# Patient Record
Sex: Male | Born: 1955 | Race: White | Hispanic: No | Marital: Single | Smoking: Former smoker
Health system: Southern US, Community
[De-identification: ages and names within clinical notes are randomized; demographics above are authoritative.]

## PROBLEM LIST (undated history)

## (undated) DIAGNOSIS — K429 Umbilical hernia without obstruction or gangrene: Secondary | ICD-10-CM

## (undated) DIAGNOSIS — J449 Chronic obstructive pulmonary disease, unspecified: Secondary | ICD-10-CM

## (undated) DIAGNOSIS — I10 Essential (primary) hypertension: Secondary | ICD-10-CM

---

## 1998-04-30 ENCOUNTER — Encounter (HOSPITAL_COMMUNITY): Admission: RE | Admit: 1998-04-30 | Discharge: 1998-07-29 | Payer: Self-pay | Admitting: Psychiatry

## 2009-01-01 ENCOUNTER — Emergency Department: Payer: Self-pay | Admitting: Emergency Medicine

## 2012-09-14 ENCOUNTER — Ambulatory Visit: Payer: Self-pay | Admitting: Unknown Physician Specialty

## 2012-09-19 LAB — PATHOLOGY REPORT

## 2015-08-24 ENCOUNTER — Other Ambulatory Visit: Payer: Self-pay | Admitting: Specialist

## 2015-08-24 DIAGNOSIS — J439 Emphysema, unspecified: Secondary | ICD-10-CM

## 2015-08-24 DIAGNOSIS — Z720 Tobacco use: Secondary | ICD-10-CM

## 2015-08-24 DIAGNOSIS — R0609 Other forms of dyspnea: Secondary | ICD-10-CM

## 2015-09-01 ENCOUNTER — Ambulatory Visit: Payer: BLUE CROSS/BLUE SHIELD

## 2015-09-11 ENCOUNTER — Ambulatory Visit
Admission: RE | Admit: 2015-09-11 | Discharge: 2015-09-11 | Disposition: A | Discharge status: Home / Self Care | Payer: BLUE CROSS/BLUE SHIELD | Source: Ambulatory Visit | Attending: Specialist | Admitting: Specialist

## 2015-09-11 DIAGNOSIS — J439 Emphysema, unspecified: Secondary | ICD-10-CM | POA: Insufficient documentation

## 2015-09-11 DIAGNOSIS — R0602 Shortness of breath: Secondary | ICD-10-CM | POA: Insufficient documentation

## 2015-09-11 DIAGNOSIS — Z720 Tobacco use: Secondary | ICD-10-CM

## 2015-09-11 DIAGNOSIS — J849 Interstitial pulmonary disease, unspecified: Secondary | ICD-10-CM | POA: Insufficient documentation

## 2015-09-11 DIAGNOSIS — I2510 Atherosclerotic heart disease of native coronary artery without angina pectoris: Secondary | ICD-10-CM | POA: Insufficient documentation

## 2015-09-11 DIAGNOSIS — R0609 Other forms of dyspnea: Secondary | ICD-10-CM

## 2015-09-11 DIAGNOSIS — I700 Atherosclerosis of aorta: Secondary | ICD-10-CM | POA: Diagnosis not present

## 2015-09-11 DIAGNOSIS — K760 Fatty (change of) liver, not elsewhere classified: Secondary | ICD-10-CM | POA: Insufficient documentation

## 2015-12-28 ENCOUNTER — Ambulatory Visit: Payer: BLUE CROSS/BLUE SHIELD | Attending: Otolaryngology

## 2015-12-28 DIAGNOSIS — G4733 Obstructive sleep apnea (adult) (pediatric): Secondary | ICD-10-CM | POA: Insufficient documentation

## 2015-12-28 DIAGNOSIS — G4710 Hypersomnia, unspecified: Secondary | ICD-10-CM | POA: Diagnosis not present

## 2015-12-28 DIAGNOSIS — G4761 Periodic limb movement disorder: Secondary | ICD-10-CM | POA: Insufficient documentation

## 2015-12-28 DIAGNOSIS — R0683 Snoring: Secondary | ICD-10-CM | POA: Insufficient documentation

## 2020-05-31 ENCOUNTER — Other Ambulatory Visit: Payer: Self-pay

## 2020-05-31 ENCOUNTER — Encounter: Payer: Self-pay | Admitting: Emergency Medicine

## 2020-05-31 ENCOUNTER — Inpatient Hospital Stay
Admission: EM | Admit: 2020-05-31 | Discharge: 2020-06-05 | DRG: 433 | Disposition: A | Discharge status: Home / Self Care | Payer: Medicare Other | Attending: Family Medicine | Admitting: Family Medicine

## 2020-05-31 ENCOUNTER — Emergency Department: Payer: Medicare Other

## 2020-05-31 DIAGNOSIS — Z87891 Personal history of nicotine dependence: Secondary | ICD-10-CM

## 2020-05-31 DIAGNOSIS — I10 Essential (primary) hypertension: Secondary | ICD-10-CM

## 2020-05-31 DIAGNOSIS — R188 Other ascites: Secondary | ICD-10-CM | POA: Diagnosis present

## 2020-05-31 DIAGNOSIS — M810 Age-related osteoporosis without current pathological fracture: Secondary | ICD-10-CM | POA: Diagnosis present

## 2020-05-31 DIAGNOSIS — K7460 Unspecified cirrhosis of liver: Secondary | ICD-10-CM | POA: Diagnosis not present

## 2020-05-31 DIAGNOSIS — R0602 Shortness of breath: Secondary | ICD-10-CM

## 2020-05-31 DIAGNOSIS — J9611 Chronic respiratory failure with hypoxia: Secondary | ICD-10-CM | POA: Diagnosis present

## 2020-05-31 DIAGNOSIS — J449 Chronic obstructive pulmonary disease, unspecified: Secondary | ICD-10-CM | POA: Diagnosis not present

## 2020-05-31 DIAGNOSIS — K766 Portal hypertension: Secondary | ICD-10-CM | POA: Diagnosis present

## 2020-05-31 DIAGNOSIS — Z9189 Other specified personal risk factors, not elsewhere classified: Secondary | ICD-10-CM

## 2020-05-31 DIAGNOSIS — K429 Umbilical hernia without obstruction or gangrene: Secondary | ICD-10-CM | POA: Diagnosis present

## 2020-05-31 DIAGNOSIS — Z86010 Personal history of colonic polyps: Secondary | ICD-10-CM

## 2020-05-31 DIAGNOSIS — E8770 Fluid overload, unspecified: Secondary | ICD-10-CM | POA: Diagnosis present

## 2020-05-31 DIAGNOSIS — R109 Unspecified abdominal pain: Secondary | ICD-10-CM | POA: Diagnosis not present

## 2020-05-31 DIAGNOSIS — E876 Hypokalemia: Secondary | ICD-10-CM | POA: Diagnosis present

## 2020-05-31 DIAGNOSIS — Z8719 Personal history of other diseases of the digestive system: Secondary | ICD-10-CM

## 2020-05-31 DIAGNOSIS — Z6839 Body mass index (BMI) 39.0-39.9, adult: Secondary | ICD-10-CM

## 2020-05-31 DIAGNOSIS — E669 Obesity, unspecified: Secondary | ICD-10-CM | POA: Diagnosis present

## 2020-05-31 DIAGNOSIS — Z79899 Other long term (current) drug therapy: Secondary | ICD-10-CM

## 2020-05-31 DIAGNOSIS — K5900 Constipation, unspecified: Secondary | ICD-10-CM | POA: Diagnosis present

## 2020-05-31 DIAGNOSIS — Z20822 Contact with and (suspected) exposure to covid-19: Secondary | ICD-10-CM | POA: Diagnosis present

## 2020-05-31 DIAGNOSIS — M7989 Other specified soft tissue disorders: Secondary | ICD-10-CM

## 2020-05-31 DIAGNOSIS — R140 Abdominal distension (gaseous): Secondary | ICD-10-CM

## 2020-05-31 DIAGNOSIS — Z882 Allergy status to sulfonamides status: Secondary | ICD-10-CM

## 2020-05-31 DIAGNOSIS — K760 Fatty (change of) liver, not elsewhere classified: Secondary | ICD-10-CM | POA: Diagnosis present

## 2020-05-31 HISTORY — DX: Chronic obstructive pulmonary disease, unspecified: J44.9

## 2020-05-31 HISTORY — DX: Essential (primary) hypertension: I10

## 2020-05-31 HISTORY — DX: Umbilical hernia without obstruction or gangrene: K42.9

## 2020-05-31 LAB — CBC
HCT: 44.5 % (ref 39.0–52.0)
Hemoglobin: 14.5 g/dL (ref 13.0–17.0)
MCH: 28.9 pg (ref 26.0–34.0)
MCHC: 32.6 g/dL (ref 30.0–36.0)
MCV: 88.8 fL (ref 80.0–100.0)
Platelets: 270 10*3/uL (ref 150–400)
RBC: 5.01 MIL/uL (ref 4.22–5.81)
RDW: 13.6 % (ref 11.5–15.5)
WBC: 9.8 10*3/uL (ref 4.0–10.5)
nRBC: 0 % (ref 0.0–0.2)

## 2020-05-31 LAB — COMPREHENSIVE METABOLIC PANEL
ALT: 61 U/L — ABNORMAL HIGH (ref 0–44)
AST: 39 U/L (ref 15–41)
Albumin: 3.6 g/dL (ref 3.5–5.0)
Alkaline Phosphatase: 66 U/L (ref 38–126)
Anion gap: 14 (ref 5–15)
BUN: 14 mg/dL (ref 8–23)
CO2: 33 mmol/L — ABNORMAL HIGH (ref 22–32)
Calcium: 8.9 mg/dL (ref 8.9–10.3)
Chloride: 90 mmol/L — ABNORMAL LOW (ref 98–111)
Creatinine, Ser: 0.82 mg/dL (ref 0.61–1.24)
GFR, Estimated: >60 mL/min (ref >60)
Glucose, Bld: 104 mg/dL — ABNORMAL HIGH (ref 70–99)
Potassium: 4.1 mmol/L (ref 3.5–5.1)
Sodium: 137 mmol/L (ref 135–145)
Total Bilirubin: 1.5 mg/dL — ABNORMAL HIGH (ref 0.3–1.2)
Total Protein: 7.2 g/dL (ref 6.5–8.1)

## 2020-05-31 LAB — URINALYSIS, COMPLETE (UACMP) WITH MICROSCOPIC
Bacteria, UA: NONE SEEN
Bilirubin Urine: NEGATIVE
Glucose, UA: NEGATIVE mg/dL
Hgb urine dipstick: NEGATIVE
Ketones, ur: 80 mg/dL — AB
Leukocytes,Ua: NEGATIVE
Nitrite: NEGATIVE
Protein, ur: NEGATIVE mg/dL
Specific Gravity, Urine: >1.046 — ABNORMAL HIGH (ref 1.005–1.030)
Squamous Epithelial / HPF: NONE SEEN (ref 0–5)
pH: 6 (ref 5.0–8.0)

## 2020-05-31 LAB — LIPASE, BLOOD: Lipase: 24 U/L (ref 11–51)

## 2020-05-31 MED ORDER — SODIUM CHLORIDE 0.9% FLUSH
3.0000 mL | Freq: Two times a day (BID) | INTRAVENOUS | Status: DC
  Administered 2020-05-31 – 2020-06-04 (×8): 3 mL via INTRAVENOUS

## 2020-05-31 MED ORDER — SODIUM CHLORIDE 0.9% FLUSH: 3.0000 mL | INTRAVENOUS | Status: DC | PRN

## 2020-05-31 MED ORDER — ALBUMIN HUMAN 25 % IV SOLN: 25.0000 g | Freq: Every day | INTRAVENOUS | Status: DC | PRN

## 2020-05-31 MED ORDER — TIOTROPIUM BROMIDE MONOHYDRATE 18 MCG IN CAPS
1.0000 | ORAL_CAPSULE | Freq: Every day | RESPIRATORY_TRACT | Status: DC
  Administered 2020-06-02 – 2020-06-04 (×3): 18 ug via RESPIRATORY_TRACT
  Filled 2020-05-31: qty 5

## 2020-05-31 MED ORDER — ATENOLOL 25 MG PO TABS
25.0000 mg | ORAL_TABLET | Freq: Two times a day (BID) | ORAL | Status: DC
  Administered 2020-05-31 – 2020-06-04 (×8): 25 mg via ORAL
  Filled 2020-05-31 (×10): qty 1

## 2020-05-31 MED ORDER — ENOXAPARIN SODIUM 60 MG/0.6ML ~~LOC~~ SOLN
60.0000 mg | SUBCUTANEOUS | Status: DC
  Administered 2020-05-31 – 2020-06-01 (×2): 60 mg via SUBCUTANEOUS
  Filled 2020-05-31 (×2): qty 0.6

## 2020-05-31 MED ORDER — SODIUM CHLORIDE 0.9 % IV SOLN: 250.0000 mL | INTRAVENOUS | Status: DC | PRN

## 2020-05-31 MED ORDER — SPIRONOLACTONE 25 MG PO TABS
50.0000 mg | ORAL_TABLET | Freq: Every day | ORAL | Status: DC
  Administered 2020-05-31: 50 mg via ORAL
  Filled 2020-05-31 (×2): qty 2

## 2020-05-31 MED ORDER — ALBUMIN HUMAN 25 % IV SOLN
25.0000 g | Freq: Every day | INTRAVENOUS | Status: DC | PRN
  Filled 2020-05-31: qty 100

## 2020-05-31 MED ORDER — ALBUTEROL SULFATE (2.5 MG/3ML) 0.083% IN NEBU
2.5000 mg | INHALATION_SOLUTION | Freq: Four times a day (QID) | RESPIRATORY_TRACT | Status: DC | PRN
  Filled 2020-05-31 (×2): qty 3

## 2020-05-31 MED ORDER — FUROSEMIDE 10 MG/ML IJ SOLN
40.0000 mg | Freq: Once | INTRAMUSCULAR | Status: AC
  Administered 2020-05-31: 40 mg via INTRAVENOUS
  Filled 2020-05-31: qty 4

## 2020-05-31 MED ORDER — LACTULOSE 10 GM/15ML PO SOLN
20.0000 g | Freq: Two times a day (BID) | ORAL | Status: DC
  Administered 2020-05-31 – 2020-06-05 (×9): 20 g via ORAL
  Filled 2020-05-31 (×10): qty 30

## 2020-05-31 MED ORDER — ACETAMINOPHEN 325 MG PO TABS
650.0000 mg | ORAL_TABLET | ORAL | Status: DC | PRN
  Administered 2020-06-02 – 2020-06-04 (×4): 650 mg via ORAL
  Filled 2020-05-31 (×5): qty 2

## 2020-05-31 MED ORDER — ONDANSETRON HCL 4 MG/2ML IJ SOLN
4.0000 mg | Freq: Four times a day (QID) | INTRAMUSCULAR | Status: DC | PRN
  Administered 2020-06-02 – 2020-06-05 (×4): 4 mg via INTRAVENOUS
  Filled 2020-05-31 (×4): qty 2

## 2020-05-31 MED ORDER — ALBUTEROL SULFATE HFA 108 (90 BASE) MCG/ACT IN AERS: 2.0000 | INHALATION_SPRAY | Freq: Four times a day (QID) | RESPIRATORY_TRACT | Status: DC | PRN

## 2020-05-31 MED ORDER — IOHEXOL 300 MG/ML  SOLN
125.0000 mL | Freq: Once | INTRAMUSCULAR | Status: AC | PRN
  Administered 2020-05-31: 125 mL via INTRAVENOUS

## 2020-05-31 MED ORDER — ASPIRIN EC 81 MG PO TBEC
81.0000 mg | DELAYED_RELEASE_TABLET | Freq: Every day | ORAL | Status: DC
  Administered 2020-05-31 – 2020-06-05 (×5): 81 mg via ORAL
  Filled 2020-05-31 (×5): qty 1

## 2020-05-31 NOTE — ED Triage Notes (Signed)
Pt in via EMS from home with c/o abd pain for the last few weeks worsening today. Pt tender to left upper quad. Pt reports stopped all medications a week ago as well.

## 2020-05-31 NOTE — H&P (Signed)
History and Physical   Seth Haley WPY:[id number] DOB: [birth year] DOA: 05/31/2020  PCP: Simonne Martinet, NP  Outpatient Specialists: Dr. Meredeth Ide, pulmonology; Dr. Juliann Pares, cardiology Patient coming from: Home  I have personally briefly reviewed patient's old medical records in [facility] EMR.  Chief Concern: Abdominal discomfort and distention  HPI: Seth Haley is a 65 y.o. male with medical history significant for hypertension, COPD, history of tobacco use, history of EtOH use, quit 2 years ago, obesity, presents to the emergency department for chief concerns of abdominal pain.  He states that his abdomen has been bothering him for the last 2 to 3 weeks.  He endorses distention and poor appetite.  He states that he stopped taking his medications in the last week due to poor appetite and felt that it was making him retain more fluid.  He endorses weight gain and bilateral lower extremity leg swelling and this has been ongoing for about 1 year.  Reports that in the last year he has been eating more however states that is not necessarily more unhealthy from his baseline diet.  He states that he quit working completely and drinking alcohol completely 2 years ago when he was diagnosed with COPD.  He denies fever, nausea, vomiting, chest pain, shortness of breath, dysuria, diarrhea.  He states that previously, he had a bowel movement every day and in the last year, he has been having BM twice per week.  He states that it is very difficult to have a bowel movement and he has to strain constantly.  Denies blood in his stool and melena.  General health maintenance: He has had one colonoscopy when he was 49 years.  Social history: former etoh use (4-5 beers on Wednesday and weekend etoh use), he completely quit etoh use and tobacco use 2 years ago.    Vaccinations: he only received one dose of pfizer and didn't get a second dose because the location was too far away (30 miles).    ROS: Constitutional: no weight change, no fever ENT/Mouth: no sore throat, no rhinorrhea Eyes: no eye pain, no vision changes Cardiovascular: no chest pain, no dyspnea,  no edema, no palpitations Respiratory: no cough, no sputum, no wheezing Gastrointestinal: no nausea, no vomiting, no diarrhea, no constipation Genitourinary: no urinary incontinence, no dysuria, no hematuria Musculoskeletal: no arthralgias, no myalgias Skin: no skin lesions, no pruritus, Neuro: + weakness, no loss of consciousness, no syncope Psych: no anxiety, no depression, + decrease appetite Heme/Lymph: no bruising, no bleeding  ED Course: Discussed with ED provider, patient requiring hospitalization for abdominal ascites.  Vitals in the emergency department was remarkable for temperature of 99, respiration rate of 18, heart rate 99, SPO2 of 98% on 2 L nasal cannula.  Blood pressure 146/92.  EDP ordered a CT abdomen pelvis.  Osteoporosis with portal venous hypertension, splenomegaly, and moderate large volume abdominal ascites.  Assessment/Plan  Principal Problem:   Ascites Active Problems:   Essential hypertension   COPD with chronic bronchitis (HCC)   Obesity, Class III, BMI 40-49.9 (morbid obesity) (HCC)   At risk for obstructive sleep apnea   Cirrhosis of liver with ascites (HCC)   Abdominal pain-suspect secondary to abdominal distention in setting of new diagnosis of liver cirrhosis with ascites Portal venous hypertension - Previously he drank probably 10-15 drinks per week, quit 2 years ago with quitting smoking - Increase p.o. intake in the last 2 years during the pandemic after he quit smoking and drinking - Ultrasound-guided paracentesis  ordered up to 5 L, with labs, with albumin as needed -Resumed home spironolactone 50 mg daily -Lasix 40 mg IV once -Lactulose 20 g p.o. twice daily initiated -A.m. team to consult GI in the a.m.  Bilateral lower extremity swelling-multifactorial including  liver cirrhosis however his cardiologist had recommended he get an echocardiogram however this has not been done - Complete echo ordered  Hypertension- resumed home atenolol 25 mg p.o. twice daily, spironolactone 50 mg daily  At risk for obstructive sleep apnea- CPAP nightly ordered  Constipation-lactulose 20 g p.o. twice daily started  As needed medications: Acetaminophen, ondansetron, albumin  Chart reviewed.   DVT prophylaxis: Enoxaparin 60 mg subcutaneous every 24 hours Code Status: Full code Diet: Heart healthy Family Communication: No Disposition Plan: Pending clinical course Consults called: IR for paracentesis Admission status: Observation, MedSurg, with telemetry  Past Medical History:  Diagnosis Date  . COPD (chronic obstructive pulmonary disease) (HCC)   . Hypertension   . Umbilical hernia    History reviewed. No pertinent surgical history.  Social History:  reports that he has quit smoking. He has never used smokeless tobacco. He reports previous alcohol use. He reports previous drug use.  Allergies  Allergen Reactions  . Sulfa Antibiotics Other (See Comments)   Family history: Family history reviewed and not pertinent  Prior to Admission medications   Not on File   Physical Exam: Vitals:   05/31/20 1518 05/31/20 1520 05/31/20 1630 05/31/20 1730  BP:   120/84 (!) 146/92  Pulse:   100 100  Resp:   18 19  Temp:  99 F (37.2 C)    TempSrc:  Oral    SpO2:   98% 98%  Weight: 121.6 kg     Height: 5\' 7"  (1.702 m)      Constitutional: appears age-appropriate, NAD, calm, comfortable Eyes: PERRL, lids and conjunctivae normal ENMT: Mucous membranes are moist. Posterior pharynx clear of any exudate or lesions. Age-appropriate dentition. Hearing appropriate Neck: normal, supple, no masses, no thyromegaly Respiratory: clear to auscultation bilaterally, no wheezing, no crackles. Normal respiratory effort. No accessory muscle use.  Cardiovascular: Regular rate  and rhythm, no murmurs / rubs / gallops.  Bilateral lower extremity swelling with pitting edema, 2+. 2+ pedal pulses. No carotid bruits.  Abdomen: Obese abdomen, distended, minimal tenderness, no masses palpated, no hepatosplenomegaly. Bowel sounds positive.  Musculoskeletal: no clubbing / cyanosis. No joint deformity upper and lower extremities. Good ROM, no contractures, no atrophy. Normal muscle tone.  Skin: no rashes, lesions, ulcers. No induration Neurologic: Sensation intact. Strength 5/5 in all 4.  Psychiatric: Normal judgment and insight. Alert and oriented x 3. Normal mood.   EKG: independently reviewed, showing sinus tachycardia with rate of 104, QTc 436  Chest x-ray on Admission: I personally reviewed and I agree with radiologist reading as below.  CT ABDOMEN PELVIS W CONTRAST  Result Date: 05/31/2020 CLINICAL DATA:  Abdominal distension for 6 weeks. EXAM: CT ABDOMEN AND PELVIS WITH CONTRAST TECHNIQUE: Multidetector CT imaging of the abdomen and pelvis was performed using the standard protocol following bolus administration of intravenous contrast. CONTRAST:  06/02/2020 OMNIPAQUE IOHEXOL 300 MG/ML  SOLN COMPARISON:  None. FINDINGS: Lower chest: There is a small right pleural effusion with minimal overlying atelectasis. The heart is normal in size. No pericardial effusion. Hepatobiliary: Contour irregularity of the liver and prominent hepatic fissures suspicious for cirrhosis. The portal and hepatic veins are patent. The splenic vein is patent. No worrisome hepatic lesions or intrahepatic biliary dilatation. The gallbladder  demonstrates scattered gallstones but no findings to suggest acute cholecystitis. Normal caliber common bile duct. Pancreas: No mass, inflammation or ductal dilatation. Spleen: Mild splenomegaly. Spleen measures 14.5 x 13.0 x 9.5 cm. No splenic lesions. Adrenals/Urinary Tract: Adrenal glands and kidneys are unremarkable. Simple right renal cyst noted. No renal calculi or bladder  calculi. Stomach/Bowel: The stomach, duodenum, small bowel and colon are grossly normal. No acute inflammatory process, mass lesions or obstructive findings. Descending colon and sigmoid colon diverticulosis without findings for acute diverticulitis. The terminal ileum is normal. The appendix is not identified for certain but no findings to suggest appendicitis. Vascular/Lymphatic: Significant age advanced atherosclerotic calcifications involving the aorta and branch vessels. There is a 5.1 x 4.7 cm infrarenal abdominal aortic aneurysm with moderate mural thrombus. This ends just above the iliac artery bifurcation. The major venous structures are patent. No mesenteric or retroperitoneal mass or adenopathy. Reproductive: The prostate gland and seminal vesicles are unremarkable. Other: Moderate to large volume abdominal/pelvic ascites. I do not see any obvious omental or peritoneal surface lesions. Small right inguinal hernia containing fat. Moderate-sized periumbilical abdominal wall hernia containing fat and a small bowel loop no findings for obstruction or incarceration. Musculoskeletal: No significant bony findings. IMPRESSION: 1. CT findings suspicious for cirrhosis with portal venous hypertension, splenomegaly and moderate to large volume abdominal/pelvic ascites. 2. No worrisome hepatic lesions. 3. Periumbilical abdominal wall hernia containing fat and a small bowel loop but no findings for incarceration or obstruction. 4. Cholelithiasis without findings to suggest acute cholecystitis. 5. 5.1 x 4.7 cm infrarenal abdominal aortic aneurysm. Recommend follow-up CT/MR every 6 months and vascular consultation. This recommendation follows ACR consensus guidelines: White Paper of the ACR Incidental Findings Committee II on Vascular Findings. J Am Coll Radiol 2013; 10:789-794. 6. Age advanced atherosclerotic calcifications involving the aorta and branch vessels. 7. Small right pleural effusion with minimal overlying  atelectasis. 8. Aortic atherosclerosis. Aortic Atherosclerosis (ICD10-I70.0). Electronically Signed   By: Rudie Meyer M.D.   On: 05/31/2020 17:38   Labs on Admission: I have personally reviewed following labs  CBC: Recent Labs  Lab 05/31/20 1520  WBC 9.8  HGB 14.5  HCT 44.5  MCV 88.8  PLT 270   Basic Metabolic Panel: Recent Labs  Lab 05/31/20 1520  NA 137  K 4.1  CL 90*  CO2 33*  GLUCOSE 104*  BUN 14  CREATININE 0.82  CALCIUM 8.9   GFR: Estimated Creatinine Clearance: 112.2 mL/min (by C-G formula based on SCr of 0.82 mg/dL).  Liver Function Tests: Recent Labs  Lab 05/31/20 1520  AST 39  ALT 61*  ALKPHOS 66  BILITOT 1.5*  PROT 7.2  ALBUMIN 3.6   Recent Labs  Lab 05/31/20 1520  LIPASE 24   Nelia Rogoff N Sura Canul D.O. [facility]  If 7PM-7AM, please contact overnight-coverage provider If 7AM-7PM, please contact day coverage provider [email or web address]  05/31/2020, 7:12 PM

## 2020-05-31 NOTE — ED Provider Notes (Signed)
[facility] Emergency Department Provider Note   ____________________________________________    I have reviewed the triage vital signs and the nursing notes.   HISTORY  Chief Complaint Abdominal Pain     HPI Seth Haley is a 65 y.o. male with history of COPD, umbilical hernia who presents with complaints of abdominal distention.  Patient reports his belly feels more tight than typical and his umbilical hernia seems to be worsening.  He does have a history of COPD and reports that his breathing is more labored because of how large his abdomen is.  He thinks this could be related to significant weight gain during the pandemic.  Denies a history of liver disease.  He also reports that over the last week he has stopped taking his blood pressure medication and spironolactone.  Patient also reports he has not had any appetite over the last week  Past Medical History:  Diagnosis Date  . COPD (chronic obstructive pulmonary disease) (HCC)   . Hypertension   . Umbilical hernia     There are no problems to display for this patient.   History reviewed. No pertinent surgical history.  Prior to Admission medications   Not on File     Allergies Sulfa antibiotics  No family history on file.  Social History Social History   Tobacco Use  . Smoking status: Former Games developer  . Smokeless tobacco: Never Used  Substance Use Topics  . Alcohol use: Not Currently  . Drug use: Not Currently    Review of Systems  Constitutional: No fever/chills Eyes: No visual changes.  ENT: No sore throat. Cardiovascular: Denies chest pain. Respiratory: Denies shortness of breath. Gastrointestinal: As above Genitourinary: Negative for dysuria. Musculoskeletal: Negative for back pain. Skin: Negative for rash. Neurological: Negative for headaches or weakness   ____________________________________________   PHYSICAL EXAM:  VITAL SIGNS: ED Triage Vitals  Enc  Vitals Group     BP 05/31/20 1517 130/83     Pulse Rate 05/31/20 1517 (!) 105     Resp 05/31/20 1517 20     Temp 05/31/20 1520 99 F (37.2 C)     Temp Source 05/31/20 1517 Oral     SpO2 05/31/20 1517 97 %     Weight 05/31/20 1518 121.6 kg (268 lb)     Height 05/31/20 1518 1.702 m (5\' 7" )     Head Circumference --      Peak Flow --      Pain Score 05/31/20 1518 2     Pain Loc --      Pain Edu? --      Excl. in GC? --     Constitutional: Alert and oriented. No acute distress. Pleasant and interactive  Nose: No congestion/rhinnorhea. Mouth/Throat: Mucous membranes are moist.    Cardiovascular: Normal rate, regular rhythm. Grossly normal heart sounds.  Good peripheral circulation. Respiratory: Normal respiratory effort.  No retractions. Lungs CTAB. Gastrointestinal: Large abdomen, overall soft and nontender no fluid wave, umbilical hernia, easily reducible  Musculoskeletal: Mild edema bilaterally, warm and well perfused Neurologic:  Normal speech and language. No gross focal neurologic deficits are appreciated.  Skin:  Skin is warm, dry and intact. No rash noted. Psychiatric: Mood and affect are normal. Speech and behavior are normal.  ____________________________________________   LABS (all labs ordered are listed, but only abnormal results are displayed)  Labs Reviewed  COMPREHENSIVE METABOLIC PANEL - Abnormal; Notable for the following components:      Result Value  Chloride 90 (*)    CO2 33 (*)    Glucose, Bld 104 (*)    ALT 61 (*)    Total Bilirubin 1.5 (*)    All other components within normal limits  SARS CORONAVIRUS 2 (TAT 6-24 HRS)  LIPASE, BLOOD  CBC  URINALYSIS, COMPLETE (UACMP) WITH MICROSCOPIC   ____________________________________________  EKG  ED ECG REPORT I, Jene Every, the attending physician, personally viewed and interpreted this ECG.  Date: 05/31/2020  Rhythm: Sinus tachycardia QRS Axis: normal Intervals: normal ST/T Wave  abnormalities: normal Narrative Interpretation: no evidence of acute ischemia  ____________________________________________  RADIOLOGY  None ____________________________________________   PROCEDURES  Procedure(s) performed: No  Procedures   Critical Care performed: No ____________________________________________   INITIAL IMPRESSION / ASSESSMENT AND PLAN / ED COURSE  Pertinent labs & imaging results that were available during my care of the patient were reviewed by me and considered in my medical decision making (see chart for details).  Patient presents with abdominal distention, worsening umbilical hernia.  Hernia is easily reducible, no tenderness but he does note that his abdomen seems to have distended relatively quickly over the last several months.  Lab work is reassuring, mild elevation in ALT and total bili.  Will obtain CT abdomen pelvis to evaluate for possible ascites.  CT scan is consistent with likely liver cirrhosis and significant ascites  Given his history of COPD and shortness of breath, decreased p.o. intake with large volume ascites, will admit for paracentesis, GI consultation    ____________________________________________   FINAL CLINICAL IMPRESSION(S) / ED DIAGNOSES  Final diagnoses:  Cirrhosis of liver with ascites, unspecified hepatic cirrhosis type (HCC)        Note:  This document was prepared using Dragon voice recognition software and may include unintentional dictation errors.   Jene Every, MD 05/31/20 [phone]

## 2020-05-31 NOTE — ED Triage Notes (Signed)
Pt to ED via [facility] with c/o umbilical abd pain x 1.5 months. Pt also c/o COPD. Pt states has had umbilical hernia x 3 years. Pt c/o nause with the abdominal pain, also c/o decreased appetite. Pt states has not taken any of his home medications due to decreased appetite.

## 2020-06-01 ENCOUNTER — Observation Stay: Payer: Medicare Other

## 2020-06-01 ENCOUNTER — Observation Stay (HOSPITAL_BASED_OUTPATIENT_CLINIC_OR_DEPARTMENT_OTHER)
Admit: 2020-06-01 | Discharge: 2020-06-01 | Disposition: A | Discharge status: Home / Self Care | Payer: Medicare Other | Attending: Internal Medicine | Admitting: Internal Medicine

## 2020-06-01 ENCOUNTER — Encounter: Payer: Self-pay | Admitting: Internal Medicine

## 2020-06-01 DIAGNOSIS — Z20822 Contact with and (suspected) exposure to covid-19: Secondary | ICD-10-CM | POA: Diagnosis present

## 2020-06-01 DIAGNOSIS — R188 Other ascites: Secondary | ICD-10-CM

## 2020-06-01 DIAGNOSIS — Z87891 Personal history of nicotine dependence: Secondary | ICD-10-CM | POA: Diagnosis not present

## 2020-06-01 DIAGNOSIS — E669 Obesity, unspecified: Secondary | ICD-10-CM | POA: Diagnosis present

## 2020-06-01 DIAGNOSIS — Z882 Allergy status to sulfonamides status: Secondary | ICD-10-CM | POA: Diagnosis not present

## 2020-06-01 DIAGNOSIS — Z86010 Personal history of colonic polyps: Secondary | ICD-10-CM | POA: Diagnosis not present

## 2020-06-01 DIAGNOSIS — R109 Unspecified abdominal pain: Secondary | ICD-10-CM | POA: Diagnosis present

## 2020-06-01 DIAGNOSIS — R1084 Generalized abdominal pain: Secondary | ICD-10-CM | POA: Diagnosis not present

## 2020-06-01 DIAGNOSIS — K7460 Unspecified cirrhosis of liver: Secondary | ICD-10-CM | POA: Diagnosis present

## 2020-06-01 DIAGNOSIS — J449 Chronic obstructive pulmonary disease, unspecified: Secondary | ICD-10-CM | POA: Diagnosis present

## 2020-06-01 DIAGNOSIS — E8770 Fluid overload, unspecified: Secondary | ICD-10-CM | POA: Diagnosis present

## 2020-06-01 DIAGNOSIS — Z8719 Personal history of other diseases of the digestive system: Secondary | ICD-10-CM | POA: Diagnosis not present

## 2020-06-01 DIAGNOSIS — I10 Essential (primary) hypertension: Secondary | ICD-10-CM

## 2020-06-01 DIAGNOSIS — K760 Fatty (change of) liver, not elsewhere classified: Secondary | ICD-10-CM | POA: Diagnosis present

## 2020-06-01 DIAGNOSIS — K766 Portal hypertension: Secondary | ICD-10-CM | POA: Diagnosis present

## 2020-06-01 DIAGNOSIS — K429 Umbilical hernia without obstruction or gangrene: Secondary | ICD-10-CM | POA: Diagnosis present

## 2020-06-01 DIAGNOSIS — M810 Age-related osteoporosis without current pathological fracture: Secondary | ICD-10-CM | POA: Diagnosis present

## 2020-06-01 DIAGNOSIS — E876 Hypokalemia: Secondary | ICD-10-CM | POA: Diagnosis present

## 2020-06-01 DIAGNOSIS — J9611 Chronic respiratory failure with hypoxia: Secondary | ICD-10-CM | POA: Diagnosis present

## 2020-06-01 DIAGNOSIS — M7989 Other specified soft tissue disorders: Secondary | ICD-10-CM

## 2020-06-01 DIAGNOSIS — R0600 Dyspnea, unspecified: Secondary | ICD-10-CM

## 2020-06-01 DIAGNOSIS — Z6839 Body mass index (BMI) 39.0-39.9, adult: Secondary | ICD-10-CM | POA: Diagnosis not present

## 2020-06-01 DIAGNOSIS — Z9189 Other specified personal risk factors, not elsewhere classified: Secondary | ICD-10-CM | POA: Diagnosis not present

## 2020-06-01 DIAGNOSIS — Z79899 Other long term (current) drug therapy: Secondary | ICD-10-CM | POA: Diagnosis not present

## 2020-06-01 DIAGNOSIS — K5900 Constipation, unspecified: Secondary | ICD-10-CM | POA: Diagnosis present

## 2020-06-01 LAB — CBC WITH DIFFERENTIAL/PLATELET
Abs Immature Granulocytes: 0.04 10*3/uL (ref 0.00–0.07)
Basophils Absolute: 0 10*3/uL (ref 0.0–0.1)
Basophils Relative: 1 %
Eosinophils Absolute: 0.1 10*3/uL (ref 0.0–0.5)
Eosinophils Relative: 1 %
HCT: 42.4 % (ref 39.0–52.0)
Hemoglobin: 13.4 g/dL (ref 13.0–17.0)
Immature Granulocytes: 1 %
Lymphocytes Relative: 6 %
Lymphs Abs: 0.5 10*3/uL — ABNORMAL LOW (ref 0.7–4.0)
MCH: 28.7 pg (ref 26.0–34.0)
MCHC: 31.6 g/dL (ref 30.0–36.0)
MCV: 90.8 fL (ref 80.0–100.0)
Monocytes Absolute: 0.7 10*3/uL (ref 0.1–1.0)
Monocytes Relative: 8 %
Neutro Abs: 7.2 10*3/uL (ref 1.7–7.7)
Neutrophils Relative %: 83 %
Platelets: 237 10*3/uL (ref 150–400)
RBC: 4.67 MIL/uL (ref 4.22–5.81)
RDW: 13.5 % (ref 11.5–15.5)
WBC: 8.6 10*3/uL (ref 4.0–10.5)
nRBC: 0 % (ref 0.0–0.2)

## 2020-06-01 LAB — BASIC METABOLIC PANEL
Anion gap: 12 (ref 5–15)
BUN: 12 mg/dL (ref 8–23)
CO2: 37 mmol/L — ABNORMAL HIGH (ref 22–32)
Calcium: 8.7 mg/dL — ABNORMAL LOW (ref 8.9–10.3)
Chloride: 88 mmol/L — ABNORMAL LOW (ref 98–111)
Creatinine, Ser: 0.71 mg/dL (ref 0.61–1.24)
GFR, Estimated: >60 mL/min (ref >60)
Glucose, Bld: 113 mg/dL — ABNORMAL HIGH (ref 70–99)
Potassium: 3.6 mmol/L (ref 3.5–5.1)
Sodium: 137 mmol/L (ref 135–145)

## 2020-06-01 LAB — HEPATITIS PANEL, ACUTE
HCV Ab: NONREACTIVE
Hep A IgM: NONREACTIVE
Hep B C IgM: NONREACTIVE
Hepatitis B Surface Ag: NONREACTIVE

## 2020-06-01 LAB — ECHOCARDIOGRAM COMPLETE
AR max vel: 2.13 cm2
AV Area VTI: 2.36 cm2
AV Area mean vel: 2.27 cm2
AV Mean grad: 4 mmHg
AV Peak grad: 8.6 mmHg
Ao pk vel: 1.47 m/s
Area-P 1/2: 3.65 cm2
Height: 67 in
MV VTI: 2.72 cm2
S' Lateral: 3.5 cm
Weight: 4288 oz

## 2020-06-01 LAB — PROTEIN, PLEURAL OR PERITONEAL FLUID: Total protein, fluid: 4.8 g/dL

## 2020-06-01 LAB — BODY FLUID CELL COUNT WITH DIFFERENTIAL
Eos, Fluid: 1 %
Lymphs, Fluid: 34 %
Monocyte-Macrophage-Serous Fluid: 55 %
Neutrophil Count, Fluid: 10 %
Total Nucleated Cell Count, Fluid: 1408 cu mm

## 2020-06-01 LAB — ALBUMIN, PLEURAL OR PERITONEAL FLUID: Albumin, Fluid: 2.9 g/dL

## 2020-06-01 LAB — HIV ANTIBODY (ROUTINE TESTING W REFLEX): HIV Screen 4th Generation wRfx: NONREACTIVE

## 2020-06-01 LAB — SARS CORONAVIRUS 2 (TAT 6-24 HRS): SARS Coronavirus 2: NEGATIVE

## 2020-06-01 MED ORDER — SPIRONOLACTONE 25 MG PO TABS
100.0000 mg | ORAL_TABLET | Freq: Every day | ORAL | Status: DC
  Administered 2020-06-02 – 2020-06-03 (×2): 100 mg via ORAL
  Filled 2020-06-01 (×2): qty 4

## 2020-06-01 MED ORDER — PERFLUTREN LIPID MICROSPHERE
1.0000 mL | INTRAVENOUS | Status: AC | PRN
Start: 2020-06-01 — End: 2020-06-01
  Administered 2020-06-01: 3 mL via INTRAVENOUS
  Filled 2020-06-01: qty 10

## 2020-06-01 MED ORDER — ALBUTEROL SULFATE HFA 108 (90 BASE) MCG/ACT IN AERS
1.0000 | INHALATION_SPRAY | Freq: Four times a day (QID) | RESPIRATORY_TRACT | Status: DC | PRN
  Administered 2020-06-01: 2 via RESPIRATORY_TRACT
  Filled 2020-06-01: qty 6.7

## 2020-06-01 NOTE — ED Notes (Signed)
Informed RN bed assigned [phone]

## 2020-06-01 NOTE — Progress Notes (Signed)
PT Cancellation Note  Patient Details Name: Seth Haley MRN: [id number] DOB: 30-Dec-1955   Cancelled Treatment:    Reason Eval/Treat Not Completed: Patient at procedure or test/unavailable, will attempt to see pt at a future date/time as medically appropriate.     Ovidio Hanger PT, DPT 06/01/20, 10:41 AM

## 2020-06-01 NOTE — Progress Notes (Signed)
Pt states that he does not wear a CPAP at home and does not want to wear one here.

## 2020-06-01 NOTE — Consult Note (Signed)
Seth Darby, MD [address]  [place]  [place], [place] [address]  Main: [phone]  Fax: [phone] Pager: [phone]   Consultation  Referring Provider:     No ref. provider found Primary Care Physician:  Erick Colace, NP Primary Gastroenterologist: Althia Forts         Reason for Consultation:     Ascites, cirrhosis of liver  Date of Admission:  05/31/2020 Date of Consultation:  06/01/2020         HPI:   Seth Haley is a 65 y.o. male history of COPD presented to ER with progressively worsening abdominal distention and discomfort, ongoing for 1 and half months associated with bilateral swelling of legs.  Patient was hemodynamically stable in the ER.  His labs revealed normal CBC, normal platelets, mildly elevated AST and total bilirubin, BMP normal. Subsequently, patient underwent CT abdomen pelvis with contrast which revealed moderate to large volume ascites, cirrhosis of liver with portal hypertension, splenomegaly.  Patient underwent ultrasound-guided paracentesis today, 5 L of yellow straw-colored fluid removed.  Patient has history of intermittent alcohol use for more than 20 years.  He is on 2 L oxygen for underlying COPD.  GI is consulted for further management of cirrhosis and ascites  NSAIDs: None  Antiplts/Anticoagulants/Anti thrombotics: None  GI Procedures: Colonoscopy 09/17/2012 Diagnosis:  Part A: ASCENDING COLON POLYP HOT SNARE:  - SERRATED POLYP, FAVOR EARLY SESSILE SERRATED ADENOMA.  - NEGATIVE FOR HIGH GRADE DYSPLASIA AND MALIGNANCY.  .  Part B: TRANSVERSE COLON POLYP HOT SNARE:  - TUBULAR ADENOMA.  - NEGATIVE FOR HIGH GRADE DYSPLASIA AND MALIGNANCY.  .  Part C: TRANSVERSE COLON POLYP HOT SNARE:  - TUBULAR ADENOMA.  - NEGATIVE FOR HIGH GRADE DYSPLASIA AND MALIGNANCY.  .  Part D: RECTAL POLYP HOT SNARE:  - TUBULAR ADENOMA.  - NEGATIVE FOR HIGH GRADE DYSPLASIA AND MALIGNANCY.  Past Medical History:  Diagnosis Date  .  COPD (chronic obstructive pulmonary disease) ([place])   . Hypertension   . Umbilical hernia     History reviewed. No pertinent surgical history.  Prior to Admission medications   Medication Sig Start Date End Date Taking? Authorizing Provider  albuterol (PROVENTIL) (2.5 MG/3ML) 0.083% nebulizer solution Inhale 3 mLs into the lungs every 6 (six) hours as needed. 10/02/17  Yes [provider]  atenolol (TENORMIN) 25 MG tablet Take 25 mg by mouth 2 (two) times daily. 03/23/20  Yes [provider]  PROAIR HFA 108 (90 Base) MCG/ACT inhaler Inhale 2 puffs into the lungs every 6 (six) hours as needed for wheezing. 03/26/20  Yes [provider]  SPIRIVA HANDIHALER 18 MCG inhalation capsule Place 1 capsule into inhaler and inhale daily. 03/25/20  Yes [provider]  spironolactone (ALDACTONE) 50 MG tablet Take 50 mg by mouth daily. 12/26/19  Yes [provider]    Current Facility-Administered Medications:  .  0.9 %  sodium chloride infusion, 250 mL, Intravenous, PRN, Cox, Amy N, DO .  acetaminophen (TYLENOL) tablet 650 mg, 650 mg, Oral, Q4H PRN, Cox, Amy N, DO .  albumin human 25 % solution 25 g, 25 g, Intravenous, Daily PRN, Cox, Amy N, DO .  albuterol (PROVENTIL) (2.5 MG/3ML) 0.083% nebulizer solution 2.5 mg, 2.5 mg, Nebulization, Q6H PRN, Cox, Amy N, DO .  aspirin EC tablet 81 mg, 81 mg, Oral, Daily, Cox, Amy N, DO, 81 mg at 05/31/20 1952 .  atenolol (TENORMIN) tablet 25 mg, 25 mg, Oral, BID, Cox, Amy  N, DO, 25 mg at 05/31/20 2206 .  enoxaparin (LOVENOX) injection 60 mg, 60 mg, Subcutaneous, Q24H, Cox, Amy N, DO, 60 mg at 05/31/20 2207 .  lactulose (CHRONULAC) 10 GM/15ML solution 20 g, 20 g, Oral, BID, Cox, Amy N, DO, 20 g at 05/31/20 2206 .  ondansetron (ZOFRAN) injection 4 mg, 4 mg, Intravenous, Q6H PRN, Cox, Amy N, DO .  sodium chloride flush (NS) 0.9 % injection 3 mL, 3 mL, Intravenous, Q12H, Cox, Amy N, DO, 3 mL at 05/31/20 2207 .  sodium chloride  flush (NS) 0.9 % injection 3 mL, 3 mL, Intravenous, PRN, Cox, Amy N, DO .  [START ON 06/02/2020] spironolactone (ALDACTONE) tablet 100 mg, 100 mg, Oral, Daily, Khaniya Tenaglia, Tally Due, MD .  tiotropium [facility]) inhalation capsule ([facility] use ONLY) 18 mcg, 1 capsule, Inhalation, Daily, Cox, Amy N, DO  History reviewed. No pertinent family history.   Social History   Tobacco Use  . Smoking status: Former Research scientist (life sciences)  . Smokeless tobacco: Never Used  Substance Use Topics  . Alcohol use: Not Currently  . Drug use: Not Currently    Allergies as of 05/31/2020 - Review Complete 05/31/2020  Allergen Reaction Noted  . Sulfa antibiotics Other (See Comments) 09/24/2013    Review of Systems:    All systems reviewed and negative except where noted in HPI.   Physical Exam:  Vital signs in last 24 hours: Temp:  [98.2 F (36.8 C)-98.5 F (36.9 C)] 98.5 F (36.9 C) (04/25 1541) Pulse Rate:  [72-105] 73 (04/25 1541) Resp:  [14-18] 16 (04/25 1541) BP: (106-166)/(72-116) 120/80 (04/25 1541) SpO2:  [94 %-99 %] 97 % (04/25 1541) Last BM Date:  (several days) General:   Pleasant, cooperative in NAD Head:  Normocephalic and atraumatic. Eyes:   No icterus.   Conjunctiva pink. PERRLA. Ears:  Normal auditory acuity. Neck:  Supple; no masses or thyroidomegaly Lungs: Respirations even and unlabored. Lungs clear to auscultation bilaterally.   No wheezes, crackles, or rhonchi.  Heart:  Regular rate and rhythm;  Without murmur, clicks, rubs or gallops Abdomen:  Soft, grossly distended, dull to percussion, nontender. Normal bowel sounds. No appreciable masses or hepatomegaly.  No rebound or guarding.  Rectal:  Not performed. Msk:  Symmetrical without gross deformities.  Strength generalized weakness Extremities: 3+ edema, no cyanosis or clubbing. Neurologic:  Alert and oriented x3;  grossly normal neurologically. Skin:  Intact without significant lesions or rashes, scaly skin in bilateral lower extremities from  stasis dermatitis. Psych:  Alert and cooperative. Normal affect.  LAB RESULTS: CBC Latest Ref Rng & Units 06/01/2020 05/31/2020  WBC 4.0 - 10.5 K/uL 8.6 9.8  Hemoglobin 13.0 - 17.0 g/dL 13.4 14.5  Hematocrit 39.0 - 52.0 % 42.4 44.5  Platelets 150 - 400 K/uL 237 270    BMET BMP Latest Ref Rng & Units 06/01/2020 05/31/2020  Glucose 70 - 99 mg/dL 113(H) 104(H)  BUN 8 - 23 mg/dL 12 14  Creatinine 0.61 - 1.24 mg/dL 0.71 0.82  Sodium 135 - 145 mmol/L 137 137  Potassium 3.5 - 5.1 mmol/L 3.6 4.1  Chloride 98 - 111 mmol/L 88(L) 90(L)  CO2 22 - 32 mmol/L 37(H) 33(H)  Calcium 8.9 - 10.3 mg/dL 8.7(L) 8.9    LFT Hepatic Function Latest Ref Rng & Units 05/31/2020  Total Protein 6.5 - 8.1 g/dL 7.2  Albumin 3.5 - 5.0 g/dL 3.6  AST 15 - 41 U/L 39  ALT 0 - 44 U/L 61(H)  Alk Phosphatase 38 - 126  U/L 66  Total Bilirubin 0.3 - 1.2 mg/dL 1.5(H)     STUDIES: CT ABDOMEN PELVIS W CONTRAST  Result Date: 05/31/2020 CLINICAL DATA:  Abdominal distension for 6 weeks. EXAM: CT ABDOMEN AND PELVIS WITH CONTRAST TECHNIQUE: Multidetector CT imaging of the abdomen and pelvis was performed using the standard protocol following bolus administration of intravenous contrast. CONTRAST:  132m OMNIPAQUE IOHEXOL 300 MG/ML  SOLN COMPARISON:  None. FINDINGS: Lower chest: There is a small right pleural effusion with minimal overlying atelectasis. The heart is normal in size. No pericardial effusion. Hepatobiliary: Contour irregularity of the liver and prominent hepatic fissures suspicious for cirrhosis. The portal and hepatic veins are patent. The splenic vein is patent. No worrisome hepatic lesions or intrahepatic biliary dilatation. The gallbladder demonstrates scattered gallstones but no findings to suggest acute cholecystitis. Normal caliber common bile duct. Pancreas: No mass, inflammation or ductal dilatation. Spleen: Mild splenomegaly. Spleen measures 14.5 x 13.0 x 9.5 cm. No splenic lesions. Adrenals/Urinary Tract:  Adrenal glands and kidneys are unremarkable. Simple right renal cyst noted. No renal calculi or bladder calculi. Stomach/Bowel: The stomach, duodenum, small bowel and colon are grossly normal. No acute inflammatory process, mass lesions or obstructive findings. Descending colon and sigmoid colon diverticulosis without findings for acute diverticulitis. The terminal ileum is normal. The appendix is not identified for certain but no findings to suggest appendicitis. Vascular/Lymphatic: Significant age advanced atherosclerotic calcifications involving the aorta and branch vessels. There is a 5.1 x 4.7 cm infrarenal abdominal aortic aneurysm with moderate mural thrombus. This ends just above the iliac artery bifurcation. The major venous structures are patent. No mesenteric or retroperitoneal mass or adenopathy. Reproductive: The prostate gland and seminal vesicles are unremarkable. Other: Moderate to large volume abdominal/pelvic ascites. I do not see any obvious omental or peritoneal surface lesions. Small right inguinal hernia containing fat. Moderate-sized periumbilical abdominal wall hernia containing fat and a small bowel loop no findings for obstruction or incarceration. Musculoskeletal: No significant bony findings. IMPRESSION: 1. CT findings suspicious for cirrhosis with portal venous hypertension, splenomegaly and moderate to large volume abdominal/pelvic ascites. 2. No worrisome hepatic lesions. 3. Periumbilical abdominal wall hernia containing fat and a small bowel loop but no findings for incarceration or obstruction. 4. Cholelithiasis without findings to suggest acute cholecystitis. 5. 5.1 x 4.7 cm infrarenal abdominal aortic aneurysm. Recommend follow-up CT/MR every 6 months and vascular consultation. This recommendation follows ACR consensus guidelines: White Paper of the ACR Incidental Findings Committee II on Vascular Findings. J Am Coll Radiol 2013; 10:789-794. 6. Age advanced atherosclerotic  calcifications involving the aorta and branch vessels. 7. Small right pleural effusion with minimal overlying atelectasis. 8. Aortic atherosclerosis. Aortic Atherosclerosis (ICD10-I70.0). Electronically Signed   By: PMarijo SanesM.D.   On: 05/31/2020 17:38   [place]acentesis  Result Date: 06/01/2020 INDICATION: Patient with history of hypertension and former alcohol use. Presented to the emergency department with abdominal pain and distension. Request is for therapeutic and diagnostic paracentesis. Maximum of 5 L. EXAM: ULTRASOUND GUIDED THERAPEUTIC AND DIAGNOSTIC PARACENTESIS MEDICATIONS: Lidocaine 1% 10 mL COMPLICATIONS: None immediate. PROCEDURE: Informed written consent was obtained from the patient after a discussion of the risks, benefits and alternatives to treatment. A timeout was performed prior to the initiation of the procedure. Initial ultrasound scanning demonstrates a large amount of ascites within the right lower abdominal quadrant. The right lower abdomen was prepped and draped in the usual sterile fashion. 1% lidocaine was used for local anesthesia. Following this, a 19 gauge, 7-cm, YTeressa Lower  catheter was introduced. An ultrasound image was saved for documentation purposes. The paracentesis was performed. The catheter was removed and a dressing was applied. The patient tolerated the procedure well without immediate post procedural complication. FINDINGS: A total of approximately 5 L of straw-colored fluid was removed. Samples were sent to the laboratory as requested by the clinical team. IMPRESSION: Successful ultrasound-guided therapeutic and diagnostic paracentesis yielding 5 L liters of peritoneal fluid. Read by: Rushie Nyhan, NP Electronically Signed   By: Sandi Mariscal M.D.   On: 06/01/2020 10:39   DG Chest Port 1 View  Result Date: 06/01/2020 CLINICAL DATA:  Umbilical abdominal pain for 1.5 months. History of COPD. EXAM: PORTABLE CHEST 1 VIEW COMPARISON:  CT 09/11/2015 FINDINGS: Heart  size and pulmonary vascularity are normal for technique. No airspace disease or consolidation in the lungs. Small right pleural effusion with basilar atelectasis. No pneumothorax. Mediastinal contours appear intact. IMPRESSION: Small right pleural effusion with basilar atelectasis. Electronically Signed   By: Lucienne Capers M.D.   On: 06/01/2020 00:24   ECHOCARDIOGRAM COMPLETE  Result Date: 06/01/2020    ECHOCARDIOGRAM REPORT   Patient Name:   Seth Haley Date of Exam: 06/01/2020 Medical Rec #:  [id number]          Height:       67.0 in Accession #:    [id number]         Weight:       268.0 lb Date of Birth:  [birth year]         BSA:          2.290 m Patient Age:    58 years           BP:           146/83 mmHg Patient Gender: M                  HR:           68 bpm. Exam Location:  [facility] Procedure: Color Doppler, Cardiac Doppler and Intracardiac Opacification Agent Indications:     R06.00 Dyspnea  History:         Patient has no prior history of Echocardiogram examinations.                  COPD; Risk Factors:Hypertension.  Sonographer:     Charmayne Sheer RDCS (AE) Referring Phys:  [id number] AMY N COX Diagnosing Phys: Serafina Royals MD  Sonographer Comments: Technically difficult study due to poor echo windows. Image acquisition challenging due to patient body habitus and Image acquisition challenging due to COPD. IMPRESSIONS  1. Left ventricular ejection fraction, by estimation, is 65 to 70%. The left ventricle has normal function. The left ventricle has no regional wall motion abnormalities. Left ventricular diastolic parameters were normal.  2. Right ventricular systolic function is normal. The right ventricular size is normal.  3. The mitral valve is normal in structure. Trivial mitral valve regurgitation.  4. The aortic valve is normal in structure. Aortic valve regurgitation is not visualized. FINDINGS  Left Ventricle: Left ventricular ejection fraction, by estimation, is 65 to 70%. The left ventricle has  normal function. The left ventricle has no regional wall motion abnormalities. Definity contrast agent was given IV to delineate the left ventricular  endocardial borders. The left ventricular internal cavity size was small. There is no left ventricular hypertrophy. Left ventricular diastolic parameters were normal. Right Ventricle: The right ventricular size is normal. No increase in right ventricular wall thickness. Right  ventricular systolic function is normal. Left Atrium: Left atrial size was normal in size. Right Atrium: Right atrial size was normal in size. Pericardium: There is no evidence of pericardial effusion. Mitral Valve: The mitral valve is normal in structure. Trivial mitral valve regurgitation. MV peak gradient, 2.1 mmHg. The mean mitral valve gradient is 1.0 mmHg. Tricuspid Valve: The tricuspid valve is normal in structure. Tricuspid valve regurgitation is trivial. Aortic Valve: The aortic valve is normal in structure. Aortic valve regurgitation is not visualized. Aortic valve mean gradient measures 4.0 mmHg. Aortic valve peak gradient measures 8.6 mmHg. Aortic valve area, by VTI measures 2.36 cm. Pulmonic Valve: The pulmonic valve was normal in structure. Pulmonic valve regurgitation is not visualized. Aorta: The aortic root and ascending aorta are structurally normal, with no evidence of dilitation. IAS/Shunts: No atrial level shunt detected by color flow Doppler.  LEFT VENTRICLE PLAX 2D LVIDd:         4.80 cm  Diastology LVIDs:         3.50 cm  LV e' medial:    7.62 cm/s LV PW:         1.10 cm  LV E/e' medial:  6.3 LV IVS:        1.00 cm  LV e' lateral:   7.83 cm/s LVOT diam:     2.20 cm  LV E/e' lateral: 6.1 LV SV:         56 LV SV Index:   24 LVOT Area:     3.80 cm  LEFT ATRIUM           Index LA diam:      2.60 cm 1.14 cm/m LA Vol (A4C): 31.8 ml 13.89 ml/m  AORTIC VALVE                   PULMONIC VALVE AV Area (Vmax):    2.13 cm    PV Vmax:       1.03 m/s AV Area (Vmean):   2.27 cm     PV Vmean:      67.000 cm/s AV Area (VTI):     2.36 cm    PV VTI:        0.177 m AV Vmax:           147.00 cm/s PV Peak grad:  4.2 mmHg AV Vmean:          95.000 cm/s PV Mean grad:  2.0 mmHg AV VTI:            0.237 m AV Peak Grad:      8.6 mmHg AV Mean Grad:      4.0 mmHg LVOT Vmax:         82.20 cm/s LVOT Vmean:        56.800 cm/s LVOT VTI:          0.147 m LVOT/AV VTI ratio: 0.62  AORTA Ao Root diam: 3.30 cm MITRAL VALVE MV Area (PHT): 3.65 cm    SHUNTS MV Area VTI:   2.72 cm    Systemic VTI:  0.15 m MV Peak grad:  2.1 mmHg    Systemic Diam: 2.20 cm MV Mean grad:  1.0 mmHg MV Vmax:       0.72 m/s MV Vmean:      46.2 cm/s MV Decel Time: 208 msec MV E velocity: 48.00 cm/s MV A velocity: 59.10 cm/s MV E/A ratio:  0.81 Serafina Royals MD Electronically signed by Serafina Royals MD Signature Date/Time: 06/01/2020/5:10:25 PM  Final       Impression / Plan:   Seth Haley is a 65 y.o. male with history of COPD, with new diagnosis of cirrhosis and ascites  Ascites s/p therapeutic paracentesis No evidence of SBP, fluid cytology is pending Total protein greater than 2.5, SAAG less than 1.1 which are not consistent with portal hypertension.  The etiology of ascites is likely secondary to cardiac in origin.  Patient does not have CKD to cause ascites.  Patient with history of COPD, ?cor pulmonale, right heart failure leading to volume overload Echocardiogram is pending Recommend cardiology consultation Low-sodium diet, diuretics as per the primary team, consider Lasix drip for aggressive diuresis as patient is quite volume overloaded  Cirrhosis of liver Recommend viral hepatitis panel Recommend ultrasound liver with Dopplers Recommend EGD for variceal screening as outpatient No evidence of liver lesions based on the CT scan Follow-up with GI for long-term management of cirrhosis  Thank you for involving me in the care of this patient.      LOS: 0 days   Sherri Sear, MD  06/01/2020, 7:48  PM   Note: This dictation was prepared with Dragon dictation along with smaller phrase technology. Any transcriptional errors that result from this process are unintentional.

## 2020-06-01 NOTE — Care Management Obs Status (Signed)
MEDICARE OBSERVATION STATUS NOTIFICATION   Patient Details  Name: Seth Haley MRN: [id number] Date of Birth: 05-09-1955   Medicare Observation Status Notification Given:  Yes    Caryn Section, RN 06/01/2020, 2:59 PM

## 2020-06-01 NOTE — Progress Notes (Signed)
PROGRESS NOTE    Seth Haley  OFB:[id number] DOB: 1955/03/22 DOA: 05/31/2020 PCP: Simonne Martinet, NP   Brief Narrative:  Seth Haley is a 65 y.o. male with medical history significant for hypertension, COPD, history of tobacco use, history of EtOH use, quit 2 years ago, obesity, presents to the emergency department for chief concerns of abdominal pain. He states that his abdomen has been bothering him for the last 2 to 3 weeks.  He endorses distention and poor appetite.  He states that he stopped taking his medications in the last week due to poor appetite and felt that it was making him retain more fluid. He endorses weight gain and bilateral lower extremity leg swelling and this has been ongoing for about 1 year. Reports that in the last year he has been eating more however states that is not necessarily more unhealthy from his baseline diet.  He states that he quit working completely and drinking alcohol completely 2 years ago when he was diagnosed with COPD. He denies fever, nausea, vomiting, chest pain, shortness of breath, dysuria, diarrhea. He states that previously, he had a bowel movement every day and in the last year, he has been having BM twice per week.  He states that it is very difficult to have a bowel movement and he has to strain constantly.  Denies blood in his stool and melena.  Hospitalist called to admit given abdominal distention pain and unclear etiology for new onset ascites.  Assessment & Plan:   Principal Problem:   Ascites Active Problems:   Essential hypertension   COPD with chronic bronchitis (HCC)   Obesity, Class III, BMI 40-49.9 (morbid obesity) (HCC)   At risk for obstructive sleep apnea   Cirrhosis of liver with ascites (HCC)   Acute intractable abdominal pain and distention in the setting of ascites Questionably newly diagnosed cirrhosis with portal venous hypertension -  Patient has prolonged history of drinking but denies any "abusive"  behavior but we discussed that even daily or frequent drinking throughout the week over years without the same effect. - Ultrasound-guided paracentesis pending -GI requested to follow, will likely need outpatient follow-up in the interim -Patient already on spironolactone at home, unclear if this is truly a new diagnosis for the patient, regardless continue spironolactone, lactulose -titrate for 2-3 bowel movements per day -Patient received 1 dose of Lasix in the ED, hold post paracentesis to follow ongoing medication changes  Acute ambulatory dysfunction in the setting of above  -Bilateral lower extremity edema, was planned for outpatient echocardiogram by cardiology -Echo currently pending to rule out heart failure as etiology of ascites and edema  Hypertension - Continue home atenolol, spironolactone  DVT prophylaxis: Early ambulation, SCDs -may be at elevated bleeding risk with presumed cirrhosis and portal hypertension Code Status: Full Family Communication: None present  Status is: Inpatient  Dispo: The patient is from: Home              Anticipated d/c is to: Home              Anticipated d/c date is: 48 to 72 hours              Patient currently not medically stable for discharge given need for further imaging, evaluation, paracentesis and medication management  Consultants:   GI  Procedures:   Paracentesis  Antimicrobials:  None indicated  Subjective: No acute issues or events overnight denies nausea vomiting diarrhea constipation headache fevers or chills.  Patient's  abdominal distention and abdominal pain continue but respiratory symptoms lower extremity edema and appear to be resolving with diuretics.  Objective: Vitals:   05/31/20 2307 06/01/20 0000 06/01/20 0100 06/01/20 0546  BP: (!) 152/94 128/86 (!) 132/116 118/75  Pulse: 99 94 76 80  Resp: 16 16 14 14   Temp:    98.2 F (36.8 C)  TempSrc:    Oral  SpO2: 97% 97% 99% 98%  Weight:      Height:         Intake/Output Summary (Last 24 hours) at 06/01/2020 0739 Last data filed at 06/01/2020 0020 Gross per 24 hour  Intake --  Output 850 ml  Net -850 ml   Filed Weights   05/31/20 1518  Weight: 121.6 kg    Examination:  General:  Pleasantly resting in bed, No acute distress. HEENT:  Normocephalic atraumatic.  Sclerae nonicteric, noninjected.  Extraocular movements intact bilaterally. Neck:  Without mass or deformity.  Trachea is midline. Lungs:  Clear to auscultate bilaterally without rhonchi, wheeze, or rales. Heart:  Regular rate and rhythm.  Without murmurs, rubs, or gallops. Abdomen:  Soft, moderately distended, nontympanic with moderate tenderness diffusely. Extremities: Without cyanosis, clubbing, 2+ pitting edema below the knees bilaterally. Vascular:  Dorsalis pedis and posterior tibial pulses palpable bilaterally. Skin:  Warm and dry, no erythema, no ulcerations.   Data Reviewed: I have personally reviewed following labs and imaging studies  CBC: Recent Labs  Lab 05/31/20 1520 06/01/20 0545  WBC 9.8 8.6  NEUTROABS  --  7.2  HGB 14.5 13.4  HCT 44.5 42.4  MCV 88.8 90.8  PLT 270 237   Basic Metabolic Panel: Recent Labs  Lab 05/31/20 1520 06/01/20 0545  NA 137 137  K 4.1 3.6  CL 90* 88*  CO2 33* 37*  GLUCOSE 104* 113*  BUN 14 12  CREATININE 0.82 0.71  CALCIUM 8.9 8.7*   GFR: Estimated Creatinine Clearance: 115 mL/min (by C-G formula based on SCr of 0.71 mg/dL). Liver Function Tests: Recent Labs  Lab 05/31/20 1520  AST 39  ALT 61*  ALKPHOS 66  BILITOT 1.5*  PROT 7.2  ALBUMIN 3.6   Recent Labs  Lab 05/31/20 1520  LIPASE 24   No results for input(s): AMMONIA in the last 168 hours. Coagulation Profile: No results for input(s): INR, PROTIME in the last 168 hours. Cardiac Enzymes: No results for input(s): CKTOTAL, CKMB, CKMBINDEX, TROPONINI in the last 168 hours. BNP (last 3 results) No results for input(s): PROBNP in the last 8760  hours. HbA1C: No results for input(s): HGBA1C in the last 72 hours. CBG: No results for input(s): GLUCAP in the last 168 hours. Lipid Profile: No results for input(s): CHOL, HDL, LDLCALC, TRIG, CHOLHDL, LDLDIRECT in the last 72 hours. Thyroid Function Tests: No results for input(s): TSH, T4TOTAL, FREET4, T3FREE, THYROIDAB in the last 72 hours. Anemia Panel: No results for input(s): VITAMINB12, FOLATE, FERRITIN, TIBC, IRON, RETICCTPCT in the last 72 hours. Sepsis Labs: No results for input(s): PROCALCITON, LATICACIDVEN in the last 168 hours.  No results found for this or any previous visit (from the past 240 hour(s)).    Radiology Studies: CT ABDOMEN PELVIS W CONTRAST  Result Date: 05/31/2020 CLINICAL DATA:  Abdominal distension for 6 weeks. EXAM: CT ABDOMEN AND PELVIS WITH CONTRAST TECHNIQUE: Multidetector CT imaging of the abdomen and pelvis was performed using the standard protocol following bolus administration of intravenous contrast. CONTRAST:  06/02/2020 OMNIPAQUE IOHEXOL 300 MG/ML  SOLN COMPARISON:  None. FINDINGS: Lower chest:  There is a small right pleural effusion with minimal overlying atelectasis. The heart is normal in size. No pericardial effusion. Hepatobiliary: Contour irregularity of the liver and prominent hepatic fissures suspicious for cirrhosis. The portal and hepatic veins are patent. The splenic vein is patent. No worrisome hepatic lesions or intrahepatic biliary dilatation. The gallbladder demonstrates scattered gallstones but no findings to suggest acute cholecystitis. Normal caliber common bile duct. Pancreas: No mass, inflammation or ductal dilatation. Spleen: Mild splenomegaly. Spleen measures 14.5 x 13.0 x 9.5 cm. No splenic lesions. Adrenals/Urinary Tract: Adrenal glands and kidneys are unremarkable. Simple right renal cyst noted. No renal calculi or bladder calculi. Stomach/Bowel: The stomach, duodenum, small bowel and colon are grossly normal. No acute inflammatory  process, mass lesions or obstructive findings. Descending colon and sigmoid colon diverticulosis without findings for acute diverticulitis. The terminal ileum is normal. The appendix is not identified for certain but no findings to suggest appendicitis. Vascular/Lymphatic: Significant age advanced atherosclerotic calcifications involving the aorta and branch vessels. There is a 5.1 x 4.7 cm infrarenal abdominal aortic aneurysm with moderate mural thrombus. This ends just above the iliac artery bifurcation. The major venous structures are patent. No mesenteric or retroperitoneal mass or adenopathy. Reproductive: The prostate gland and seminal vesicles are unremarkable. Other: Moderate to large volume abdominal/pelvic ascites. I do not see any obvious omental or peritoneal surface lesions. Small right inguinal hernia containing fat. Moderate-sized periumbilical abdominal wall hernia containing fat and a small bowel loop no findings for obstruction or incarceration. Musculoskeletal: No significant bony findings. IMPRESSION: 1. CT findings suspicious for cirrhosis with portal venous hypertension, splenomegaly and moderate to large volume abdominal/pelvic ascites. 2. No worrisome hepatic lesions. 3. Periumbilical abdominal wall hernia containing fat and a small bowel loop but no findings for incarceration or obstruction. 4. Cholelithiasis without findings to suggest acute cholecystitis. 5. 5.1 x 4.7 cm infrarenal abdominal aortic aneurysm. Recommend follow-up CT/MR every 6 months and vascular consultation. This recommendation follows ACR consensus guidelines: White Paper of the ACR Incidental Findings Committee II on Vascular Findings. J Am Coll Radiol 2013; 10:789-794. 6. Age advanced atherosclerotic calcifications involving the aorta and branch vessels. 7. Small right pleural effusion with minimal overlying atelectasis. 8. Aortic atherosclerosis. Aortic Atherosclerosis (ICD10-I70.0). Electronically Signed   By: Rudie Meyer M.D.   On: 05/31/2020 17:38   DG Chest Port 1 View  Result Date: 06/01/2020 CLINICAL DATA:  Umbilical abdominal pain for 1.5 months. History of COPD. EXAM: PORTABLE CHEST 1 VIEW COMPARISON:  CT 09/11/2015 FINDINGS: Heart size and pulmonary vascularity are normal for technique. No airspace disease or consolidation in the lungs. Small right pleural effusion with basilar atelectasis. No pneumothorax. Mediastinal contours appear intact. IMPRESSION: Small right pleural effusion with basilar atelectasis. Electronically Signed   By: Burman Nieves M.D.   On: 06/01/2020 00:24    Scheduled Meds: . aspirin EC  81 mg Oral Daily  . atenolol  25 mg Oral BID  . enoxaparin (LOVENOX) injection  60 mg Subcutaneous Q24H  . lactulose  20 g Oral BID  . sodium chloride flush  3 mL Intravenous Q12H  . spironolactone  50 mg Oral Daily  . tiotropium  1 capsule Inhalation Daily   Continuous Infusions: . sodium chloride    . albumin human       LOS: 0 days   Time spent:  Azucena Fallen, DO [facility]  If 7PM-7AM, please contact night-coverage [email or web address]  06/01/2020, 7:39 AM

## 2020-06-01 NOTE — Evaluation (Signed)
Occupational Therapy Evaluation Patient Details Name: Seth Haley MRN: [id number] DOB: 1955-06-09 Today's Date: 06/01/2020    History of Present Illness 65 y.o. male with medical history significant for hypertension, COPD, history of tobacco use, history of EtOH use, quit 2 years ago, obesity, presents to the emergency department for chief concerns of abdominal pain. He states that his abdomen has been bothering him for the last 2 to 3 weeks.  He endorses distention and poor appetite.  He states that he stopped taking his medications in the last week due to poor appetite and felt that it was making him retain more fluid. He endorses weight gain and bilateral lower extremity leg swelling and this has been ongoing for about 1 year. Reports that in the last year he has been eating more however states that is not necessarily more unhealthy from his baseline diet.  He states that he quit working completely and drinking alcohol completely 2 years ago when he was diagnosed with COPD. He denies fever, nausea, vomiting, chest pain, shortness of breath, dysuria, diarrhea. He states that previously, he had a bowel movement every day and in the last year, he has been having BM twice per week.   Clinical Impression   Upon entering the room, pt supine in bed and agreeable to OT intervention. Pt reports living alone and independently PTA. Pt is on 2 L O2 via [place] at baseline. Pt does not utilize and AD for functional mobility but does down SPC and RW. Pt has 5-6 STE with B handrails. Pt demonstrated bed mobility independently from flat bed and dons B shoes. Pt ambulating to sink for grooming tasks and demonstrates functional transfers without use of AD and without LOB. OT does discuss home set up and recommends shower chair for safety at home. OT also discussing energy conservation with self care tasks and use of oxygen. Pt returning to bed at end of session without assistance. All needs within reach. RN notified.  Pt with no acute OT needs at this time. OT to SIGN OFF. Thank you for this referral.     Follow Up Recommendations  No OT follow up    Equipment Recommendations  Tub/shower seat       Precautions / Restrictions Precautions Precautions: Fall      Mobility Bed Mobility Overal bed mobility: Independent                  Transfers Overall transfer level: Independent                    Balance Overall balance assessment: Independent                                         ADL either performed or assessed with clinical judgement   ADL Overall ADL's : Independent;At baseline                                             Vision Patient Visual Report: No change from baseline              Pertinent Vitals/Pain Pain Assessment: No/denies pain     Hand Dominance Right   Extremity/Trunk Assessment Upper Extremity Assessment Upper Extremity Assessment: Overall WFL for tasks assessed   Lower Extremity Assessment  Lower Extremity Assessment: Overall WFL for tasks assessed       Communication Communication Communication: No difficulties   Cognition Arousal/Alertness: Awake/alert Behavior During Therapy: WFL for tasks assessed/performed Overall Cognitive Status: Within Functional Limits for tasks assessed                                 General Comments: Pt A & O x 4 and very pleasant throughout              Home Living Family/patient expects to be discharged to:: Private residence Living Arrangements: Alone   Type of Home: House Home Access: Stairs to enter [employer] of Steps: 5-6 STE Entrance Stairs-Rails: Right;Left Home Layout: One level     Bathroom Shower/Tub: Chief Strategy Officer: Standard     Home Equipment: Environmental consultant - 2 wheels;Cane - single point   Additional Comments: Pt lives in a townhouse. All areas on 1 floor except and additional bedroom and treadmill  up another 6 steps per pt report      Prior Functioning/Environment Level of Independence: Independent        Comments: use of O2 concentrator - 2 L at baseline                 OT Goals(Current goals can be found in the care plan section) Acute Rehab OT Goals Patient Stated Goal: to go home  OT Frequency:     Barriers to D/C:    none known at this time          AM-PAC OT "6 Clicks" Daily Activity     Outcome Measure Help from another person eating meals?: None Help from another person taking care of personal grooming?: None Help from another person toileting, which includes using toliet, bedpan, or urinal?: None Help from another person bathing (including washing, rinsing, drying)?: None Help from another person to put on and taking off regular upper body clothing?: None Help from another person to put on and taking off regular lower body clothing?: None 6 Click Score: 24   End of Session Nurse Communication: Mobility status  Activity Tolerance: Patient tolerated treatment well Patient left: in bed;with call bell/phone within reach;with bed alarm set                   Time: 1610-9604 OT Time Calculation (min): 26 min Charges:  OT General Charges $OT Visit: 1 Visit OT Evaluation $OT Eval Low Complexity: 1 Low OT Treatments $Self Care/Home Management : 8-22 mins   Jackquline Denmark, MS, OTR/L , CBIS ascom [phone]  06/01/20, 1:37 PM  06/01/2020, 1:37 PM

## 2020-06-01 NOTE — ED Notes (Signed)
Assisted pt to bathroom, steady gait, 'was just gas'

## 2020-06-01 NOTE — Evaluation (Signed)
Physical Therapy Evaluation Patient Details Name: Seth Haley MRN: [id number] DOB: [birth year] Today's Date: 06/01/2020   History of Present Illness  Pt is a 65 y.o. male with medical history significant for hypertension, COPD, history of tobacco use, history of EtOH use, quit 2 years ago, obesity, presents to the emergency department for chief concerns of abdominal pain. MD assessment includes: abdominal pain -suspect secondary to abdominal distention in setting of new diagnosis of liver cirrhosis with ascites with pt now s/p paracentesis with 5 L of fluid removed, portal venous HTN, and BLE swelling.    Clinical Impression  Pt was pleasant and motivated to participate during the session.  Pt was independent with functional tasks and was able to ambulate with good stability without an AD.  Pt reported being unable to ambulate down to his mailbox at home secondary to poor activity tolerance and SOB with activity.  Pt will benefit from pulmonary rehab upon discharge from acute care for decreased risk of rapid functional decline.       Follow Up Recommendations Other (comment) (Pulmonary Rehab)    Equipment Recommendations  None recommended by PT    Recommendations for Other Services       Precautions / Restrictions Precautions Precautions: Fall Restrictions Weight Bearing Restrictions: No      Mobility  Bed Mobility Overal bed mobility: Independent                  Transfers Overall transfer level: Independent                  Ambulation/Gait Ambulation/Gait assistance: Independent Gait Distance (Feet): 40 Feet Assistive device: None Gait Pattern/deviations: WFL(Within Functional Limits) Gait velocity: WFL   General Gait Details: Pt steady with amb without an AD including with sharp turns and start/stops; SpO2 on 2LO2/min 92% after amb compared to 95% at rest, HR WNL throughout  Stairs            Wheelchair Mobility    Modified Rankin  (Stroke Patients Only)       Balance Overall balance assessment: No apparent balance deficits (not formally assessed)                                           Pertinent Vitals/Pain Pain Assessment: No/denies pain    Home Living Family/patient expects to be discharged to:: Private residence Living Arrangements: Alone   Type of Home: House Home Access: Stairs to enter Entrance Stairs-Rails: Right;Left;Can reach both Entrance Stairs-Number of Steps: 5-6 STE Home Layout: One level Home Equipment: Walker - 2 wheels;Cane - single point Additional Comments: Pt lives in a townhouse. All areas on 1 floor except and additional bedroom and treadmill up another 6 steps per pt report    Prior Function Level of Independence: Independent         Comments: Ind amb without AD household and very limited community distances, Ind with ADLs, no fall history, uses 2LO2 at baseline     Hand Dominance   Dominant Hand: Right    Extremity/Trunk Assessment   Upper Extremity Assessment Upper Extremity Assessment: Overall WFL for tasks assessed    Lower Extremity Assessment Lower Extremity Assessment: Overall WFL for tasks assessed       Communication   Communication: No difficulties  Cognition Arousal/Alertness: Awake/alert Behavior During Therapy: WFL for tasks assessed/performed Overall Cognitive Status: Within Functional Limits for tasks  assessed                                 General Comments: Pt A & O x 4 and very pleasant throughout      General Comments      Exercises Other Exercises Other Exercises: Pt education provided on dyspnea spiral and physiological benefits of activity along with recommendations for participation with pulmonary rehab   Assessment/Plan    PT Assessment Patient needs continued PT services  PT Problem List Decreased activity tolerance;Cardiopulmonary status limiting activity       PT Treatment Interventions  Gait training;Stair training;Therapeutic activities;Therapeutic exercise;Patient/family education;Balance training    PT Goals (Current goals can be found in the Care Plan section)  Acute Rehab PT Goals Patient Stated Goal: Improved endurance PT Goal Formulation: With patient Time For Goal Achievement: 06/14/20 Potential to Achieve Goals: Fair    Frequency Min 2X/week   Barriers to discharge        Co-evaluation               AM-PAC PT "6 Clicks" Mobility  Outcome Measure Help needed turning from your back to your side while in a flat bed without using bedrails?: None Help needed moving from lying on your back to sitting on the side of a flat bed without using bedrails?: None Help needed moving to and from a bed to a chair (including a wheelchair)?: None Help needed standing up from a chair using your arms (e.g., wheelchair or bedside chair)?: None Help needed to walk in hospital room?: None Help needed climbing 3-5 steps with a railing? : None 6 Click Score: 24    End of Session Equipment Utilized During Treatment: Oxygen Activity Tolerance: Patient tolerated treatment well Patient left: in chair;with call bell/phone within reach;with family/visitor present Nurse Communication: Mobility status PT Visit Diagnosis: Difficulty in walking, not elsewhere classified (R26.2)    Time: 1501-1530 PT Time Calculation (min) (ACUTE ONLY): 29 min   Charges:   PT Evaluation $PT Eval Low Complexity: 1 Low          D. Elly Modena PT, DPT 06/01/20, 3:54 PM

## 2020-06-01 NOTE — ED Notes (Signed)
Assisted pt with bringing belongings closer and provided with sheet, updated on poc, no other needs at this time

## 2020-06-01 NOTE — Procedures (Signed)
Ultrasound-guided diagnostic and therapeutic paracentesis performed yielding 5 liters of star colored fluid.  Fluid was sent to lab for analysis. No immediate complications. EBL is none.

## 2020-06-01 NOTE — TOC Transition Note (Signed)
Transition of Care [facility]) - CM/SW Discharge Note   Patient Details  Name: Aman Bonet MRN: [id number] Date of Birth: 18-Jan-1956  [facility] CM/SW Contact:  Caryn Section, RN Phone Number: 06/01/2020, 2:49 PM   Clinical Narrative:   TOC at bedside, patient awake and alert.  States he lives at home by himself, sister lives about 10 miles away.  He is able to get to appointments and get medications with the help of his sister when needed.    The only issue regarding appointments that patient has is that if the appointment is over 1 hour, his portable concentrator needs to be charged.  TOC looking into this and will assist.  Patient feels safe returning home on his own and is open to home care if required.  Oxygen was in place at patient's home prior to admission and patient cannot recall the service provider.  States he has no complaints about current service.  No further questions or concerns from patient at this time, TOC contact information given, and TOC will follow through discharge.    Final next level of care:  (TBD) Barriers to Discharge: Continued Medical Work up   Patient Goals and CMS Choice     Choice offered to / list presented to : NA  Discharge Placement                       Discharge Plan and Services   Discharge Planning Services: CM Consult                                 Social Determinants of Health (SDOH) Interventions     Readmission Risk Interventions No flowsheet data found.

## 2020-06-01 NOTE — Care Management Obs Status (Signed)
MEDICARE OBSERVATION STATUS NOTIFICATION   Patient Details  Name: Seth Haley MRN: [id number] Date of Birth: 05/24/1955   Medicare Observation Status Notification Given:  Yes    Caryn Section, RN 06/01/2020, 2:58 PM

## 2020-06-02 ENCOUNTER — Inpatient Hospital Stay: Payer: Medicare Other

## 2020-06-02 LAB — BASIC METABOLIC PANEL
Anion gap: 10 (ref 5–15)
BUN: 9 mg/dL (ref 8–23)
CO2: 41 mmol/L — ABNORMAL HIGH (ref 22–32)
Calcium: 9.2 mg/dL (ref 8.9–10.3)
Chloride: 89 mmol/L — ABNORMAL LOW (ref 98–111)
Creatinine, Ser: 0.83 mg/dL (ref 0.61–1.24)
GFR, Estimated: >60 mL/min (ref >60)
Glucose, Bld: 147 mg/dL — ABNORMAL HIGH (ref 70–99)
Potassium: 4.5 mmol/L (ref 3.5–5.1)
Sodium: 140 mmol/L (ref 135–145)

## 2020-06-02 LAB — BRAIN NATRIURETIC PEPTIDE: B Natriuretic Peptide: 48.7 pg/mL (ref 0.0–100.0)

## 2020-06-02 LAB — CYTOLOGY - NON PAP

## 2020-06-02 MED ORDER — ENOXAPARIN SODIUM 60 MG/0.6ML ~~LOC~~ SOLN
0.5000 mg/kg | SUBCUTANEOUS | Status: DC
  Administered 2020-06-02 – 2020-06-04 (×3): 57.5 mg via SUBCUTANEOUS
  Filled 2020-06-02 (×3): qty 0.6

## 2020-06-02 MED ORDER — FUROSEMIDE 10 MG/ML IJ SOLN
40.0000 mg | Freq: Two times a day (BID) | INTRAMUSCULAR | Status: DC
  Administered 2020-06-02 – 2020-06-03 (×4): 40 mg via INTRAVENOUS
  Filled 2020-06-02 (×3): qty 4

## 2020-06-02 NOTE — Plan of Care (Signed)

## 2020-06-02 NOTE — Progress Notes (Signed)
PROGRESS NOTE    Seth Haley  NWG:[id number] DOB: 03-13-1955 DOA: 05/31/2020 PCP: Simonne Martinet, NP   Brief Narrative:  Seth Haley is a 65 y.o. male with medical history significant for hypertension, COPD, history of tobacco use, history of EtOH use, quit 2 years ago, obesity, presents to the emergency department for chief concerns of abdominal pain. He states that his abdomen has been bothering him for the last 2 to 3 weeks.  He endorses distention and poor appetite.  He states that he stopped taking his medications in the last week due to poor appetite and felt that it was making him retain more fluid. He endorses weight gain and bilateral lower extremity leg swelling and this has been ongoing for about 1 year. Reports that in the last year he has been eating more however states that is not necessarily more unhealthy from his baseline diet.  He states that he quit working completely and drinking alcohol completely 2 years ago when he was diagnosed with COPD. He denies fever, nausea, vomiting, chest pain, shortness of breath, dysuria, diarrhea. He states that previously, he had a bowel movement every day and in the last year, he has been having BM twice per week.  He states that it is very difficult to have a bowel movement and he has to strain constantly.  Denies blood in his stool and melena.  Hospitalist called to admit given abdominal distention pain and unclear etiology for new onset ascites.  Assessment & Plan:   Principal Problem:   Ascites Active Problems:   Essential hypertension   COPD with chronic bronchitis (HCC)   Obesity, Class III, BMI 40-49.9 (morbid obesity) (HCC)   At risk for obstructive sleep apnea   Cirrhosis of liver with ascites (HCC)   Abdominal pain   Acute intractable abdominal pain and distention in the setting of ascites Questionably newly diagnosed cirrhosis with portal venous hypertension vs heart failure exacerbation -  Patient has prolonged  history of drinking but denies any "abusive" behavior but we discussed that even daily or frequent drinking throughout the week over years without the same effect. - Hepatitis panel nonreactive, serum albumin gradient not consistent with portal hypertension - Tolerated paracentesis quite well -Ultrasound liver pending today for further evaluation given unclear etiology and negative work-up thus far, appreciate GIs assistance and recommendations - Echocardiogram appears to be within normal limits as well - Patient previously on spironolactone, unclear if this is truly a new diagnosis for the patient(moderately poor historian), regardless continue spironolactone, lactulose -titrate for 2-3 bowel movements per day (still no bowel movement in 3 to 5 days) -Restart furosemide 40 IV twice daily given ongoing volume issues without clear etiology. **Of note patient reports an atypical allergic reaction to a "water pill" in the past with previous physician but is not listed on his current allergies.  He states it was a combo water pill which is concerning for either Entresto or lisinopril/HCTZ but again unclear if this is an ARB/ACE angioedema picture or something more insidious given his report that he would have transient swelling of distal body parts not just of the face and neck.  Acute ambulatory dysfunction in the setting of above  -Bilateral lower extremity edema, was planned for outpatient echocardiogram by cardiology -Echo currently pending to rule out heart failure as etiology of ascites and edema  Hypertension - Continue home atenolol, spironolactone  DVT prophylaxis: Early ambulation, SCDs -may be at elevated bleeding risk with presumed cirrhosis and portal  hypertension Code Status: Full Family Communication: None present  Status is: Inpatient  Dispo: The patient is from: Home              Anticipated d/c is to: Home              Anticipated d/c date is: 48 to 72 hours               Patient currently not medically stable for discharge given need for further imaging, evaluation, paracentesis and medication management  Consultants:   GI  Procedures:   Paracentesis  Antimicrobials:  None indicated  Subjective: Patient's only complaint today is ongoing constipation denies nausea vomiting diarrhea headache fevers chills chest pain or shortness of breath.  He also continues to complain of generalized edema and feeling "full and bloated"  Objective: Vitals:   06/01/20 2056 06/01/20 2332 06/02/20 0456 06/02/20 0500  BP: 124/76 126/80 126/88   Pulse: 81 79 79   Resp: 20 20 (!) 22   Temp: 98.1 F (36.7 C) 97.6 F (36.4 C) 97.7 F (36.5 C)   TempSrc: Oral  Oral   SpO2: 98% 98% 97%   Weight:    114.5 kg  Height:        Intake/Output Summary (Last 24 hours) at 06/02/2020 0708 Last data filed at 06/02/2020 0330 Gross per 24 hour  Intake 600 ml  Output 150 ml  Net 450 ml   Filed Weights   05/31/20 1518 06/02/20 0500  Weight: 121.6 kg 114.5 kg    Examination:  General:  Pleasantly resting in bed, No acute distress. HEENT:  Normocephalic atraumatic.  Sclerae nonicteric, noninjected.  Extraocular movements intact bilaterally. Neck:  Without mass or deformity.  Trachea is midline. Lungs:  Clear to auscultate bilaterally without rhonchi, wheeze, or rales. Heart:  Regular rate and rhythm.  Without murmurs, rubs, or gallops. Abdomen:  Soft, moderately distended, nontympanic with moderate tenderness diffusely. Extremities: Without cyanosis, clubbing, 2+ pitting edema below the knees bilaterally. Vascular:  Dorsalis pedis and posterior tibial pulses palpable bilaterally. Skin:  Warm and dry, no erythema, no ulcerations.   Data Reviewed: I have personally reviewed following labs and imaging studies  CBC: Recent Labs  Lab 05/31/20 1520 06/01/20 0545  WBC 9.8 8.6  NEUTROABS  --  7.2  HGB 14.5 13.4  HCT 44.5 42.4  MCV 88.8 90.8  PLT 270 237   Basic  Metabolic Panel: Recent Labs  Lab 05/31/20 1520 06/01/20 0545 06/02/20 0331  NA 137 137 140  K 4.1 3.6 4.5  CL 90* 88* 89*  CO2 33* 37* 41*  GLUCOSE 104* 113* 147*  BUN CREATININE 0.82 0.71 0.83  CALCIUM 8.9 8.7* 9.2   GFR: Estimated Creatinine Clearance: 107.3 mL/min (by C-G formula based on SCr of 0.83 mg/dL). Liver Function Tests: Recent Labs  Lab 05/31/20 1520  AST 39  ALT 61*  ALKPHOS 66  BILITOT 1.5*  PROT 7.2  ALBUMIN 3.6   Recent Labs  Lab 05/31/20 1520  LIPASE 24   No results for input(s): AMMONIA in the last 168 hours. Coagulation Profile: No results for input(s): INR, PROTIME in the last 168 hours. Cardiac Enzymes: No results for input(s): CKTOTAL, CKMB, CKMBINDEX, TROPONINI in the last 168 hours. BNP (last 3 results) No results for input(s): PROBNP in the last 8760 hours. HbA1C: No results for input(s): HGBA1C in the last 72 hours. CBG: No results for input(s): GLUCAP in the last 168 hours. Lipid Profile: No  results for input(s): CHOL, HDL, LDLCALC, TRIG, CHOLHDL, LDLDIRECT in the last 72 hours. Thyroid Function Tests: No results for input(s): TSH, T4TOTAL, FREET4, T3FREE, THYROIDAB in the last 72 hours. Anemia Panel: No results for input(s): VITAMINB12, FOLATE, FERRITIN, TIBC, IRON, RETICCTPCT in the last 72 hours. Sepsis Labs: No results for input(s): PROCALCITON, LATICACIDVEN in the last 168 hours.  Recent Results (from the past 240 hour(s))  SARS CORONAVIRUS 2 (TAT 6-24 HRS) Nasopharyngeal Nasopharyngeal Swab     Status: None   Collection Time: 05/31/20  6:21 PM   Specimen: Nasopharyngeal Swab  Result Value Ref Range Status   SARS Coronavirus 2 NEGATIVE NEGATIVE Final    Comment: (NOTE) SARS-CoV-2 target nucleic acids are NOT DETECTED.  The SARS-CoV-2 RNA is generally detectable in upper and lower respiratory specimens during the acute phase of infection. Negative results do not preclude SARS-CoV-2 infection, do not rule  out co-infections with other pathogens, and should not be used as the sole basis for treatment or other patient management decisions. Negative results must be combined with clinical observations, patient history, and epidemiological information. The expected result is Negative.  Fact Sheet for Patients: HairSlick.no  Fact Sheet for Healthcare Providers: quierodirigir.com  This test is not yet approved or cleared by the [place] FDA and  has been authorized for detection and/or diagnosis of SARS-CoV-2 by FDA under an Emergency Use Authorization (EUA). This EUA will remain  in effect (meaning this test can be used) for the duration of the COVID-19 declaration under Se ction 564(b)(1) of the Act, 21 U.S.C. section 360bbb-3(b)(1), unless the authorization is terminated or revoked sooner.  Performed at [facility], [address] [place], [place] [address]   Body fluid culture w Gram Stain     Status: None (Preliminary result)   Collection Time: 06/01/20 10:21 AM   Specimen: PATH Cytology Peritoneal fluid  Result Value Ref Range Status   Specimen Description   Final    PERITONEAL Performed at [facility], [address] [place], [place] [address]    Special Requests   Final    PERITONEAL Performed at [facility], [address] [place], [place] [address]    Gram Stain   Final    RARE WBC PRESENT, PREDOMINANTLY MONONUCLEAR NO ORGANISMS SEEN Performed at [facility], [address], [place], [place] [address]    Culture PENDING  Incomplete   Report Status PENDING  Incomplete      Radiology Studies: CT ABDOMEN PELVIS W CONTRAST  Result Date: 05/31/2020 CLINICAL DATA:  Abdominal distension for 6 weeks. EXAM: CT ABDOMEN AND PELVIS WITH CONTRAST TECHNIQUE: Multidetector CT imaging of the abdomen and pelvis was performed using the standard protocol following bolus administration of  intravenous contrast. CONTRAST:  OMNIPAQUE IOHEXOL 300 MG/ML  SOLN COMPARISON:  None. FINDINGS: Lower chest: There is a small right pleural effusion with minimal overlying atelectasis. The heart is normal in size. No pericardial effusion. Hepatobiliary: Contour irregularity of the liver and prominent hepatic fissures suspicious for cirrhosis. The portal and hepatic veins are patent. The splenic vein is patent. No worrisome hepatic lesions or intrahepatic biliary dilatation. The gallbladder demonstrates scattered gallstones but no findings to suggest acute cholecystitis. Normal caliber common bile duct. Pancreas: No mass, inflammation or ductal dilatation. Spleen: Mild splenomegaly. Spleen measures 14.5 x 13.0 x 9.5 cm. No splenic lesions. Adrenals/Urinary Tract: Adrenal glands and kidneys are unremarkable. Simple right renal cyst noted. No renal calculi or bladder calculi. Stomach/Bowel:  The stomach, duodenum, small bowel and colon are grossly normal. No acute inflammatory process, mass lesions or obstructive findings. Descending colon and sigmoid colon diverticulosis without findings for acute diverticulitis. The terminal ileum is normal. The appendix is not identified for certain but no findings to suggest appendicitis. Vascular/Lymphatic: Significant age advanced atherosclerotic calcifications involving the aorta and branch vessels. There is a 5.1 x 4.7 cm infrarenal abdominal aortic aneurysm with moderate mural thrombus. This ends just above the iliac artery bifurcation. The major venous structures are patent. No mesenteric or retroperitoneal mass or adenopathy. Reproductive: The prostate gland and seminal vesicles are unremarkable. Other: Moderate to large volume abdominal/pelvic ascites. I do not see any obvious omental or peritoneal surface lesions. Small right inguinal hernia containing fat. Moderate-sized periumbilical abdominal wall hernia containing fat and a small bowel loop no findings for  obstruction or incarceration. Musculoskeletal: No significant bony findings. IMPRESSION: 1. CT findings suspicious for cirrhosis with portal venous hypertension, splenomegaly and moderate to large volume abdominal/pelvic ascites. 2. No worrisome hepatic lesions. 3. Periumbilical abdominal wall hernia containing fat and a small bowel loop but no findings for incarceration or obstruction. 4. Cholelithiasis without findings to suggest acute cholecystitis. 5. 5.1 x 4.7 cm infrarenal abdominal aortic aneurysm. Recommend follow-up CT/MR every 6 months and vascular consultation. This recommendation follows ACR consensus guidelines: White Paper of the ACR Incidental Findings Committee II on Vascular Findings. J Am Coll Radiol 2013; 10:789-794. 6. Age advanced atherosclerotic calcifications involving the aorta and branch vessels. 7. Small right pleural effusion with minimal overlying atelectasis. 8. Aortic atherosclerosis. Aortic Atherosclerosis (ICD10-I70.0). Electronically Signed   By: Rudie Meyer M.D.   On: 05/31/2020 17:38   US Paracentesis  Result Date: 06/01/2020 INDICATION: Patient with history of hypertension and former alcohol use. Presented to the emergency department with abdominal pain and distension. Request is for therapeutic and diagnostic paracentesis. Maximum of 5 L. EXAM: ULTRASOUND GUIDED THERAPEUTIC AND DIAGNOSTIC PARACENTESIS MEDICATIONS: Lidocaine 1% 10 mL COMPLICATIONS: None immediate. PROCEDURE: Informed written consent was obtained from the patient after a discussion of the risks, benefits and alternatives to treatment. A timeout was performed prior to the initiation of the procedure. Initial ultrasound scanning demonstrates a large amount of ascites within the right lower abdominal quadrant. The right lower abdomen was prepped and draped in the usual sterile fashion. 1% lidocaine was used for local anesthesia. Following this, a 19 gauge, 7-cm, Yueh catheter was introduced. An ultrasound image  was saved for documentation purposes. The paracentesis was performed. The catheter was removed and a dressing was applied. The patient tolerated the procedure well without immediate post procedural complication. FINDINGS: A total of approximately 5 L of straw-colored fluid was removed. Samples were sent to the laboratory as requested by the clinical team. IMPRESSION: Successful ultrasound-guided therapeutic and diagnostic paracentesis yielding 5 L liters of peritoneal fluid. Read by: Anders Grant, NP Electronically Signed   By: Simonne Come M.D.   On: 06/01/2020 10:39   DG Chest Port 1 View  Result Date: 06/01/2020 CLINICAL DATA:  Umbilical abdominal pain for 1.5 months. History of COPD. EXAM: PORTABLE CHEST 1 VIEW COMPARISON:  CT 09/11/2015 FINDINGS: Heart size and pulmonary vascularity are normal for technique. No airspace disease or consolidation in the lungs. Small right pleural effusion with basilar atelectasis. No pneumothorax. Mediastinal contours appear intact. IMPRESSION: Small right pleural effusion with basilar atelectasis. Electronically Signed   By: Burman Nieves M.D.   On: 06/01/2020 00:24   ECHOCARDIOGRAM COMPLETE  Result Date: 06/01/2020  ECHOCARDIOGRAM REPORT   Patient Name:   Seth Haley Date of Exam: 06/01/2020 Medical Rec #:  [id number]          Height:       67.0 in Accession #:    [id number]         Weight:       268.0 lb Date of Birth:  08-Jun-1955         BSA:          2.290 m Patient Age:    64 years           BP:           146/83 mmHg Patient Gender: M                  HR:           68 bpm. Exam Location:  [facility] Procedure: Color Doppler, Cardiac Doppler and Intracardiac Opacification Agent Indications:     R06.00 Dyspnea  History:         Patient has no prior history of Echocardiogram examinations.                  COPD; Risk Factors:Hypertension.  Sonographer:     Humphrey RollsJoan Heiss RDCS (AE) Referring Phys:  [id number] AMY N COX Diagnosing Phys: Arnoldo HookerBruce Kowalski MD  Sonographer  Comments: Technically difficult study due to poor echo windows. Image acquisition challenging due to patient body habitus and Image acquisition challenging due to COPD. IMPRESSIONS  1. Left ventricular ejection fraction, by estimation, is 65 to 70%. The left ventricle has normal function. The left ventricle has no regional wall motion abnormalities. Left ventricular diastolic parameters were normal.  2. Right ventricular systolic function is normal. The right ventricular size is normal.  3. The mitral valve is normal in structure. Trivial mitral valve regurgitation.  4. The aortic valve is normal in structure. Aortic valve regurgitation is not visualized. FINDINGS  Left Ventricle: Left ventricular ejection fraction, by estimation, is 65 to 70%. The left ventricle has normal function. The left ventricle has no regional wall motion abnormalities. Definity contrast agent was given IV to delineate the left ventricular  endocardial borders. The left ventricular internal cavity size was small. There is no left ventricular hypertrophy. Left ventricular diastolic parameters were normal. Right Ventricle: The right ventricular size is normal. No increase in right ventricular wall thickness. Right ventricular systolic function is normal. Left Atrium: Left atrial size was normal in size. Right Atrium: Right atrial size was normal in size. Pericardium: There is no evidence of pericardial effusion. Mitral Valve: The mitral valve is normal in structure. Trivial mitral valve regurgitation. MV peak gradient, 2.1 mmHg. The mean mitral valve gradient is 1.0 mmHg. Tricuspid Valve: The tricuspid valve is normal in structure. Tricuspid valve regurgitation is trivial. Aortic Valve: The aortic valve is normal in structure. Aortic valve regurgitation is not visualized. Aortic valve mean gradient measures 4.0 mmHg. Aortic valve peak gradient measures 8.6 mmHg. Aortic valve area, by VTI measures 2.36 cm. Pulmonic Valve: The pulmonic valve  was normal in structure. Pulmonic valve regurgitation is not visualized. Aorta: The aortic root and ascending aorta are structurally normal, with no evidence of dilitation. IAS/Shunts: No atrial level shunt detected by color flow Doppler.  LEFT VENTRICLE PLAX 2D LVIDd:         4.80 cm  Diastology LVIDs:         3.50 cm  LV e' medial:    7.62 cm/s LV PW:  1.10 cm  LV E/e' medial:  6.3 LV IVS:        1.00 cm  LV e' lateral:   7.83 cm/s LVOT diam:     2.20 cm  LV E/e' lateral: 6.1 LV SV:         56 LV SV Index:   24 LVOT Area:     3.80 cm  LEFT ATRIUM           Index LA diam:      2.60 cm 1.14 cm/m LA Vol (A4C): 31.8 ml 13.89 ml/m  AORTIC VALVE                   PULMONIC VALVE AV Area (Vmax):    2.13 cm    PV Vmax:       1.03 m/s AV Area (Vmean):   2.27 cm    PV Vmean:      67.000 cm/s AV Area (VTI):     2.36 cm    PV VTI:        0.177 m AV Vmax:           147.00 cm/s PV Peak grad:  4.2 mmHg AV Vmean:          95.000 cm/s PV Mean grad:  2.0 mmHg AV VTI:            0.237 m AV Peak Grad:      8.6 mmHg AV Mean Grad:      4.0 mmHg LVOT Vmax:         82.20 cm/s LVOT Vmean:        56.800 cm/s LVOT VTI:          0.147 m LVOT/AV VTI ratio: 0.62  AORTA Ao Root diam: 3.30 cm MITRAL VALVE MV Area (PHT): 3.65 cm    SHUNTS MV Area VTI:   2.72 cm    Systemic VTI:  0.15 m MV Peak grad:  2.1 mmHg    Systemic Diam: 2.20 cm MV Mean grad:  1.0 mmHg MV Vmax:       0.72 m/s MV Vmean:      46.2 cm/s MV Decel Time: 208 msec MV E velocity: 48.00 cm/s MV A velocity: 59.10 cm/s MV E/A ratio:  0.81 Arnoldo Hooker MD Electronically signed by Arnoldo Hooker MD Signature Date/Time: 06/01/2020/5:10:25 PM    Final     Scheduled Meds: . aspirin EC  81 mg Oral Daily  . atenolol  25 mg Oral BID  . enoxaparin (LOVENOX) injection  60 mg Subcutaneous Q24H  . lactulose  20 g Oral BID  . sodium chloride flush  3 mL Intravenous Q12H  . spironolactone  100 mg Oral Daily  . tiotropium  1 capsule Inhalation Daily   Continuous  Infusions: . sodium chloride    . albumin human       LOS: 1 day   Time spent:  Azucena Fallen, DO [facility]  If 7PM-7AM, please contact night-coverage [email or web address]  06/02/2020, 7:08 AM

## 2020-06-03 DIAGNOSIS — R1084 Generalized abdominal pain: Secondary | ICD-10-CM

## 2020-06-03 LAB — CBC
HCT: 45.6 % (ref 39.0–52.0)
Hemoglobin: 14.2 g/dL (ref 13.0–17.0)
MCH: 28.5 pg (ref 26.0–34.0)
MCHC: 31.1 g/dL (ref 30.0–36.0)
MCV: 91.6 fL (ref 80.0–100.0)
Platelets: 235 10*3/uL (ref 150–400)
RBC: 4.98 MIL/uL (ref 4.22–5.81)
RDW: 13.5 % (ref 11.5–15.5)
WBC: 9.9 10*3/uL (ref 4.0–10.5)
nRBC: 0 % (ref 0.0–0.2)

## 2020-06-03 LAB — COMPREHENSIVE METABOLIC PANEL
ALT: 52 U/L — ABNORMAL HIGH (ref 0–44)
AST: 30 U/L (ref 15–41)
Albumin: 2.9 g/dL — ABNORMAL LOW (ref 3.5–5.0)
Alkaline Phosphatase: 58 U/L (ref 38–126)
Anion gap: 12 (ref 5–15)
BUN: 9 mg/dL (ref 8–23)
CO2: 38 mmol/L — ABNORMAL HIGH (ref 22–32)
Calcium: 8.4 mg/dL — ABNORMAL LOW (ref 8.9–10.3)
Chloride: 86 mmol/L — ABNORMAL LOW (ref 98–111)
Creatinine, Ser: 0.8 mg/dL (ref 0.61–1.24)
GFR, Estimated: >60 mL/min (ref >60)
Glucose, Bld: 119 mg/dL — ABNORMAL HIGH (ref 70–99)
Potassium: 3.4 mmol/L — ABNORMAL LOW (ref 3.5–5.1)
Sodium: 136 mmol/L (ref 135–145)
Total Bilirubin: 0.7 mg/dL (ref 0.3–1.2)
Total Protein: 6 g/dL — ABNORMAL LOW (ref 6.5–8.1)

## 2020-06-03 LAB — BRAIN NATRIURETIC PEPTIDE: B Natriuretic Peptide: 50.4 pg/mL (ref 0.0–100.0)

## 2020-06-03 MED ORDER — POTASSIUM CHLORIDE CRYS ER 20 MEQ PO TBCR
20.0000 meq | EXTENDED_RELEASE_TABLET | Freq: Once | ORAL | Status: AC
  Administered 2020-06-03: 08:00:00 20 meq via ORAL
  Filled 2020-06-03: qty 1

## 2020-06-03 NOTE — TOC Progression Note (Signed)
Transition of Care [facility]) - Progression Note    Patient Details  Name: Seth Haley MRN: [id number] Date of Birth: 06/15/1955  [facility] CM/SW Contact  Caryn Section, RN Phone Number: 06/03/2020, 3:10 PM  Clinical Narrative:   TOC in to see patient, discussed oxygen need at appointments.  [employer] attempted to call several times and patient did not respond.  States he thinks he can manage oxygen situation, but will call [employer] to see if there are other options to get him through appointments > 1 hour.  OT recommends shower chair, patient accepted DME and asked that I come back another time because he was tired.  TOC contact information given to patient, TOC will follow through discharge.    Expected Discharge Plan:  (Home TBD if home health but patient is independent) Barriers to Discharge: Continued Medical Work up  Expected Discharge Plan and Services Expected Discharge Plan:  (Home TBD if home health but patient is independent)   Discharge Planning Services: CM Consult   Living arrangements for the past 2 months: Single Family Home                                       Social Determinants of Health (SDOH) Interventions    Readmission Risk Interventions No flowsheet data found.

## 2020-06-03 NOTE — Progress Notes (Signed)
Arlyss Repress, MD [address]  [address]  [place], [place] [address]  Main: [phone]  Fax: [phone] Pager: [phone]   Subjective: Patient is sitting up in bed, comfortable, he has less respiratory distress compared to admission   Objective: Vital signs in last 24 hours: Vitals:   06/03/20 0500 06/03/20 0804 06/03/20 1136 06/03/20 1624  BP:  112/68 113/76 101/65  Pulse:  67 67 65  Resp:  18 16   Temp:  98.4 F (36.9 C) 97.8 F (36.6 C) 98.4 F (36.9 C)  TempSrc:  Oral Oral Oral  SpO2:  98% 95% 96%  Weight: 114.1 kg     Height:       Weight change: -0.4 kg  Intake/Output Summary (Last 24 hours) at 06/03/2020 1640 Last data filed at 06/03/2020 1356 Gross per 24 hour  Intake 840 ml  Output 800 ml  Net 40 ml     Exam: Heart:: Regular rate and rhythm, S1S2 present or without murmur or extra heart sounds Lungs: normal and clear to auscultation Abdomen: Soft, nontender, obese, diffusely distended   Lab Results: CBC Latest Ref Rng & Units 06/03/2020 06/01/2020 05/31/2020  WBC 4.0 - 10.5 K/uL 9.9 8.6 9.8  Hemoglobin 13.0 - 17.0 g/dL 93.9 03.0 09.2  Hematocrit 39.0 - 52.0 % 45.6 42.4 44.5  Platelets 150 - 400 K/uL 235 237 270   CMP Latest Ref Rng & Units 06/03/2020 06/02/2020 06/01/2020  Glucose 70 - 99 mg/dL 330(Q) 762(U) 633(H)  BUN 8 - 23 mg/dL 9 9 12   Creatinine 0.61 - 1.24 mg/dL 5.45 6.25  Sodium 135 - 145 mmol/L 136 140 137  Potassium 3.5 - 5.1 mmol/L 3.4(L) 4.5 3.6  Chloride 98 - 111 mmol/L 86(L) 89(L) 88(L)  CO2 22 - 32 mmol/L 38(H) 41(H) 37(H)  Calcium 8.9 - 10.3 mg/dL 6.38) 9.2 9.3(T)  Total Protein 6.5 - 8.1 g/dL 6.0(L) - -  Total Bilirubin 0.3 - 1.2 mg/dL 0.7 - -  Alkaline Phos 38 - 126 U/L 58 - -  AST 15 - 41 U/L 30 - -  ALT 0 - 44 U/L 52(H) - -    Micro Results: Recent Results (from the past 240 hour(s))  SARS CORONAVIRUS 2 (TAT 6-24 HRS) Nasopharyngeal Nasopharyngeal Swab     Status: None   Collection Time: 05/31/20   6:21 PM   Specimen: Nasopharyngeal Swab  Result Value Ref Range Status   SARS Coronavirus 2 NEGATIVE NEGATIVE Final    Comment: (NOTE) SARS-CoV-2 target nucleic acids are NOT DETECTED.  The SARS-CoV-2 RNA is generally detectable in upper and lower respiratory specimens during the acute phase of infection. Negative results do not preclude SARS-CoV-2 infection, do not rule out co-infections with other pathogens, and should not be used as the sole basis for treatment or other patient management decisions. Negative results must be combined with clinical observations, patient history, and epidemiological information. The expected result is Negative.  Fact Sheet for Patients: 06/02/20  Fact Sheet for Healthcare Providers: HairSlick.no  This test is not yet approved or cleared by the [place] FDA and  has been authorized for detection and/or diagnosis of SARS-CoV-2 by FDA under an Emergency Use Authorization (EUA). This EUA will remain  in effect (meaning this test can be used) for the duration of the COVID-19 declaration under Se ction 564(b)(1) of the Act, 21 U.S.C. section 360bbb-3(b)(1), unless the authorization is terminated or revoked sooner.  Performed at [facility], [address] [place], [place] [address]   Body fluid culture w Gram Stain     Status: None (Preliminary result)   Collection Time: 06/01/20 10:21 AM   Specimen: PATH Cytology Peritoneal fluid  Result Value Ref Range Status   Specimen Description   Final    PERITONEAL Performed at [facility], [address] [place], [place] [address]    Special Requests   Final    PERITONEAL Performed at [facility], [address] [place], [place] [address]    Gram Stain   Final    RARE WBC PRESENT, PREDOMINANTLY MONONUCLEAR NO ORGANISMS SEEN    Culture   Final    NO GROWTH 2 DAYS Performed at [facility],  [address] [place], [place] [address]    Report Status PENDING  Incomplete   Studies/Results: [place] ASCITES (ABDOMEN LIMITED)  Result Date: 06/02/2020 CLINICAL DATA:  Abdominal distension, paracentesis the previous day EXAM: LIMITED ABDOMEN ULTRASOUND FOR ASCITES TECHNIQUE: Limited ultrasound survey for ascites was performed in all four abdominal quadrants. COMPARISON:  06/01/2020 FINDINGS: Small volume scattered residual/recurrent abdominal ascites. No significant pocket for safe paracentesis. IMPRESSION: Small volume residual/recurrent abdominal ascites. Paracentesis deferred. Electronically Signed   By: Corlis Leak M.D.   On: 06/02/2020 15:43   Medications:  I have reviewed the patient's current medications. Prior to Admission:  Medications Prior to Admission  Medication Sig Dispense Refill Last Dose  . albuterol (PROVENTIL) (2.5 MG/3ML) 0.083% nebulizer solution Inhale 3 mLs into the lungs every 6 (six) hours as needed.   Past Week at Unknown time  . atenolol (TENORMIN) 25 MG tablet Take 25 mg by mouth 2 (two) times daily.   Past Week at Unknown time  . PROAIR HFA 108 (90 Base) MCG/ACT inhaler Inhale 2 puffs into the lungs every 6 (six) hours as needed for wheezing.   05/31/2020 at AM  . SPIRIVA HANDIHALER 18 MCG inhalation capsule Place 1 capsule into inhaler and inhale daily.   Past Week at Unknown time  . spironolactone (ALDACTONE) 50 MG tablet Take 50 mg by mouth daily.   Past Week at Unknown time   Scheduled: . aspirin EC  81 mg Oral Daily  . atenolol  25 mg Oral BID  . enoxaparin (LOVENOX) injection  0.5 mg/kg Subcutaneous Q24H  . furosemide  40 mg Intravenous BID  . lactulose  20 g Oral BID  . sodium chloride flush  3 mL Intravenous Q12H  . spironolactone  100 mg Oral Daily  . tiotropium  1 capsule Inhalation Daily   Continuous: . sodium chloride    . albumin human     TJQ:ZESPQZ chloride, acetaminophen, albumin human, albuterol, ondansetron (ZOFRAN) IV, sodium chloride  flush Anti-infectives (From admission, onward)   None     Scheduled Meds: . aspirin EC  81 mg Oral Daily  . atenolol  25 mg Oral BID  . enoxaparin (LOVENOX) injection  0.5 mg/kg Subcutaneous Q24H  . furosemide  40 mg Intravenous BID  . lactulose  20 g Oral BID  . sodium chloride flush  3 mL Intravenous Q12H  . spironolactone  100 mg Oral Daily  . tiotropium  1 capsule Inhalation Daily   Continuous Infusions: . sodium chloride    . albumin human     PRN Meds:.sodium chloride, acetaminophen, albumin human, albuterol, ondansetron (ZOFRAN) IV, sodium chloride flush   Assessment: Principal Problem:   Ascites Active Problems:   Essential hypertension   COPD with chronic bronchitis (HCC)  Obesity, Class III, BMI 40-49.9 (morbid obesity) (HCC)   At risk for obstructive sleep apnea   Cirrhosis of liver with ascites (HCC)   Abdominal pain   Plan: Cirrhosis of liver with portal hypertension No evidence of PVT on CT with contrast Likely secondary to fatty liver Viral hepatitis panel negative Recommend EGD for variceal screening as outpatient Check hemoglobin A1c Discussed about dietary management of fatty liver Recommend follow-up with GI as outpatient  Ascites s/p therapeutic paracentesis, swelling of legs No evidence of SBP Total protein greater than 2.5, SAAG less than 1.1 Patient has mild right-sided pleural effusion, ?diastolic heart failure Patient is responding well to aggressive diuresis, currently on Lasix 40mg  IV twice daily and spironolactone 100 mg daily Can switch to Lasix 60 mg and spironolactone 150 mg daily Preserved renal function and electrolytes are normal  Strict low-sodium diet Patient may benefit from follow-up with cardiology as outpatient  GI will sign off at this time, please call back with questions or concerns   LOS: 2 days   Augustin Bun 06/03/2020, 4:40 PM

## 2020-06-03 NOTE — Plan of Care (Signed)

## 2020-06-03 NOTE — Progress Notes (Signed)
PROGRESS NOTE  Seth Haley  YBO:[id number] DOB: 11-Sep-1955 DOA: 05/31/2020 PCP: Simonne Martinet, NP   Brief Narrative: Seth Haley a 65 y.o.malewith medical history significant forhypertension, COPD, history of tobacco use, history of EtOH use, quit 2 years ago, obesity, presents to the emergency department for chief concerns of abdominal pain. He states that his abdomen has been bothering him for the last 2 to 3 weeks. He endorses distention and poor appetite. He states that he stopped taking his medications in the last week due to poor appetite and felt that it was making him retain more fluid. He endorses weight gain andbilaterallower extremity leg swellingand this has been ongoing forabout 1 year. Reports that in the last year he has been eating more however states that is not necessarily more unhealthy from his baseline diet. He states that he quit working completely and drinking alcohol completely 2 years ago when he was diagnosed with COPD. He denies fever, nausea, vomiting, chest pain, shortness of breath, dysuria, diarrhea. He states thatpreviously,he had a bowel movement every day and in the last year, he has been having BM twice per week.He states that it is very difficult to have a bowel movement and he has to strain constantly. Denies blood in his stool and melena.  Hospitalist called to admit given abdominal distention pain and unclear etiology for new onset ascites.  Assessment & Plan: Principal Problem:   Ascites Active Problems:   Essential hypertension   COPD with chronic bronchitis (HCC)   Obesity, Class III, BMI 40-49.9 (morbid obesity) (HCC)   At risk for obstructive sleep apnea   Cirrhosis of liver with ascites (HCC)   Abdominal pain  Chronic hypoxic respiratory failure: Due to COPD without exacerbation.  - Continue home Tx's including 2L O2.   Hepatic cirrhosis: Suspected to be related to fatty liver. Portal HTN with ascites. Viral  hepatitis panel negative, no PVT on contrasted CT. SAAG <1.1 with total protein >2.5.  - Titrate diuretics, planning to transition to lasix 60mg  daily and spironolactone 150mg  daily at discharge. Continue IV lasix today and monitor BMP closely.  - GI follow up, EGD as outpatient for variceal screening  - Na restriction - Repeat U/S without significant window of fluid. Will follow clinically.   Lower extremity swelling, ascites: No major ventricular dysfunction noted on echo.  - Consider repeat echo/cardiology follow up as outpatient.   Constipation:  - Continue scheduled lactulose, not just prn Tx as pt reports chronicity to this.  HTN:  - Continue atenolol, diuretics as above.   Hypokalemia:  - Supplement and monitor with Mg in AM.  Obesity: Estimated body mass index is 39.4 kg/m as calculated from the following:   Height as of this encounter: 5\' 7"  (1.702 m).   Weight as of this encounter: 114.1 kg.  DVT prophylaxis: SCDs Code Status: Full Family Communication: None at bedside Disposition Plan:  Status is: Inpatient  Remains inpatient appropriate because:IV treatments appropriate due to intensity of illness or inability to take PO   Dispo: The patient is from: Home              Anticipated d/c is to: Home              Patient currently is not medically stable to d/c.   Difficult to place patient No  Consultants:   GI  Procedures:   Paracentesis   Antimicrobials:  None   Subjective: Shortness of breath better, still with moderate-severe leg and abdominal  swelling which he finds difficult to quantify in comparison to prior.   Objective: Vitals:   06/03/20 0500 06/03/20 0804 06/03/20 1136 06/03/20 1624  BP:  112/68 113/76 101/65  Pulse:  67 67 65  Resp:  18 16   Temp:  98.4 F (36.9 C) 97.8 F (36.6 C) 98.4 F (36.9 C)  TempSrc:  Oral Oral Oral  SpO2:  98% 95% 96%  Weight: 114.1 kg     Height:        Intake/Output Summary (Last 24 hours) at  06/03/2020 1921 Last data filed at 06/03/2020 1919 Gross per 24 hour  Intake 600 ml  Output 1950 ml  Net -1350 ml   Filed Weights   05/31/20 1518 06/02/20 0500 06/03/20 0500  Weight: 121.6 kg 114.5 kg 114.1 kg    Gen: 65 y.o. male in no distress Pulm: Non-labored breathing supplemental O2, diminished at bases.  CV: Regular rate and rhythm. No murmur, rub, or gallop. UTD JVD, + pedal edema. GI: Abdomen soft, protuberant without definite fluid wave, +BS.   Ext: Warm, no deformities Skin: Chronic symmetric lower leg venous stasis changes. Neuro: Alert and oriented. No focal neurological deficits. Psych: Judgement and insight appear normal. Mood & affect appropriate.   Data Reviewed: I have personally reviewed following labs and imaging studies  CBC: Recent Labs  Lab 05/31/20 1520 06/01/20 0545 06/03/20 0424  WBC 9.8 8.6 9.9  NEUTROABS  --  7.2  --   HGB 14.5 13.4 14.2  HCT 44.5 42.4 45.6  MCV 88.8 90.8 91.6  PLT 270 237 235   Basic Metabolic Panel: Recent Labs  Lab 05/31/20 1520 06/01/20 0545 06/02/20 0331 06/03/20 0424  NA 137 137 140 136  K 4.1 3.6 4.5 3.4*  CL 90* 88* 89* 86*  CO2 33* 37* 41* 38*  GLUCOSE 104* 113* 147* 119*  BUN 14 12 9 9   CREATININE 0.82 0.71 0.83 0.80  CALCIUM 8.9 8.7* 9.2 8.4*   GFR: Estimated Creatinine Clearance: 111.1 mL/min (by C-G formula based on SCr of 0.8 mg/dL). Liver Function Tests: Recent Labs  Lab 05/31/20 1520 06/03/20 0424  AST 39 30  ALT 61* 52*  ALKPHOS 66 58  BILITOT 1.5* 0.7  PROT 7.2 6.0*  ALBUMIN 3.6 2.9*   Recent Labs  Lab 05/31/20 1520  LIPASE 24   No results for input(s): AMMONIA in the last 168 hours. Coagulation Profile: No results for input(s): INR, PROTIME in the last 168 hours. Cardiac Enzymes: No results for input(s): CKTOTAL, CKMB, CKMBINDEX, TROPONINI in the last 168 hours. BNP (last 3 results) No results for input(s): PROBNP in the last 8760 hours. HbA1C: No results for input(s): HGBA1C  in the last 72 hours. CBG: No results for input(s): GLUCAP in the last 168 hours. Lipid Profile: No results for input(s): CHOL, HDL, LDLCALC, TRIG, CHOLHDL, LDLDIRECT in the last 72 hours. Thyroid Function Tests: No results for input(s): TSH, T4TOTAL, FREET4, T3FREE, THYROIDAB in the last 72 hours. Anemia Panel: No results for input(s): VITAMINB12, FOLATE, FERRITIN, TIBC, IRON, RETICCTPCT in the last 72 hours. Urine analysis:    Component Value Date/Time   COLORURINE YELLOW (A) 05/31/2020 2039   APPEARANCEUR CLEAR (A) 05/31/2020 2039   LABSPEC >1.046 (H) 05/31/2020 2039   PHURINE 6.0 05/31/2020 2039   GLUCOSEU NEGATIVE 05/31/2020 2039   HGBUR NEGATIVE 05/31/2020 2039   BILIRUBINUR NEGATIVE 05/31/2020 2039   KETONESUR 80 (A) 05/31/2020 2039   PROTEINUR NEGATIVE 05/31/2020 2039   NITRITE NEGATIVE 05/31/2020 2039  LEUKOCYTESUR NEGATIVE 05/31/2020 2039   Recent Results (from the past 240 hour(s))  SARS CORONAVIRUS 2 (TAT 6-24 HRS) Nasopharyngeal Nasopharyngeal Swab     Status: None   Collection Time: 05/31/20  6:21 PM   Specimen: Nasopharyngeal Swab  Result Value Ref Range Status   SARS Coronavirus 2 NEGATIVE NEGATIVE Final    Comment: (NOTE) SARS-CoV-2 target nucleic acids are NOT DETECTED.  The SARS-CoV-2 RNA is generally detectable in upper and lower respiratory specimens during the acute phase of infection. Negative results do not preclude SARS-CoV-2 infection, do not rule out co-infections with other pathogens, and should not be used as the sole basis for treatment or other patient management decisions. Negative results must be combined with clinical observations, patient history, and epidemiological information. The expected result is Negative.  Fact Sheet for Patients: HairSlick.no  Fact Sheet for Healthcare Providers: quierodirigir.com  This test is not yet approved or cleared by the [place] FDA and  has  been authorized for detection and/or diagnosis of SARS-CoV-2 by FDA under an Emergency Use Authorization (EUA). This EUA will remain  in effect (meaning this test can be used) for the duration of the COVID-19 declaration under Se ction 564(b)(1) of the Act, 21 U.S.C. section 360bbb-3(b)(1), unless the authorization is terminated or revoked sooner.  Performed at [facility], [address] [place], [place] [address]   Body fluid culture w Gram Stain     Status: None (Preliminary result)   Collection Time: 06/01/20 10:21 AM   Specimen: PATH Cytology Peritoneal fluid  Result Value Ref Range Status   Specimen Description   Final    PERITONEAL Performed at [facility], [address], [place], [place] [address]    Special Requests   Final    PERITONEAL Performed at [facility], [address] [place], [place] [address]    Gram Stain   Final    RARE WBC PRESENT, PREDOMINANTLY MONONUCLEAR NO ORGANISMS SEEN    Culture   Final    NO GROWTH 2 DAYS Performed at [facility], [address] [place], [place] [address]    Report Status PENDING  Incomplete      Radiology Studies: [place] ASCITES (ABDOMEN LIMITED)  Result Date: 06/02/2020 CLINICAL DATA:  Abdominal distension, paracentesis the previous day EXAM: LIMITED ABDOMEN ULTRASOUND FOR ASCITES TECHNIQUE: Limited ultrasound survey for ascites was performed in all four abdominal quadrants. COMPARISON:  06/01/2020 FINDINGS: Small volume scattered residual/recurrent abdominal ascites. No significant pocket for safe paracentesis. IMPRESSION: Small volume residual/recurrent abdominal ascites. Paracentesis deferred. Electronically Signed   By: Corlis Leak M.D.   On: 06/02/2020 15:43    Scheduled Meds: . aspirin EC  81 mg Oral Daily  . atenolol  25 mg Oral BID  . enoxaparin (LOVENOX) injection  0.5 mg/kg Subcutaneous Q24H  . furosemide  40 mg Intravenous BID  . lactulose  20 g Oral BID  . sodium chloride  flush  3 mL Intravenous Q12H  . spironolactone  100 mg Oral Daily  . tiotropium  1 capsule Inhalation Daily   Continuous Infusions: . sodium chloride    . albumin human       LOS: 2 days   Time spent: 25 minutes.  Tyrone Nine, MD [facility] [email or web address] 06/03/2020, 7:21 PM

## 2020-06-04 LAB — COMPREHENSIVE METABOLIC PANEL
ALT: 46 U/L — ABNORMAL HIGH (ref 0–44)
AST: 26 U/L (ref 15–41)
Albumin: 3 g/dL — ABNORMAL LOW (ref 3.5–5.0)
Alkaline Phosphatase: 62 U/L (ref 38–126)
Anion gap: 12 (ref 5–15)
BUN: 13 mg/dL (ref 8–23)
CO2: 40 mmol/L — ABNORMAL HIGH (ref 22–32)
Calcium: 8.5 mg/dL — ABNORMAL LOW (ref 8.9–10.3)
Chloride: 85 mmol/L — ABNORMAL LOW (ref 98–111)
Creatinine, Ser: 0.93 mg/dL (ref 0.61–1.24)
GFR, Estimated: >60 mL/min (ref >60)
Glucose, Bld: 108 mg/dL — ABNORMAL HIGH (ref 70–99)
Potassium: 3.3 mmol/L — ABNORMAL LOW (ref 3.5–5.1)
Sodium: 137 mmol/L (ref 135–145)
Total Bilirubin: 0.9 mg/dL (ref 0.3–1.2)
Total Protein: 6.3 g/dL — ABNORMAL LOW (ref 6.5–8.1)

## 2020-06-04 LAB — MAGNESIUM: Magnesium: 1.8 mg/dL (ref 1.7–2.4)

## 2020-06-04 LAB — HEMOGLOBIN A1C
Hgb A1c MFr Bld: 5.4 % (ref 4.8–5.6)
Mean Plasma Glucose: 108.28 mg/dL

## 2020-06-04 MED ORDER — SPIRONOLACTONE 25 MG PO TABS
150.0000 mg | ORAL_TABLET | Freq: Every day | ORAL | Status: DC
  Administered 2020-06-04 – 2020-06-05 (×2): 150 mg via ORAL
  Filled 2020-06-04 (×2): qty 6

## 2020-06-04 MED ORDER — PROCHLORPERAZINE EDISYLATE 10 MG/2ML IJ SOLN
5.0000 mg | Freq: Four times a day (QID) | INTRAMUSCULAR | Status: DC | PRN
  Administered 2020-06-04: 5 mg via INTRAVENOUS
  Filled 2020-06-04 (×3): qty 1

## 2020-06-04 MED ORDER — FUROSEMIDE 40 MG PO TABS
60.0000 mg | ORAL_TABLET | Freq: Every day | ORAL | Status: DC
  Administered 2020-06-04 – 2020-06-05 (×2): 60 mg via ORAL
  Filled 2020-06-04 (×2): qty 1

## 2020-06-04 MED ORDER — POTASSIUM CHLORIDE CRYS ER 20 MEQ PO TBCR
40.0000 meq | EXTENDED_RELEASE_TABLET | Freq: Once | ORAL | Status: AC
  Administered 2020-06-04: 40 meq via ORAL
  Filled 2020-06-04: qty 2

## 2020-06-04 NOTE — TOC Progression Note (Addendum)
Transition of Care [facility]) - Progression Note    Patient Details  Name: Khyson Sebesta MRN: [id number] Date of Birth: Dec 19, 1955  Transition [facility] CM/SW Contact  Caryn Section, RN Phone Number: 06/04/2020, 2:17 PM  Clinical Narrative:   TOC in to see patient and family at bedside.  Patient stated that provider said he would discharge tomorrow and he was not sure that he should go home tomorrow.  TOC stated that provider would review his condition and discuss any changes with him in the morning.  Family states they would like to speak with provider, message left for Dr. Jarvis Newcomer to speak with family.  DME:  Shower chair ordered for patient.  Patient stated he is not sure if he wants the shower chair.  TOC explained that this was a recommendation and it was his decision if he would like the DME.  Patient would like it delivered to his room to view and measure and would make the decision at that point.  [employer] notified of need, will deliver to room.  TOC contact information given to patient and family.  TOC will follow through discharge.   Addendum 14:51:  Explained to patient and family that TOC is not able to order pulmonary rehab.  Patient should ask his PCP or Pulmonology for Pulmonary rehab following admission.  Verbalized understanding.   Expected Discharge Plan:  (Home TBD if home health but patient is independent) Barriers to Discharge: Continued Medical Work up  Expected Discharge Plan and Services Expected Discharge Plan:  (Home TBD if home health but patient is independent)   Discharge Planning Services: CM Consult   Living arrangements for the past 2 months: Single Family Home                                       Social Determinants of Health (SDOH) Interventions    Readmission Risk Interventions No flowsheet data found.

## 2020-06-04 NOTE — Progress Notes (Signed)
   06/04/20 1700  Unmeasured Output  Emesis Occurrence 1  Notified MD Grunz no new orders

## 2020-06-04 NOTE — Progress Notes (Signed)
c/o "nausea, fullness, gassy indigestion" I gave zofran at 1347. I will give compazine now. sister is requesting meds for other symptoms.  Paged MD

## 2020-06-04 NOTE — Progress Notes (Signed)
PROGRESS NOTE  Seth Haley  YIA:[id number] DOB: 1956/01/22 DOA: 05/31/2020 PCP: Simonne Martinet, NP   Brief Narrative: Seth Haley a 65 y.o.malewith medical history significant forhypertension, COPD, history of tobacco use, history of EtOH use, quit 2 years ago, obesity, presented to the ED 4/24 with abdominal swelling and discomfort worsening for a couple weeks as well as leg swelling which has been ongoing for years.   Work up revealed normal CBC, normal platelets, mildly elevated AST and total bilirubin, BMP normal. CT abdomen/pelvis with contrast revealed moderate to large volume ascites, cirrhosis of liver with portal hypertension, splenomegaly. Subsequently underwent paracentesis with removal of 5 L of yellow straw-colored fluid.  He was admitted to the hospitalist service with GI consulted, diuretics initiated with IV lasix and spironolactone with stability in metabolic profile. Echocardiogram was essentially normal. Weight has declined to 252lbs and stabilized there.  Assessment & Plan: Principal Problem:   Ascites Active Problems:   Essential hypertension   COPD with chronic bronchitis (HCC)   Obesity, Class III, BMI 40-49.9 (morbid obesity) (HCC)   At risk for obstructive sleep apnea   Cirrhosis of liver with ascites (HCC)   Abdominal pain  Chronic hypoxic respiratory failure: Due to COPD without exacerbation.  - Continue home Tx's including 2L O2.   Hepatic cirrhosis: Suspected to be related to fatty liver. Portal HTN with ascites. Viral hepatitis panel negative, no PVT on contrasted CT. HbA1c 5.4%. SAAG <1.1 with total protein >2.5. Cytology showing no malignant cells.  - Titrate diuretics, will transition to lasix 60mg  daily and spironolactone 150mg  daily. Monitor for effectiveness of diuresis and check BMP in AM.  - Recheck U/S to see if therapeutic paracentesis can be repeated. - GI follow up, EGD as outpatient for variceal screening  - Na  restriction  Lower extremity swelling, ascites: No major ventricular dysfunction noted on echo.  - Consider repeat echo/cardiology follow up as outpatient.   Constipation:  - Continue scheduled lactulose, not just prn Tx as pt reports chronicity to this.  HTN:  - Continue atenolol, diuretics as above.   Hypokalemia:  - Refractory to supplementation, will repeat supplement as we deescalate loop diuretic, monitor with Mg in AM.  Obesity: Estimated body mass index is 39.6 kg/m as calculated from the following:   Height as of this encounter: 5\' 7"  (1.702 m).   Weight as of this encounter: 114.7 kg.  DVT prophylaxis: SCDs Code Status: Full Family Communication: None at bedside. Disposition Plan:  Status is: Inpatient  Remains inpatient appropriate because:IV treatments appropriate due to intensity of illness or inability to take PO   Dispo: The patient is from: Home              Anticipated d/c is to: Home              Patient currently is not medically stable to d/c.   Difficult to place patient No  Consultants:   GI  Procedures:   Paracentesis   Antimicrobials:  None   Subjective: Pt reports stable abdominal swelling for past couple days, still uncomfortable. Leg swelling significantly better than prior baseline. No shortness of breath or chest pain. No bleeding.   Objective: Vitals:   06/04/20 0500 06/04/20 0806 06/04/20 1054 06/04/20 1118  BP:  118/81 120/72 124/78  Pulse:  (!) 54 63 79  Resp:  17  18  Temp:  98 F (36.7 C)  97.8 F (36.6 C)  TempSrc:      SpO2:  98%  97%  Weight: 114.7 kg     Height:        Intake/Output Summary (Last 24 hours) at 06/04/2020 1524 Last data filed at 06/04/2020 1200 Gross per 24 hour  Intake 480 ml  Output 2050 ml  Net -1570 ml   Filed Weights   06/02/20 0500 06/03/20 0500 06/04/20 0500  Weight: 114.5 kg 114.1 kg 114.7 kg   Gen: 65 y.o. male in no distress Pulm: Nonlabored breathing supplemental oxygen. Clear,  diminished at bases. CV: Regular rate and rhythm. No murmur, rub, or gallop. No definite JVD, 1+ dependent edema improved from prior exam. GI: Abdomen distended nontender with normoactive bowel sounds.  Ext: Warm, no deformities Skin: No new rashes, lesions or ulcers on visualized skin. Venous stasis bilateral LEs stable. Neuro: Alert and oriented. No focal neurological deficits. Psych: Judgement and insight appear fair. Mood euthymic & affect congruent. Behavior is appropriate.    Data Reviewed: I have personally reviewed following labs and imaging studies  CBC: Recent Labs  Lab 05/31/20 1520 06/01/20 0545 06/03/20 0424  WBC 9.8 8.6 9.9  NEUTROABS  --  7.2  --   HGB 14.5 13.4 14.2  HCT 44.5 42.4 45.6  MCV 88.8 90.8 91.6  PLT 270 237 235   Basic Metabolic Panel: Recent Labs  Lab 05/31/20 1520 06/01/20 0545 06/02/20 0331 06/03/20 0424 06/04/20 0416  NA 137 137 140 136 137  K 4.1 3.6 4.5 3.4* 3.3*  CL 90* 88* 89* 86* 85*  CO2 33* 37* 41* 38* 40*  GLUCOSE 104* 113* 147* 119* 108*  BUN 14 12 9 9 13   CREATININE 0.82 0.71 0.83 0.80 0.93  CALCIUM 8.9 8.7* 9.2 8.4* 8.5*  MG  --   --   --   --  1.8   GFR: Estimated Creatinine Clearance: 95.8 mL/min (by C-G formula based on SCr of 0.93 mg/dL). Liver Function Tests: Recent Labs  Lab 05/31/20 1520 06/03/20 0424 06/04/20 0416  AST 39 30 26  ALT 61* 52* 46*  ALKPHOS 66 58 62  BILITOT 1.5* 0.7 0.9  PROT 7.2 6.0* 6.3*  ALBUMIN 3.6 2.9* 3.0*   Recent Labs  Lab 05/31/20 1520  LIPASE 24   No results for input(s): AMMONIA in the last 168 hours. Coagulation Profile: No results for input(s): INR, PROTIME in the last 168 hours. Cardiac Enzymes: No results for input(s): CKTOTAL, CKMB, CKMBINDEX, TROPONINI in the last 168 hours. BNP (last 3 results) No results for input(s): PROBNP in the last 8760 hours. HbA1C: Recent Labs    06/04/20 0416  HGBA1C 5.4   CBG: No results for input(s): GLUCAP in the last 168  hours. Lipid Profile: No results for input(s): CHOL, HDL, LDLCALC, TRIG, CHOLHDL, LDLDIRECT in the last 72 hours. Thyroid Function Tests: No results for input(s): TSH, T4TOTAL, FREET4, T3FREE, THYROIDAB in the last 72 hours. Anemia Panel: No results for input(s): VITAMINB12, FOLATE, FERRITIN, TIBC, IRON, RETICCTPCT in the last 72 hours. Urine analysis:    Component Value Date/Time   COLORURINE YELLOW (A) 05/31/2020 2039   APPEARANCEUR CLEAR (A) 05/31/2020 2039   LABSPEC >1.046 (H) 05/31/2020 2039   PHURINE 6.0 05/31/2020 2039   GLUCOSEU NEGATIVE 05/31/2020 2039   HGBUR NEGATIVE 05/31/2020 2039   BILIRUBINUR NEGATIVE 05/31/2020 2039   KETONESUR 80 (A) 05/31/2020 2039   PROTEINUR NEGATIVE 05/31/2020 2039   NITRITE NEGATIVE 05/31/2020 2039   LEUKOCYTESUR NEGATIVE 05/31/2020 2039   Recent Results (from the past 240 hour(s))  SARS CORONAVIRUS 2 (  TAT 6-24 HRS) Nasopharyngeal Nasopharyngeal Swab     Status: None   Collection Time: 05/31/20  6:21 PM   Specimen: Nasopharyngeal Swab  Result Value Ref Range Status   SARS Coronavirus 2 NEGATIVE NEGATIVE Final    Comment: (NOTE) SARS-CoV-2 target nucleic acids are NOT DETECTED.  The SARS-CoV-2 RNA is generally detectable in upper and lower respiratory specimens during the acute phase of infection. Negative results do not preclude SARS-CoV-2 infection, do not rule out co-infections with other pathogens, and should not be used as the sole basis for treatment or other patient management decisions. Negative results must be combined with clinical observations, patient history, and epidemiological information. The expected result is Negative.  Fact Sheet for Patients: HairSlick.no  Fact Sheet for Healthcare Providers: quierodirigir.com  This test is not yet approved or cleared by the [place] FDA and  has been authorized for detection and/or diagnosis of SARS-CoV-2 by FDA under an  Emergency Use Authorization (EUA). This EUA will remain  in effect (meaning this test can be used) for the duration of the COVID-19 declaration under Se ction 564(b)(1) of the Act, 21 U.S.C. section 360bbb-3(b)(1), unless the authorization is terminated or revoked sooner.  Performed at [facility], [address], [place], [place] [address]   Body fluid culture w Gram Stain     Status: None (Preliminary result)   Collection Time: 06/01/20 10:21 AM   Specimen: PATH Cytology Peritoneal fluid  Result Value Ref Range Status   Specimen Description   Final    PERITONEAL Performed at [facility], [address], [place], [place] [address]    Special Requests   Final    PERITONEAL Performed at [facility], [address] [place], [place] [address]    Gram Stain   Final    RARE WBC PRESENT, PREDOMINANTLY MONONUCLEAR NO ORGANISMS SEEN    Culture   Final    NO GROWTH 3 DAYS Performed at [facility], [address] [place], [place] [address]    Report Status PENDING  Incomplete      Radiology Studies: No results found.  Scheduled Meds: . aspirin EC  81 mg Oral Daily  . atenolol  25 mg Oral BID  . enoxaparin (LOVENOX) injection  0.5 mg/kg Subcutaneous Q24H  . furosemide  60 mg Oral Daily  . lactulose  20 g Oral BID  . sodium chloride flush  3 mL Intravenous Q12H  . spironolactone  150 mg Oral Daily  . tiotropium  1 capsule Inhalation Daily   Continuous Infusions: . sodium chloride    . albumin human       LOS: 3 days   Time spent: 25 minutes.  Tyrone Nine, MD [facility] [email or web address] 06/04/2020, 3:24 PM

## 2020-06-04 NOTE — Care Management Important Message (Signed)
Important Message  Patient Details  Name: Seth Haley MRN: [id number] Date of Birth: 20-Sep-1955   Medicare Important Message Given:  Yes  Reviewed the Important Message from Medicare (IMM) with patient and his wife and asked him to sign the initial IMM.  Original sent to HIM for scanning. I thanked him for his time.   [name] 06/04/2020, 1:45 PM

## 2020-06-04 NOTE — Progress Notes (Signed)
Sister would like to be present during discharge. Please let sister know when pt is discharging so she can come to hospital

## 2020-06-04 NOTE — Progress Notes (Signed)
PT Cancellation Note  Patient Details Name: Trejan Buda MRN: [id number] DOB: November 27, 1955   Cancelled Treatment:    Reason Eval/Treat Not Completed: Patient declined to participate with PT services this date secondary to "acid reflux" and stated he has notified nursing. Will attempt to see pt at a future date/time as medically appropriate.     Ovidio Hanger PT, DPT 06/04/20, 5:18 PM

## 2020-06-05 ENCOUNTER — Inpatient Hospital Stay: Payer: Medicare Other

## 2020-06-05 LAB — BODY FLUID CULTURE W GRAM STAIN: Culture: NO GROWTH

## 2020-06-05 LAB — COMPREHENSIVE METABOLIC PANEL
ALT: 48 U/L — ABNORMAL HIGH (ref 0–44)
AST: 31 U/L (ref 15–41)
Albumin: 3 g/dL — ABNORMAL LOW (ref 3.5–5.0)
Alkaline Phosphatase: 63 U/L (ref 38–126)
Anion gap: 13 (ref 5–15)
BUN: 13 mg/dL (ref 8–23)
CO2: 39 mmol/L — ABNORMAL HIGH (ref 22–32)
Calcium: 8.6 mg/dL — ABNORMAL LOW (ref 8.9–10.3)
Chloride: 84 mmol/L — ABNORMAL LOW (ref 98–111)
Creatinine, Ser: 0.93 mg/dL (ref 0.61–1.24)
GFR, Estimated: >60 mL/min (ref >60)
Glucose, Bld: 110 mg/dL — ABNORMAL HIGH (ref 70–99)
Potassium: 4.3 mmol/L (ref 3.5–5.1)
Sodium: 136 mmol/L (ref 135–145)
Total Bilirubin: 1.1 mg/dL (ref 0.3–1.2)
Total Protein: 6.5 g/dL (ref 6.5–8.1)

## 2020-06-05 MED ORDER — SPIRONOLACTONE 50 MG PO TABS: 150.0000 mg | ORAL_TABLET | Freq: Every day | ORAL | 1 refills | Status: AC

## 2020-06-05 MED ORDER — LACTULOSE 10 GM/15ML PO SOLN: 10.0000 g | Freq: Two times a day (BID) | ORAL | 0 refills | Status: DC

## 2020-06-05 MED ORDER — ONDANSETRON 4 MG PO TBDP: 4.0000 mg | ORAL_TABLET | Freq: Three times a day (TID) | ORAL | 0 refills | Status: DC | PRN

## 2020-06-05 MED ORDER — FUROSEMIDE 20 MG PO TABS: 60.0000 mg | ORAL_TABLET | Freq: Every day | ORAL | 1 refills | Status: AC

## 2020-06-05 MED ORDER — PROCHLORPERAZINE MALEATE 5 MG PO TABS: 5.0000 mg | ORAL_TABLET | Freq: Four times a day (QID) | ORAL | 0 refills | Status: DC | PRN

## 2020-06-05 NOTE — Discharge Instructions (Signed)
Cirrhosis  Cirrhosis is long-term (chronic) liver injury. The liver is the body's largest internal organ, and it performs many functions. It converts food into energy, removes toxic material from the blood, makes important proteins, and absorbs necessary vitamins from food. In cirrhosis, healthy liver cells are replaced by scar tissue. This prevents blood from flowing through the liver and makes it difficult for the liver to complete its functions. What are the causes? Common causes of this condition are hepatitis C and long-term alcohol abuse. Other causes include:  Nonalcoholic fatty liver disease (NAFLD). This happens when fat is deposited in the liver by causes other than alcohol.  Hepatitis B infection.  Autoimmune hepatitis. In this condition, the body's defense system (immune system) mistakenly attacks the liver cells, causing inflammation.  Diseases that cause blockage of ducts inside the liver.  Inherited liver diseases, such as hemochromatosis. This is one of the most common inherited liver diseases. In this disease, deposits of iron collect in the liver and other organs.  Reactions to certain long-term medicines, such as amiodarone, a heart medicine.  Parasitic infections. These include schistosomiasis, which is caused by a flatworm.  Long-term contact to certain toxins. These toxins include certain organic solvents, such as toluene and chloroform. What increases the risk? You are more likely to develop this condition if:  You have certain types of viral hepatitis.  You abuse alcohol, especially if you are male.  You are overweight.  You use IV drugs and share needles.  You have unprotected sex with someone who has viral hepatitis. What are the signs or symptoms? You may not have any signs and symptoms at first. Symptoms may not develop until the damage to your liver starts to get worse. Early symptoms may include:  Weakness and tiredness (fatigue).  Changes in  sleep patterns or having trouble sleeping.  Itchiness.  Tenderness in the right-upper part of your abdomen.  Weight loss and muscle loss.  Nausea.  Loss of appetite. Later symptoms may include:  Fatigue or weakness that is getting worse.  Yellow skin and eyes (jaundice).  Buildup of fluid in the abdomen (ascites). You may notice that your clothes are tight around your waist.  Weight gain and swelling of the feet and ankles (edema).  Trouble breathing.  Easy bruising and bleeding.  Vomiting blood, or black or bloody stool.  Mental confusion. How is this diagnosed? Your health care provider may suspect cirrhosis based on your symptoms and medical history, especially if you have other medical conditions or a history of alcohol abuse. Your health care provider will do a physical exam to feel your liver and to check for signs of cirrhosis. Tests may include:  Blood tests to check: ? For hepatitis B or C. ? Kidney function. ? Liver function.  Imaging tests such as: ? MRI or CT scan to look for changes seen in advanced cirrhosis. ? Ultrasound to see if normal liver tissue is being replaced by scar tissue.  A procedure in which a long needle is used to take a sample of liver tissue to be checked in a lab (biopsy). Liver biopsy can confirm the diagnosis of cirrhosis. How is this treated? Treatment for this condition depends on how damaged your liver is and what caused the damage. It may include treating the symptoms of cirrhosis, or treating the underlying causes to slow the damage. Treatment may include:  Making lifestyle changes, such as: ? Eating a healthy diet. You may need to work with your health care   provider or a dietitian to develop an eating plan. ? Restricting salt intake. ? Maintaining a healthy weight. ? Not abusing drugs or alcohol.  Taking medicines to: ? Treat liver infections or other infections. ? Control itching. ? Reduce fluid buildup. ? Reduce certain  blood toxins. ? Reduce risk of bleeding from enlarged blood vessels in the stomach or esophagus (varices).  Liver transplant. In this procedure, a liver from a donor is used to replace your diseased liver. This is done if cirrhosis has caused liver failure. Other treatments and procedures may be done depending on the problems that you get from cirrhosis. Common problems include liver-related kidney failure (hepatorenal syndrome). Follow these instructions at home:  Take medicines only as told by your health care provider. Do not use medicines that are toxic to your liver. Ask your health care provider before taking any new medicines, including over-the-counter medicines such as NSAIDs.  Rest as needed.  Eat a well-balanced diet.  Limit your salt or water intake, if your health care provider asks you to do this.  Do not drink alcohol. This is especially important if you routinely take acetaminophen.  Keep all follow-up visits. This is important.   Contact a health care provider if you:  Have fatigue or weakness that is getting worse.  Develop swelling of the hands, feet, or legs, or a buildup of fluid in the abdomen (ascites).  Have a fever or chills.  Develop loss of appetite.  Have nausea or vomiting.  Develop jaundice.  Develop easy bruising or bleeding. Get help right away if you:  Vomit bright red blood or a material that looks like coffee grounds.  Have blood in your stools.  Notice that your stools appear black and tarry.  Become confused.  Have chest pain or trouble breathing. These symptoms may represent a serious problem that is an emergency. Do not wait to see if the symptoms will go away. Get medical help right away. Call your local emergency services (911 in the [place] Do not drive yourself to the hospital. Summary  Cirrhosis is chronic liver injury. Common causes are hepatitis C and long-term alcohol abuse.  Tests used to diagnose cirrhosis include blood  tests, imaging tests, and liver biopsy.  Treatment for this condition involves treating the underlying cause. Avoid alcohol, drugs, salt, and medicines that may damage your liver.  Get help right away if you vomit bright red blood or a material that looks like coffee grounds. This information is not intended to replace advice given to you by your health care provider. Make sure you discuss any questions you have with your health care provider. Document Revised: 11/07/2019 Document Reviewed: 11/07/2019 Elsevier Patient Education  2021 [employer] Inc.   Ascites  Ascites is a collection of too much fluid in the abdomen. Ascites can range from mild to severe. If ascites is not treated, it can get worse. What are the causes? This condition may be caused by:  A liver condition called cirrhosis. This is the most common cause of ascites.  Long-term (chronic) or alcoholic hepatitis.  Infection or inflammation in the abdomen.  Cancer in the abdomen.  Heart failure.  Kidney disease.  Inflammation of the pancreas.  Clots in the veins of the liver. What are the signs or symptoms? Symptoms of this condition include:  A feeling of fullness in the abdomen. This is common.  An increase in the size of the abdomen or waist.  Swelling in the legs.  Swelling of the scrotum.  Difficulty breathing.  Pain in the abdomen.  Sudden weight gain. If the condition is mild, you may not have symptoms. How is this diagnosed? This condition is diagnosed based on your medical history and a physical exam. Your health care provider may order imaging tests, such as an ultrasound or CT scan of your abdomen. How is this treated? Treatment for this condition depends on the cause of the ascites. It may include:  Taking a pill to make you urinate. This is called a water pill (diuretic pill).  Strictly reducing your salt (sodium) intake. Salt can cause extra fluid to be kept (retained) in the body, and  this makes ascites worse.  Having a procedure to remove fluid from your abdomen (paracentesis).  Having a procedure that connects two of the major veins within your liver and relieves pressure on your liver. This is called a TIPS procedure (transjugular intrahepatic portosystemic shunt procedure).  Placement of a drainage catheter (peritoneovenous shunt) to manage the extra fluid in the abdomen. Ascites may go away or improve when the condition that caused it is treated. Follow these instructions at home: Eating and drinking  Keep track of your weight. To do this, weigh yourself at the same time every day and write down your weight.  Try not to eat salty (high-sodium) foods.  Follow any instructions that your health care provider gives you about how much to drink.  Keep track of how much you drink and any changes in how much or how often you urinate. General instructions  Report any changes in your health to your health care provider, especially if you develop new symptoms or your symptoms get worse.  Take over-the-counter and prescription medicines only as told by your health care provider.  Keep all follow-up visits. This is important. Contact a health care provider if:  You gain more than 3 lb (1.36 kg) in 3 days.  Your waist size increases.  You have new swelling in your legs.  The swelling in your legs gets worse. Get help right away if:  You have a fever or chills.  You are confused.  You have new or worsening breathing trouble.  You have new or worsening pain in your abdomen.  You have new or worsening swelling in the scrotum. Summary  Ascites is a collection of too much fluid in the abdomen.  Ascites may be caused by various conditions, such as cirrhosis, hepatitis, cancer, or congestive heart failure.  Symptoms may include swelling of the abdomen and other areas due to extra fluid in the body.  Treatments may involve dietary changes, medicines, or  procedures. This information is not intended to replace advice given to you by your health care provider. Make sure you discuss any questions you have with your health care provider. Document Revised: 10/08/2019 Document Reviewed: 10/08/2019 Elsevier Patient Education  2021 [employer].

## 2020-06-05 NOTE — Discharge Summary (Signed)
Physician Discharge Summary  Seth Haley ONG:[id number] DOB: 1955-04-06 DOA: 05/31/2020  PCP: Dr. Larwance SachsBabaoff  Admit date: 05/31/2020 Discharge date: 06/05/2020  Admitted From: Home Disposition: Home   Recommendations for Outpatient Follow-up:  1. Follow up with PCP in 1-2 weeks. Monitor CMP, volume status. Started lasix 60mg  po daily and increased spironolactone to 150mg  po daily 2. Follow up with GI for EGD for esophageal varices screening  Home Health: None Equipment/Devices: Continue 2L O2 Discharge Condition: Stable CODE STATUS: Full Diet recommendation: Sodium restriction  Brief/Interim Summary: Seth Haley a 65 y.o.malewith medical history significant forhypertension, COPD, history of tobacco use, history of EtOH use, quit 2 years ago, obesity, presented to the ED 4/24 with abdominal swelling and discomfort worsening for a couple weeks as well as leg swelling which has been ongoing for years.   Work up revealed normal CBC, normal platelets, mildly elevated AST and total bilirubin, BMP normal. CT abdomen/pelvis with contrast revealed moderate to large volume ascites,cirrhosis of liver with portal hypertension, splenomegaly. Subsequently underwent paracentesis with removal of 5 L of yellow straw-colored fluid.  He was admitted to the hospitalist service with GI consulted, diuretics initiated with IV lasix and spironolactone with stability in metabolic profile. Echocardiogram was essentially normal. Weight has declined to 250lbs with improvement in abdominal and leg swelling. Repeat U/S on day of discharge did not show ascitic fluid sufficient for paracentesis. Will continue diuretics per GI recommendations at discharge.  Discharge Diagnoses:  Principal Problem:   Ascites Active Problems:   Essential hypertension   COPD with chronic bronchitis (HCC)   Obesity, Class III, BMI 40-49.9 (morbid obesity) (HCC)   At risk for obstructive sleep apnea   Cirrhosis of  liver with ascites (HCC)   Abdominal pain  Chronic hypoxic respiratory failure: Due to COPD without exacerbation.  - Continue home Tx's including 2L O2.   Hepatic cirrhosis: Suspected to be related to fatty liver. Portal HTN with ascites. Viral hepatitis panel negative, no PVT on contrasted CT. HbA1c 5.4%. SAAG <1.1 with total protein >2.5. Cytology showing no malignant cells.  - Titrate diuretics, will transition to lasix 60mg  daily and spironolactone 150mg  daily. Monitor for effectiveness of diuresis and check BMP at follow up - Recheck U/S on day of discharge did not support repeat therapeutic paracentesis (insufficient fluid) - GI follow up, EGD as outpatient for variceal screening  - Na restriction  Lower extremity swelling, ascites: No major ventricular dysfunction noted on echo.  - Consider repeat echo/cardiology follow up as outpatient.   Constipation:  - Continue scheduled lactulose, not just prn Tx as pt reports chronicity to this.  HTN:  - Continue atenolol, diuretics as above.   Hypokalemia: Resolved with dose adjustments, decrease lasix, increase spironolactone.   Obesity: Estimated body mass index is 39.16 kg/m   Discharge Instructions Discharge Instructions    Call MD for:  difficulty breathing, headache or visual disturbances   Complete by: As directed    Call MD for:  persistant nausea and vomiting   Complete by: As directed    Call MD for:  temperature >100.4   Complete by: As directed    Diet - low sodium heart healthy   Complete by: As directed    Discharge instructions   Complete by: As directed    You were admitted for swelling in the legs and abdomen related to liver cirrhosis. Medications to help manage this have been changed with good tolerance and stable labs. GI has recommended outpatient follow up but  no further inpatient work up or treatments. Repeat ultrasound this morning confirmed there is no reaccumulation of fluid sufficient enough to  repeat paracentesis. You are stable for discharge with the following recommendations:  - Avoid alcohol as you were - Take frequent small volume meals. You may use zofran as needed for nausea and vomiting, and if needed you can also take compazine if zofran is not sufficiently improving symptoms.  - Follow 2 gram daily sodium restriction. - Take lasix and spironolactone every day. New prescriptions were sent to your [facility] pharmacy.  - Follow up with GI as discussed.  - Follow up with your PCP next week for repeat labs and weight recheck. Your discharge weight is down to 250lbs. - You you develop worsening shortness of breath, abdominal or leg swelling, or inability to keep fluids down, seek medical attention right away.   Increase activity slowly   Complete by: As directed      Allergies as of 06/05/2020      Reactions   Sulfa Antibiotics Other (See Comments)      Medication List    TAKE these medications   albuterol (2.5 MG/3ML) 0.083% nebulizer solution Commonly known as: PROVENTIL Inhale 3 mLs into the lungs every 6 (six) hours as needed.   ProAir HFA 108 (90 Base) MCG/ACT inhaler Generic drug: albuterol Inhale 2 puffs into the lungs every 6 (six) hours as needed for wheezing.   atenolol 25 MG tablet Commonly known as: TENORMIN Take 25 mg by mouth 2 (two) times daily.   furosemide 20 MG tablet Commonly known as: LASIX Take 3 tablets (60 mg total) by mouth daily. Start taking on: June 06, 2020   lactulose 10 GM/15ML solution Commonly known as: CHRONULAC Take 15 mLs (10 g total) by mouth 2 (two) times daily.   ondansetron 4 MG disintegrating tablet Commonly known as: ZOFRAN-ODT Take 1 tablet (4 mg total) by mouth every 8 (eight) hours as needed for nausea or vomiting.   prochlorperazine 5 MG tablet Commonly known as: COMPAZINE Take 1 tablet (5 mg total) by mouth every 6 (six) hours as needed for refractory nausea / vomiting.   Spiriva HandiHaler 18 MCG inhalation  capsule Generic drug: tiotropium Place 1 capsule into inhaler and inhale daily.   spironolactone 50 MG tablet Commonly known as: ALDACTONE Take 3 tablets (150 mg total) by mouth daily. What changed: how much to take       Follow-up Information    Kandyce Rud, MD. Schedule an appointment as soon as possible for a visit in 1 week.   Specialty: Family Medicine Contact information: [address] [facility] [place] [address] [phone]        Schedule an appointment as soon as possible for a visit with Toney Reil, MD.   Specialty: Gastroenterology Contact information: [address] [place] [address] [phone]              Allergies  Allergen Reactions  . Sulfa Antibiotics Other (See Comments)    Consultations:  GI  IR  Procedures/Studies: CT ABDOMEN PELVIS W CONTRAST  Result Date: 05/31/2020 CLINICAL DATA:  Abdominal distension for 6 weeks. EXAM: CT ABDOMEN AND PELVIS WITH CONTRAST TECHNIQUE: Multidetector CT imaging of the abdomen and pelvis was performed using the standard protocol following bolus administration of intravenous contrast. CONTRAST:  OMNIPAQUE IOHEXOL 300 MG/ML  SOLN COMPARISON:  None. FINDINGS: Lower chest: There is a small right pleural effusion with  minimal overlying atelectasis. The heart is normal in size. No pericardial effusion. Hepatobiliary: Contour irregularity of the liver and prominent hepatic fissures suspicious for cirrhosis. The portal and hepatic veins are patent. The splenic vein is patent. No worrisome hepatic lesions or intrahepatic biliary dilatation. The gallbladder demonstrates scattered gallstones but no findings to suggest acute cholecystitis. Normal caliber common bile duct. Pancreas: No mass, inflammation or ductal dilatation. Spleen: Mild splenomegaly. Spleen measures 14.5 x 13.0 x 9.5 cm. No splenic lesions. Adrenals/Urinary Tract: Adrenal glands and  kidneys are unremarkable. Simple right renal cyst noted. No renal calculi or bladder calculi. Stomach/Bowel: The stomach, duodenum, small bowel and colon are grossly normal. No acute inflammatory process, mass lesions or obstructive findings. Descending colon and sigmoid colon diverticulosis without findings for acute diverticulitis. The terminal ileum is normal. The appendix is not identified for certain but no findings to suggest appendicitis. Vascular/Lymphatic: Significant age advanced atherosclerotic calcifications involving the aorta and branch vessels. There is a 5.1 x 4.7 cm infrarenal abdominal aortic aneurysm with moderate mural thrombus. This ends just above the iliac artery bifurcation. The major venous structures are patent. No mesenteric or retroperitoneal mass or adenopathy. Reproductive: The prostate gland and seminal vesicles are unremarkable. Other: Moderate to large volume abdominal/pelvic ascites. I do not see any obvious omental or peritoneal surface lesions. Small right inguinal hernia containing fat. Moderate-sized periumbilical abdominal wall hernia containing fat and a small bowel loop no findings for obstruction or incarceration. Musculoskeletal: No significant bony findings. IMPRESSION: 1. CT findings suspicious for cirrhosis with portal venous hypertension, splenomegaly and moderate to large volume abdominal/pelvic ascites. 2. No worrisome hepatic lesions. 3. Periumbilical abdominal wall hernia containing fat and a small bowel loop but no findings for incarceration or obstruction. 4. Cholelithiasis without findings to suggest acute cholecystitis. 5. 5.1 x 4.7 cm infrarenal abdominal aortic aneurysm. Recommend follow-up CT/MR every 6 months and vascular consultation. This recommendation follows ACR consensus guidelines: White Paper of the ACR Incidental Findings Committee II on Vascular Findings. J Am Coll Radiol 2013; 10:789-794. 6. Age advanced atherosclerotic calcifications involving  the aorta and branch vessels. 7. Small right pleural effusion with minimal overlying atelectasis. 8. Aortic atherosclerosis. Aortic Atherosclerosis (ICD10-I70.0). Electronically Signed   By: Rudie Meyer M.D.   On: 05/31/2020 17:38   US Paracentesis  Result Date: 06/01/2020 INDICATION: Patient with history of hypertension and former alcohol use. Presented to the emergency department with abdominal pain and distension. Request is for therapeutic and diagnostic paracentesis. Maximum of 5 L. EXAM: ULTRASOUND GUIDED THERAPEUTIC AND DIAGNOSTIC PARACENTESIS MEDICATIONS: Lidocaine 1% 10 mL COMPLICATIONS: None immediate. PROCEDURE: Informed written consent was obtained from the patient after a discussion of the risks, benefits and alternatives to treatment. A timeout was performed prior to the initiation of the procedure. Initial ultrasound scanning demonstrates a large amount of ascites within the right lower abdominal quadrant. The right lower abdomen was prepped and draped in the usual sterile fashion. 1% lidocaine was used for local anesthesia. Following this, a 19 gauge, 7-cm, Yueh catheter was introduced. An ultrasound image was saved for documentation purposes. The paracentesis was performed. The catheter was removed and a dressing was applied. The patient tolerated the procedure well without immediate post procedural complication. FINDINGS: A total of approximately 5 L of straw-colored fluid was removed. Samples were sent to the laboratory as requested by the clinical team. IMPRESSION: Successful ultrasound-guided therapeutic and diagnostic paracentesis yielding 5 L liters of peritoneal fluid. Read by: Anders Grant, NP Electronically Signed   By:  Simonne Come M.D.   On: 06/01/2020 10:39   DG Chest Port 1 View  Result Date: 06/01/2020 CLINICAL DATA:  Umbilical abdominal pain for 1.5 months. History of COPD. EXAM: PORTABLE CHEST 1 VIEW COMPARISON:  CT 09/11/2015 FINDINGS: Heart size and pulmonary  vascularity are normal for technique. No airspace disease or consolidation in the lungs. Small right pleural effusion with basilar atelectasis. No pneumothorax. Mediastinal contours appear intact. IMPRESSION: Small right pleural effusion with basilar atelectasis. Electronically Signed   By: Burman Nieves M.D.   On: 06/01/2020 00:24   ECHOCARDIOGRAM COMPLETE  Result Date: 06/01/2020    ECHOCARDIOGRAM REPORT   Patient Name:   Seth Haley Date of Exam: 06/01/2020 Medical Rec #:  [id number]          Height:       67.0 in Accession #:    [id number]         Weight:       268.0 lb Date of Birth:  Jul 30, 1955         BSA:          2.290 m Patient Age:    64 years           BP:           146/83 mmHg Patient Gender: M                  HR:           68 bpm. Exam Location:  [facility] Procedure: Color Doppler, Cardiac Doppler and Intracardiac Opacification Agent Indications:     R06.00 Dyspnea  History:         Patient has no prior history of Echocardiogram examinations.                  COPD; Risk Factors:Hypertension.  Sonographer:     Humphrey Rolls RDCS (AE) Referring Phys:  [id number] AMY N COX Diagnosing Phys: Arnoldo Hooker MD  Sonographer Comments: Technically difficult study due to poor echo windows. Image acquisition challenging due to patient body habitus and Image acquisition challenging due to COPD. IMPRESSIONS  1. Left ventricular ejection fraction, by estimation, is 65 to 70%. The left ventricle has normal function. The left ventricle has no regional wall motion abnormalities. Left ventricular diastolic parameters were normal.  2. Right ventricular systolic function is normal. The right ventricular size is normal.  3. The mitral valve is normal in structure. Trivial mitral valve regurgitation.  4. The aortic valve is normal in structure. Aortic valve regurgitation is not visualized. FINDINGS  Left Ventricle: Left ventricular ejection fraction, by estimation, is 65 to 70%. The left ventricle has normal function.  The left ventricle has no regional wall motion abnormalities. Definity contrast agent was given IV to delineate the left ventricular  endocardial borders. The left ventricular internal cavity size was small. There is no left ventricular hypertrophy. Left ventricular diastolic parameters were normal. Right Ventricle: The right ventricular size is normal. No increase in right ventricular wall thickness. Right ventricular systolic function is normal. Left Atrium: Left atrial size was normal in size. Right Atrium: Right atrial size was normal in size. Pericardium: There is no evidence of pericardial effusion. Mitral Valve: The mitral valve is normal in structure. Trivial mitral valve regurgitation. MV peak gradient, 2.1 mmHg. The mean mitral valve gradient is 1.0 mmHg. Tricuspid Valve: The tricuspid valve is normal in structure. Tricuspid valve regurgitation is trivial. Aortic Valve: The aortic valve is normal in structure. Aortic valve regurgitation  is not visualized. Aortic valve mean gradient measures 4.0 mmHg. Aortic valve peak gradient measures 8.6 mmHg. Aortic valve area, by VTI measures 2.36 cm. Pulmonic Valve: The pulmonic valve was normal in structure. Pulmonic valve regurgitation is not visualized. Aorta: The aortic root and ascending aorta are structurally normal, with no evidence of dilitation. IAS/Shunts: No atrial level shunt detected by color flow Doppler.  LEFT VENTRICLE PLAX 2D LVIDd:         4.80 cm  Diastology LVIDs:         3.50 cm  LV e' medial:    7.62 cm/s LV PW:         1.10 cm  LV E/e' medial:  6.3 LV IVS:        1.00 cm  LV e' lateral:   7.83 cm/s LVOT diam:     2.20 cm  LV E/e' lateral: 6.1 LV SV:         56 LV SV Index:   24 LVOT Area:     3.80 cm  LEFT ATRIUM           Index LA diam:      2.60 cm 1.14 cm/m LA Vol (A4C): 31.8 ml 13.89 ml/m  AORTIC VALVE                   PULMONIC VALVE AV Area (Vmax):    2.13 cm    PV Vmax:       1.03 m/s AV Area (Vmean):   2.27 cm    PV Vmean:       67.000 cm/s AV Area (VTI):     2.36 cm    PV VTI:        0.177 m AV Vmax:           147.00 cm/s PV Peak grad:  4.2 mmHg AV Vmean:          95.000 cm/s PV Mean grad:  2.0 mmHg AV VTI:            0.237 m AV Peak Grad:      8.6 mmHg AV Mean Grad:      4.0 mmHg LVOT Vmax:         82.20 cm/s LVOT Vmean:        56.800 cm/s LVOT VTI:          0.147 m LVOT/AV VTI ratio: 0.62  AORTA Ao Root diam: 3.30 cm MITRAL VALVE MV Area (PHT): 3.65 cm    SHUNTS MV Area VTI:   2.72 cm    Systemic VTI:  0.15 m MV Peak grad:  2.1 mmHg    Systemic Diam: 2.20 cm MV Mean grad:  1.0 mmHg MV Vmax:       0.72 m/s MV Vmean:      46.2 cm/s MV Decel Time: 208 msec MV E velocity: 48.00 cm/s MV A velocity: 59.10 cm/s MV E/A ratio:  0.81 Arnoldo Hooker MD Electronically signed by Arnoldo Hooker MD Signature Date/Time: 06/01/2020/5:10:25 PM    Final    [place] ASCITES (ABDOMEN LIMITED)  Result Date: 06/02/2020 CLINICAL DATA:  Abdominal distension, paracentesis the previous day EXAM: LIMITED ABDOMEN ULTRASOUND FOR ASCITES TECHNIQUE: Limited ultrasound survey for ascites was performed in all four abdominal quadrants. COMPARISON:  06/01/2020 FINDINGS: Small volume scattered residual/recurrent abdominal ascites. No significant pocket for safe paracentesis. IMPRESSION: Small volume residual/recurrent abdominal ascites. Paracentesis deferred. Electronically Signed   By: Corlis Leak M.D.   On: 06/02/2020 15:43  Subjective: Able to eat small volumes frequently with tolerable nausea and early satiety. No vomiting. Swelling has improved, though remains.   Discharge Exam: Vitals:   06/05/20 0753 06/05/20 1017  BP: 111/77 108/77  Pulse: 65 67  Resp: 15   Temp: 98.6 F (37 C)   SpO2: 97%    General: Pt is alert, awake, not in acute distress Cardiovascular: RRR, S1/S2 +, no rubs, no gallops Respiratory: Nonlabored and clear Abdominal: Protuberant without fluid wave or pain. Extremities: 1+ improved edema in legs with chronic venous stasis  changes  Labs: BNP (last 3 results) Recent Labs    06/02/20 0331 06/03/20 0424  BNP 48.7 50.4   Basic Metabolic Panel: Recent Labs  Lab 06/01/20 0545 06/02/20 0331 06/03/20 0424 06/04/20 0416 06/05/20 0500  NA 137 140 136 137 136  K 3.6 4.5 3.4* 3.3* 4.3  CL 88* 89* 86* 85* 84*  CO2 37* 41* 38* 40* 39*  GLUCOSE 113* 147* 119* 108* 110*  BUN 12 9 9 13 13   CREATININE 0.71 0.83 0.80 0.93 0.93  CALCIUM 8.7* 9.2 8.4* 8.5* 8.6*  MG  --   --   --  1.8  --    Liver Function Tests: Recent Labs  Lab 05/31/20 1520 06/03/20 0424 06/04/20 0416 06/05/20 0500  AST 39 30 26 31   ALT 61* 52* 46* 48*  ALKPHOS 66 58 62 63  BILITOT 1.5* 0.7 0.9 1.1  PROT 7.2 6.0* 6.3* 6.5  ALBUMIN 3.6 2.9* 3.0* 3.0*   Recent Labs  Lab 05/31/20 1520  LIPASE 24   No results for input(s): AMMONIA in the last 168 hours. CBC: Recent Labs  Lab 05/31/20 1520 06/01/20 0545 06/03/20 0424  WBC 9.8 8.6 9.9  NEUTROABS  --  7.2  --   HGB 14.5 13.4 14.2  HCT 44.5 42.4 45.6  MCV 88.8 90.8 91.6  PLT 270 237 235   Cardiac Enzymes: No results for input(s): CKTOTAL, CKMB, CKMBINDEX, TROPONINI in the last 168 hours. BNP: Invalid input(s): POCBNP CBG: No results for input(s): GLUCAP in the last 168 hours. D-Dimer No results for input(s): DDIMER in the last 72 hours. Hgb A1c Recent Labs    06/04/20 0416  HGBA1C 5.4   Lipid Profile No results for input(s): CHOL, HDL, LDLCALC, TRIG, CHOLHDL, LDLDIRECT in the last 72 hours. Thyroid function studies No results for input(s): TSH, T4TOTAL, T3FREE, THYROIDAB in the last 72 hours.  Invalid input(s): FREET3 Anemia work up No results for input(s): VITAMINB12, FOLATE, FERRITIN, TIBC, IRON, RETICCTPCT in the last 72 hours. Urinalysis    Component Value Date/Time   COLORURINE YELLOW (A) 05/31/2020 2039   APPEARANCEUR CLEAR (A) 05/31/2020 2039   LABSPEC >1.046 (H) 05/31/2020 2039   PHURINE 6.0 05/31/2020 2039   GLUCOSEU NEGATIVE 05/31/2020 2039    HGBUR NEGATIVE 05/31/2020 2039   BILIRUBINUR NEGATIVE 05/31/2020 2039   KETONESUR 80 (A) 05/31/2020 2039   PROTEINUR NEGATIVE 05/31/2020 2039   NITRITE NEGATIVE 05/31/2020 2039   LEUKOCYTESUR NEGATIVE 05/31/2020 2039    Microbiology Recent Results (from the past 240 hour(s))  SARS CORONAVIRUS 2 (TAT 6-24 HRS) Nasopharyngeal Nasopharyngeal Swab     Status: None   Collection Time: 05/31/20  6:21 PM   Specimen: Nasopharyngeal Swab  Result Value Ref Range Status   SARS Coronavirus 2 NEGATIVE NEGATIVE Final    Comment: (NOTE) SARS-CoV-2 target nucleic acids are NOT DETECTED.  The SARS-CoV-2 RNA is generally detectable in upper and lower respiratory specimens during the acute phase of  infection. Negative results do not preclude SARS-CoV-2 infection, do not rule out co-infections with other pathogens, and should not be used as the sole basis for treatment or other patient management decisions. Negative results must be combined with clinical observations, patient history, and epidemiological information. The expected result is Negative.  Fact Sheet for Patients: HairSlick.no  Fact Sheet for Healthcare Providers: quierodirigir.com  This test is not yet approved or cleared by the [place] FDA and  has been authorized for detection and/or diagnosis of SARS-CoV-2 by FDA under an Emergency Use Authorization (EUA). This EUA will remain  in effect (meaning this test can be used) for the duration of the COVID-19 declaration under Se ction 564(b)(1) of the Act, 21 U.S.C. section 360bbb-3(b)(1), unless the authorization is terminated or revoked sooner.  Performed at [facility], [address], [place], [place] [address]   Body fluid culture w Gram Stain     Status: None   Collection Time: 06/01/20 10:21 AM   Specimen: PATH Cytology Peritoneal fluid  Result Value Ref Range Status   Specimen Description   Final     PERITONEAL Performed at [facility], [address] [place], [place] [address]    Special Requests   Final    PERITONEAL Performed at [facility], [address] [place], [place] [address]    Gram Stain   Final    RARE WBC PRESENT, PREDOMINANTLY MONONUCLEAR NO ORGANISMS SEEN    Culture   Final    NO GROWTH 3 DAYS Performed at [facility], [address], [place], [place] [address]    Report Status 06/05/2020 FINAL  Final    Time coordinating discharge: Approximately 40 minutes  Tyrone Nine, MD  [facility] 06/05/2020, 11:12 AM

## 2020-06-05 NOTE — Progress Notes (Signed)
Patient presents for  Therapeutic paracentesis. [place] limited abdomen shows trace amount of peritoneal fluid noted  Insufficient to perform a safe paracentesis. Procedure not performed.

## 2020-06-05 NOTE — Plan of Care (Signed)
End of Shift Summary:  Alert and oriented x4. VSS. Remained on 2L via [place] pt's home dose. Pt denies pain or n/v this shift. UOP adequate, ambulated up to bathroom with standby assist. Pt's sister would like to be present for any discharge teaching and she would like to speak with physician prior to discharge as well per previous RN. Pt remained free from falls or injury. Bed low and in locked position. Call bell within reach and able to use.   Problem: Education: Goal: Knowledge of General Education information will improve Description: Including pain rating scale, medication(s)/side effects and non-pharmacologic comfort measures Outcome: Progressing   Problem: Health Behavior/Discharge Planning: Goal: Ability to manage health-related needs will improve Outcome: Progressing   Problem: Clinical Measurements: Goal: Ability to maintain clinical measurements within normal limits will improve Outcome: Progressing Goal: Will remain free from infection Outcome: Progressing Goal: Diagnostic test results will improve Outcome: Progressing Goal: Respiratory complications will improve Outcome: Progressing Goal: Cardiovascular complication will be avoided Outcome: Progressing   Problem: Activity: Goal: Risk for activity intolerance will decrease Outcome: Progressing   Problem: Nutrition: Goal: Adequate nutrition will be maintained Outcome: Progressing   Problem: Coping: Goal: Level of anxiety will decrease Outcome: Progressing   Problem: Elimination: Goal: Will not experience complications related to bowel motility Outcome: Progressing Goal: Will not experience complications related to urinary retention Outcome: Progressing   Problem: Pain Managment: Goal: General experience of comfort will improve Outcome: Progressing   Problem: Safety: Goal: Ability to remain free from injury will improve Outcome: Progressing   Problem: Skin Integrity: Goal: Risk for impaired skin integrity  will decrease Outcome: Progressing

## 2020-07-15 ENCOUNTER — Ambulatory Visit: Payer: Medicare Other | Admitting: Gastroenterology

## 2020-07-17 ENCOUNTER — Emergency Department: Payer: Medicare Other

## 2020-07-17 ENCOUNTER — Other Ambulatory Visit: Payer: Self-pay

## 2020-07-17 ENCOUNTER — Encounter: Payer: Self-pay | Admitting: Emergency Medicine

## 2020-07-17 ENCOUNTER — Inpatient Hospital Stay: Payer: Medicare Other

## 2020-07-17 ENCOUNTER — Inpatient Hospital Stay
Admission: EM | Admit: 2020-07-17 | Discharge: 2020-07-22 | DRG: 372 | Disposition: A | Discharge status: Hospice | Payer: Medicare Other | Attending: Internal Medicine | Admitting: Internal Medicine

## 2020-07-17 DIAGNOSIS — J9610 Chronic respiratory failure, unspecified whether with hypoxia or hypercapnia: Secondary | ICD-10-CM | POA: Diagnosis not present

## 2020-07-17 DIAGNOSIS — Z882 Allergy status to sulfonamides status: Secondary | ICD-10-CM

## 2020-07-17 DIAGNOSIS — I959 Hypotension, unspecified: Secondary | ICD-10-CM | POA: Diagnosis not present

## 2020-07-17 DIAGNOSIS — J9811 Atelectasis: Secondary | ICD-10-CM | POA: Diagnosis present

## 2020-07-17 DIAGNOSIS — Z515 Encounter for palliative care: Secondary | ICD-10-CM

## 2020-07-17 DIAGNOSIS — Z7189 Other specified counseling: Secondary | ICD-10-CM | POA: Diagnosis not present

## 2020-07-17 DIAGNOSIS — R778 Other specified abnormalities of plasma proteins: Secondary | ICD-10-CM

## 2020-07-17 DIAGNOSIS — I10 Essential (primary) hypertension: Secondary | ICD-10-CM | POA: Diagnosis present

## 2020-07-17 DIAGNOSIS — K7460 Unspecified cirrhosis of liver: Secondary | ICD-10-CM | POA: Diagnosis present

## 2020-07-17 DIAGNOSIS — Z87891 Personal history of nicotine dependence: Secondary | ICD-10-CM

## 2020-07-17 DIAGNOSIS — K5900 Constipation, unspecified: Secondary | ICD-10-CM | POA: Diagnosis present

## 2020-07-17 DIAGNOSIS — I739 Peripheral vascular disease, unspecified: Secondary | ICD-10-CM | POA: Diagnosis present

## 2020-07-17 DIAGNOSIS — N179 Acute kidney failure, unspecified: Secondary | ICD-10-CM | POA: Diagnosis present

## 2020-07-17 DIAGNOSIS — Z9981 Dependence on supplemental oxygen: Secondary | ICD-10-CM

## 2020-07-17 DIAGNOSIS — Z6841 Body Mass Index (BMI) 40.0 and over, adult: Secondary | ICD-10-CM

## 2020-07-17 DIAGNOSIS — Z79899 Other long term (current) drug therapy: Secondary | ICD-10-CM

## 2020-07-17 DIAGNOSIS — J90 Pleural effusion, not elsewhere classified: Secondary | ICD-10-CM | POA: Diagnosis present

## 2020-07-17 DIAGNOSIS — G4700 Insomnia, unspecified: Secondary | ICD-10-CM | POA: Diagnosis present

## 2020-07-17 DIAGNOSIS — Z8249 Family history of ischemic heart disease and other diseases of the circulatory system: Secondary | ICD-10-CM

## 2020-07-17 DIAGNOSIS — J9611 Chronic respiratory failure with hypoxia: Secondary | ICD-10-CM | POA: Diagnosis present

## 2020-07-17 DIAGNOSIS — R161 Splenomegaly, not elsewhere classified: Secondary | ICD-10-CM | POA: Diagnosis present

## 2020-07-17 DIAGNOSIS — G4733 Obstructive sleep apnea (adult) (pediatric): Secondary | ICD-10-CM | POA: Diagnosis not present

## 2020-07-17 DIAGNOSIS — K420 Umbilical hernia with obstruction, without gangrene: Secondary | ICD-10-CM | POA: Diagnosis present

## 2020-07-17 DIAGNOSIS — R7401 Elevation of levels of liver transaminase levels: Secondary | ICD-10-CM | POA: Diagnosis not present

## 2020-07-17 DIAGNOSIS — I700 Atherosclerosis of aorta: Secondary | ICD-10-CM | POA: Diagnosis present

## 2020-07-17 DIAGNOSIS — I714 Abdominal aortic aneurysm, without rupture: Secondary | ICD-10-CM | POA: Diagnosis present

## 2020-07-17 DIAGNOSIS — R1084 Generalized abdominal pain: Secondary | ICD-10-CM

## 2020-07-17 DIAGNOSIS — E66813 Obesity, class 3: Secondary | ICD-10-CM | POA: Diagnosis present

## 2020-07-17 DIAGNOSIS — R188 Other ascites: Secondary | ICD-10-CM | POA: Diagnosis present

## 2020-07-17 DIAGNOSIS — Z66 Do not resuscitate: Secondary | ICD-10-CM | POA: Diagnosis present

## 2020-07-17 DIAGNOSIS — Z20822 Contact with and (suspected) exposure to covid-19: Secondary | ICD-10-CM | POA: Diagnosis present

## 2020-07-17 DIAGNOSIS — E871 Hypo-osmolality and hyponatremia: Secondary | ICD-10-CM | POA: Diagnosis present

## 2020-07-17 DIAGNOSIS — K766 Portal hypertension: Secondary | ICD-10-CM | POA: Diagnosis present

## 2020-07-17 DIAGNOSIS — J449 Chronic obstructive pulmonary disease, unspecified: Secondary | ICD-10-CM | POA: Diagnosis present

## 2020-07-17 DIAGNOSIS — K652 Spontaneous bacterial peritonitis: Principal | ICD-10-CM | POA: Diagnosis present

## 2020-07-17 DIAGNOSIS — R630 Anorexia: Secondary | ICD-10-CM | POA: Diagnosis present

## 2020-07-17 DIAGNOSIS — K56609 Unspecified intestinal obstruction, unspecified as to partial versus complete obstruction: Secondary | ICD-10-CM | POA: Diagnosis present

## 2020-07-17 DIAGNOSIS — F419 Anxiety disorder, unspecified: Secondary | ICD-10-CM | POA: Diagnosis present

## 2020-07-17 DIAGNOSIS — E860 Dehydration: Secondary | ICD-10-CM | POA: Diagnosis present

## 2020-07-17 DIAGNOSIS — I7409 Other arterial embolism and thrombosis of abdominal aorta: Secondary | ICD-10-CM | POA: Diagnosis not present

## 2020-07-17 DIAGNOSIS — K429 Umbilical hernia without obstruction or gangrene: Secondary | ICD-10-CM | POA: Diagnosis not present

## 2020-07-17 DIAGNOSIS — K7031 Alcoholic cirrhosis of liver with ascites: Secondary | ICD-10-CM | POA: Diagnosis not present

## 2020-07-17 DIAGNOSIS — R109 Unspecified abdominal pain: Secondary | ICD-10-CM

## 2020-07-17 DIAGNOSIS — T502X5A Adverse effect of carbonic-anhydrase inhibitors, benzothiadiazides and other diuretics, initial encounter: Secondary | ICD-10-CM | POA: Diagnosis present

## 2020-07-17 LAB — CBC WITH DIFFERENTIAL/PLATELET
Abs Immature Granulocytes: 0.15 10*3/uL — ABNORMAL HIGH (ref 0.00–0.07)
Basophils Absolute: 0 10*3/uL (ref 0.0–0.1)
Basophils Relative: 0 %
Eosinophils Absolute: 0 10*3/uL (ref 0.0–0.5)
Eosinophils Relative: 0 %
HCT: 44 % (ref 39.0–52.0)
Hemoglobin: 15 g/dL (ref 13.0–17.0)
Immature Granulocytes: 1 %
Lymphocytes Relative: 4 %
Lymphs Abs: 0.6 10*3/uL — ABNORMAL LOW (ref 0.7–4.0)
MCH: 28.6 pg (ref 26.0–34.0)
MCHC: 34.1 g/dL (ref 30.0–36.0)
MCV: 83.8 fL (ref 80.0–100.0)
Monocytes Absolute: 1 10*3/uL (ref 0.1–1.0)
Monocytes Relative: 7 %
Neutro Abs: 13.2 10*3/uL — ABNORMAL HIGH (ref 1.7–7.7)
Neutrophils Relative %: 88 %
Platelets: 454 10*3/uL — ABNORMAL HIGH (ref 150–400)
RBC: 5.25 MIL/uL (ref 4.22–5.81)
RDW: 14.5 % (ref 11.5–15.5)
WBC: 15 10*3/uL — ABNORMAL HIGH (ref 4.0–10.5)
nRBC: 0 % (ref 0.0–0.2)

## 2020-07-17 LAB — TROPONIN I (HIGH SENSITIVITY)
Troponin I (High Sensitivity): 32 ng/L — ABNORMAL HIGH (ref <18)
Troponin I (High Sensitivity): 28 ng/L — ABNORMAL HIGH (ref <18)

## 2020-07-17 LAB — COMPREHENSIVE METABOLIC PANEL
ALT: 259 U/L — ABNORMAL HIGH (ref 0–44)
AST: 111 U/L — ABNORMAL HIGH (ref 15–41)
Albumin: 3.2 g/dL — ABNORMAL LOW (ref 3.5–5.0)
Alkaline Phosphatase: 111 U/L (ref 38–126)
Anion gap: 18 — ABNORMAL HIGH (ref 5–15)
BUN: 47 mg/dL — ABNORMAL HIGH (ref 8–23)
CO2: 32 mmol/L (ref 22–32)
Calcium: 9.2 mg/dL (ref 8.9–10.3)
Chloride: 77 mmol/L — ABNORMAL LOW (ref 98–111)
Creatinine, Ser: 1.32 mg/dL — ABNORMAL HIGH (ref 0.61–1.24)
GFR, Estimated: 60 mL/min — ABNORMAL LOW (ref >60)
Glucose, Bld: 143 mg/dL — ABNORMAL HIGH (ref 70–99)
Potassium: 3.5 mmol/L (ref 3.5–5.1)
Sodium: 127 mmol/L — ABNORMAL LOW (ref 135–145)
Total Bilirubin: 2.6 mg/dL — ABNORMAL HIGH (ref 0.3–1.2)
Total Protein: 7.3 g/dL (ref 6.5–8.1)

## 2020-07-17 LAB — AMMONIA: Ammonia: 11 umol/L (ref 9–35)

## 2020-07-17 LAB — RESP PANEL BY RT-PCR (FLU A&B, COVID) ARPGX2
Influenza A by PCR: NEGATIVE
Influenza B by PCR: NEGATIVE
SARS Coronavirus 2 by RT PCR: NEGATIVE

## 2020-07-17 LAB — LIPASE, BLOOD: Lipase: 24 U/L (ref 11–51)

## 2020-07-17 LAB — GLUCOSE, PLEURAL OR PERITONEAL FLUID: Glucose, Fluid: 78 mg/dL

## 2020-07-17 LAB — MAGNESIUM: Magnesium: 2.4 mg/dL (ref 1.7–2.4)

## 2020-07-17 LAB — APTT: aPTT: 32 seconds (ref 24–36)

## 2020-07-17 LAB — BODY FLUID CELL COUNT WITH DIFFERENTIAL
Eos, Fluid: 0 %
Lymphs, Fluid: 34 %
Monocyte-Macrophage-Serous Fluid: 58 %
Neutrophil Count, Fluid: 7 %
Total Nucleated Cell Count, Fluid: 3060 cu mm

## 2020-07-17 LAB — ALBUMIN, PLEURAL OR PERITONEAL FLUID: Albumin, Fluid: 2.6 g/dL

## 2020-07-17 LAB — LACTATE DEHYDROGENASE, PLEURAL OR PERITONEAL FLUID: LD, Fluid: 317 U/L — ABNORMAL HIGH (ref 3–23)

## 2020-07-17 LAB — LACTIC ACID, PLASMA: Lactic Acid, Venous: 1.4 mmol/L (ref 0.5–1.9)

## 2020-07-17 LAB — PROTIME-INR
INR: 1.1 (ref 0.8–1.2)
Prothrombin Time: 14.4 seconds (ref 11.4–15.2)

## 2020-07-17 MED ORDER — ONDANSETRON HCL 4 MG/2ML IJ SOLN
4.0000 mg | Freq: Four times a day (QID) | INTRAMUSCULAR | Status: DC | PRN
  Administered 2020-07-18 – 2020-07-20 (×3): 4 mg via INTRAVENOUS
  Filled 2020-07-17 (×3): qty 2

## 2020-07-17 MED ORDER — ONDANSETRON HCL 4 MG PO TABS: 4.0000 mg | ORAL_TABLET | Freq: Four times a day (QID) | ORAL | Status: DC | PRN

## 2020-07-17 MED ORDER — ALBUTEROL SULFATE (2.5 MG/3ML) 0.083% IN NEBU
3.0000 mL | INHALATION_SOLUTION | Freq: Four times a day (QID) | RESPIRATORY_TRACT | Status: DC | PRN
  Administered 2020-07-17 – 2020-07-19 (×4): 3 mL via RESPIRATORY_TRACT
  Filled 2020-07-17 (×4): qty 3

## 2020-07-17 MED ORDER — SODIUM CHLORIDE 0.9 % IV SOLN
250.0000 mL | INTRAVENOUS | Status: DC | PRN
  Administered 2020-07-20: 250 mL via INTRAVENOUS

## 2020-07-17 MED ORDER — ATENOLOL 25 MG PO TABS
25.0000 mg | ORAL_TABLET | Freq: Two times a day (BID) | ORAL | Status: DC
  Administered 2020-07-17 – 2020-07-19 (×4): 25 mg via ORAL
  Filled 2020-07-17 (×5): qty 1

## 2020-07-17 MED ORDER — LACTULOSE 10 GM/15ML PO SOLN
10.0000 g | Freq: Two times a day (BID) | ORAL | Status: DC
  Administered 2020-07-17 – 2020-07-22 (×10): 10 g via ORAL
  Filled 2020-07-17 (×10): qty 30

## 2020-07-17 MED ORDER — TIOTROPIUM BROMIDE MONOHYDRATE 18 MCG IN CAPS
1.0000 | ORAL_CAPSULE | Freq: Every day | RESPIRATORY_TRACT | Status: DC
  Administered 2020-07-18 – 2020-07-21 (×4): 18 ug via RESPIRATORY_TRACT
  Filled 2020-07-17 (×2): qty 5

## 2020-07-17 MED ORDER — LACTATED RINGERS IV BOLUS
1000.0000 mL | Freq: Once | INTRAVENOUS | Status: AC
  Administered 2020-07-17: 1000 mL via INTRAVENOUS

## 2020-07-17 MED ORDER — PIPERACILLIN-TAZOBACTAM 3.375 G IVPB 30 MIN
3.3750 g | Freq: Once | INTRAVENOUS | Status: AC
  Administered 2020-07-17: 3.375 g via INTRAVENOUS
  Filled 2020-07-17: qty 50

## 2020-07-17 MED ORDER — SODIUM CHLORIDE 0.9% FLUSH: 3.0000 mL | INTRAVENOUS | Status: DC | PRN

## 2020-07-17 MED ORDER — SODIUM CHLORIDE 0.9% FLUSH
3.0000 mL | Freq: Two times a day (BID) | INTRAVENOUS | Status: DC
  Administered 2020-07-17 – 2020-07-22 (×7): 3 mL via INTRAVENOUS

## 2020-07-17 NOTE — H&P (Addendum)
History and Physical    Seth Haley KZS:[id number] DOB: 1955-04-29 DOA: 07/17/2020  PCP: Simonne Martinet, NP   Patient coming from: Home  I have personally briefly reviewed patient's old medical records in [facility]  Chief Complaint: Abdominal pain  HPI: Tylin Force is a 65 y.o. 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  Patient was recently hospitalized about a month ago and had paracentesis done at the time. He complains of worsening shortness of breath with exertion, anorexia, nausea, poor oral intake as well as constipation.  His sister who was at the bedside states that he has become increasingly weak and that his blood pressure is usually on the low side at home and is concerned about the medications he is on. He denies having any chest pain, no leg swelling, no orthopnea, no PND, no fever, no chills, no urinary symptoms, no sore throat, no blurred vision, no headache, no focal deficits, no dizziness or lightheadedness. Labs show sodium 127, potassium 3.5, chloride 77, bicarb 32, glucose 143, BUN 47, creatinine 1.32, calcium 9.2, magnesium 2.4, alkaline phosphatase 111, albumin 3.2, lipase 24, AST 111, ALT 259, total protein 7.3, ammonia 11, total bilirubin 2.6, troponin 32 >> 28, white count 15.0, hemoglobin 15.0, hematocrit 44, MCV 83.8, RDW 14.5, platelet count 454, PT 14.4, INR 1.1 Respiratory viral panel is negative Chest x-ray reviewed by me shows no acute cardiopulmonary disease CT scan of abdomen and pelvis shows large volume ascites. Subtle surface nodularity of the liver suggesting underlying cirrhosis.Small right pleural effusion with associated  compressive atelectasis. Cholelithiasis without evidence to suggest cholecystitis. Colonic diverticulosis without evidence of acute diverticulitis. Paraumbilical hernia containing small bowel without evidence of obstruction. 5.7 cm infrarenal abdominal aortic aneurysm, previously 5.1 cm. Recommend referral to a vascular specialist. Aortic atherosclerosis  Twelve-lead EKG reviewed by me shows sinus rhythm with low voltage QRS      ED Course: Patient is a 65 year old male with a history of liver cirrhosis with portal hypertension and splenomegaly, history of COPD with chronic respiratory failure on 2 L of oxygen who presents to the emergency room for evaluation of worsening shortness of breath as well as abdominal distention and pain. He has massive ascites as well as lower abdominal pain and will need paracentesis. He will be admitted to the hospital for further evaluation.  Review of Systems: As per HPI otherwise all other systems reviewed and negative.  Good   Past Medical History:  Diagnosis Date   COPD (chronic obstructive pulmonary disease) (HCC)    Hypertension    Umbilical hernia     History reviewed. No pertinent surgical history.   reports that he has quit smoking. He has never used smokeless tobacco. He reports previous alcohol use. He reports previous drug use.  Allergies  Allergen Reactions   Sulfa Antibiotics Other (See Comments)    Family History  Problem Relation Age of Onset   Hypertension Mother      Prior to Admission medications   Medication Sig Start Date End Date Taking? Authorizing Provider  albuterol (PROVENTIL) (2.5 MG/3ML) 0.083% nebulizer solution Inhale 3 mLs into the lungs every 6 (six) hours as needed. 10/02/17   [provider]  atenolol (TENORMIN) 25 MG tablet Take  25 mg by mouth 2 (two) times daily. 03/23/20   [provider]  furosemide (LASIX) 20 MG tablet Take 3 tablets (60 mg total) by mouth daily. 06/06/20   Tyrone NineGrunz, Ryan B, MD   lactulose (CHRONULAC) 10 GM/15ML solution Take 15 mLs (10 g total) by mouth 2 (two) times daily. 06/05/20   Tyrone NineGrunz, Ryan B, MD  ondansetron (ZOFRAN-ODT) 4 MG disintegrating tablet Take 1 tablet (4 mg total) by mouth every 8 (eight) hours as needed for nausea or vomiting. 06/05/20   Tyrone NineGrunz, Ryan B, MD  PROAIR HFA 108 [phone](90 Base) MCG/ACT inhaler Inhale 2 puffs into the lungs every 6 (six) hours as needed for wheezing. 03/26/20   [provider]  prochlorperazine (COMPAZINE) 5 MG tablet Take 1 tablet (5 mg total) by mouth every 6 (six) hours as needed for refractory nausea / vomiting. 06/05/20   Tyrone NineGrunz, Ryan B, MD  SPIRIVA HANDIHALER 18 MCG inhalation capsule Place 1 capsule into inhaler and inhale daily. 03/25/20   [provider]  spironolactone (ALDACTONE) 50 MG tablet Take 3 tablets (150 mg total) by mouth daily. 06/05/20   Tyrone NineGrunz, Ryan B, MD    Physical Exam: Vitals:   07/17/20 1015 07/17/20 1200 07/17/20 1330 07/17/20 1345  BP: 102/81 113/86 118/88 (!) 134/99  Pulse: 89 87  83  Resp: (!) 28 (!) 38 (!) 30 20  Temp:      SpO2: 97% 99%  99%  Weight:      Height:         Vitals:   07/17/20 1015 07/17/20 1200 07/17/20 1330 07/17/20 1345  BP: 102/81 113/86 118/88 (!) 134/99  Pulse: 89 87  83  Resp: (!) 28 (!) 38 (!) 30 20  Temp:      SpO2: 97% 99%  99%  Weight:      Height:          Constitutional: Alert and oriented x 3 . Not in any apparent distress.  Chronically ill-appearing HEENT:      Head: Normocephalic and atraumatic.         Eyes: PERLA, EOMI, Conjunctivae are normal. Sclera is non-icteric.       Mouth/Throat: Mucous membranes are moist.       Neck: Supple with no signs of meningismus. Cardiovascular: Regular rate and rhythm. No murmurs, gallops, or rubs. 2+ symmetrical distal pulses are present . No JVD. No LE edema Respiratory: Tachypnea. crackles at the lung bases bilaterally.  Scattered wheezes, no crackles, or rhonchi.  Gastrointestinal: Soft, tender lower  quadrants , distended with positive bowel sounds.  Firm Genitourinary: No CVA tenderness. Musculoskeletal: Nontender with normal range of motion in all extremities. No cyanosis, or erythema of extremities. Neurologic:  Face is symmetric. Moving all extremities. No gross focal neurologic deficits . Skin: Skin is warm, dry.  No rash or ulcers Psychiatric: Depressed mood and flat affect    Labs on Admission: I have personally reviewed following labs and imaging studies  CBC: Recent Labs  Lab 07/17/20 1004  WBC 15.0*  NEUTROABS 13.2*  HGB 15.0  HCT 44.0  MCV 83.8  PLT 454*   Basic Metabolic Panel: Recent Labs  Lab 07/17/20 1004  NA 127*  K 3.5  CL 77*  CO2 32  GLUCOSE 143*  BUN 47*  CREATININE 1.32*  CALCIUM 9.2  MG 2.4   GFR: Estimated Creatinine Clearance: 70.3 mL/min (A) (by C-G formula based on SCr of 1.32 mg/dL (H)). Liver Function Tests: Recent Labs  Lab 07/17/20 1004  AST 111*  ALT 259*  ALKPHOS 111  BILITOT 2.6*  PROT 7.3  ALBUMIN 3.2*   Recent Labs  Lab 07/17/20 1004  LIPASE 24   Recent Labs  Lab 07/17/20 1142  AMMONIA 11   Coagulation Profile: Recent Labs  Lab 07/17/20 1004  INR 1.1   Cardiac Enzymes: No results for input(s): CKTOTAL, CKMB, CKMBINDEX, TROPONINI in the last 168 hours. BNP (last 3 results) No results for input(s): PROBNP in the last 8760 hours. HbA1C: No results for input(s): HGBA1C in the last 72 hours. CBG: No results for input(s): GLUCAP in the last 168 hours. Lipid Profile: No results for input(s): CHOL, HDL, LDLCALC, TRIG, CHOLHDL, LDLDIRECT in the last 72 hours. Thyroid Function Tests: No results for input(s): TSH, T4TOTAL, FREET4, T3FREE, THYROIDAB in the last 72 hours. Anemia Panel: No results for input(s): VITAMINB12, FOLATE, FERRITIN, TIBC, IRON, RETICCTPCT in the last 72 hours. Urine analysis:    Component Value Date/Time   COLORURINE YELLOW (A) 05/31/2020 2039   APPEARANCEUR CLEAR (A) 05/31/2020 2039    LABSPEC >1.046 (H) 05/31/2020 2039   PHURINE 6.0 05/31/2020 2039   GLUCOSEU NEGATIVE 05/31/2020 2039   HGBUR NEGATIVE 05/31/2020 2039   BILIRUBINUR NEGATIVE 05/31/2020 2039   KETONESUR 80 (A) 05/31/2020 2039   PROTEINUR NEGATIVE 05/31/2020 2039   NITRITE NEGATIVE 05/31/2020 2039   LEUKOCYTESUR NEGATIVE 05/31/2020 2039    Radiological Exams on Admission: CT ABDOMEN PELVIS WO CONTRAST  Result Date: 07/17/2020 CLINICAL DATA:  Abdominal distension, shortness of breath EXAM: CT ABDOMEN AND PELVIS WITHOUT CONTRAST TECHNIQUE: Multidetector CT imaging of the abdomen and pelvis was performed following the standard protocol without IV contrast. COMPARISON:  CT 05/31/2020 FINDINGS: Lower chest: Small right pleural effusion with associated compressive atelectasis. Left lung bases clear. Heart size is normal. Hepatobiliary: Subtle surface nodularity of the liver suggesting underlying cirrhosis. No focal liver lesion is identified on noncontrast study. Cholelithiasis without evidence to suggest cholecystitis. No biliary dilatation. Pancreas: Unremarkable. No pancreatic ductal dilatation or surrounding inflammatory changes. Spleen: Spleen is upper limits of normal in size. Adrenals/Urinary Tract: Unremarkable adrenal glands. Stable upper pole right renal cyst. Tiny punctate calcification within midpole of the left kidney which may represent a vascular calcification or nonobstructing stone. No hydronephrosis. Urinary bladder is decompressed, limiting its evaluation. Stomach/Bowel: Stomach is within normal limits. Appendix not definitively identified. Colonic diverticulosis. No evidence of bowel wall thickening, distention, or inflammatory changes. Moderate volume stool throughout the colon Vascular/Lymphatic: Aortoiliac atherosclerosis with infrarenal abdominal aortic aneurysm measuring 5.7 cm in maximal transverse dimension (series 2, image 51) previously 5.1 cm. No abdominopelvic lymphadenopathy. Reproductive:  Prostate is unremarkable. Other: Large volume ascites. No definite omental or peritoneal masses identified on noncontrast study. No pneumoperitoneum. Paraumbilical hernia containing small bowel without evidence of obstruction. Musculoskeletal: No acute or significant osseous findings. IMPRESSION: 1. Large volume ascites. 2. Subtle surface nodularity of the liver suggesting underlying cirrhosis. 3. Small right pleural effusion with associated compressive atelectasis. 4. Cholelithiasis without evidence to suggest cholecystitis. 5. Colonic diverticulosis without evidence of acute diverticulitis. 6. Paraumbilical hernia containing small bowel without evidence of obstruction. 7. 5.7 cm infrarenal abdominal aortic aneurysm, previously 5.1 cm. Recommend referral to a vascular specialist. This recommendation follows ACR consensus guidelines: White Paper of the ACR Incidental Findings Committee II on Vascular Findings. J Am Coll Radiol 2013; 10:789-794. 8. Aortic atherosclerosis (ICD10-I70.0). Electronically Signed   By: Duanne Guess D.O.   On: 07/17/2020 11:07   DG Chest Portable 1 View  Result Date: 07/17/2020  CLINICAL DATA:  Shortness of breath. EXAM: PORTABLE CHEST 1 VIEW COMPARISON:  06/01/2020 FINDINGS: The heart size and mediastinal contours are within normal limits. Similar chronic interstitial coarsening within the lower lung zones. Both lungs are clear. The visualized skeletal structures are unremarkable. IMPRESSION: No active cardiopulmonary abnormalities. Electronically Signed   By: Signa Kell M.D.   On: 07/17/2020 10:42   US ABDOMEN LIMITED RUQ (LIVER/GB)  Result Date: 07/17/2020 CLINICAL DATA:  Transaminitis. EXAM: ULTRASOUND ABDOMEN LIMITED RIGHT UPPER QUADRANT COMPARISON:  CT of same day. FINDINGS: Gallbladder: Cholelithiasis and sludge is noted. No gallbladder wall thickening is noted. No sonographic Murphy's sign is noted. Common bile duct: Diameter: 6 mm which is within normal limits.  Liver: No focal lesion identified. Increased echogenicity of hepatic parenchyma is noted with nodular contours suggesting hepatic cirrhosis. Portal vein is patent on color Doppler imaging with normal direction of blood flow towards the liver. Other: Moderate ascites is noted. IMPRESSION: Findings consistent with hepatic cirrhosis. No definite focal sonographic hepatic abnormality is noted. Cholelithiasis and sludge is noted without definite evidence of cholecystitis. Moderate ascites. Electronically Signed   By: Lupita Raider M.D.   On: 07/17/2020 12:39     Assessment/Plan Principal Problem:   SBP (spontaneous bacterial peritonitis) (HCC) Active Problems:   Essential hypertension   COPD (chronic obstructive pulmonary disease) (HCC)   Obesity, Class III, BMI 40-49.9 (morbid obesity) (HCC)   Cirrhosis of liver with ascites (HCC)   Chronic respiratory failure (HCC)   Dehydration with hyponatremia   Transaminitis    Spontaneous bacterial peritonitis Patient has a history of liver cirrhosis and presents to the ER for evaluation of abdominal distention associated with pain.  He denies having any fever or chills Will treat patient empirically for SBP with Rocephin 2 g IV daily IR consult for paracentesis Supportive care     COPD with chronic respiratory failure Not acutely exacerbated Worsening shortness of breath appears to be secondary to massive ascites compressing on the diaphragm. Continue as needed bronchodilator therapy Continue home oxygen at 2 L to maintain pulse oximetry greater than 92%     History of liver cirrhosis with portal hypertension and splenomegaly Maintain low-sodium diet Hold diuretics for now due to significant dehydration and electrolyte abnormalities Continue lactulose Continue low-dose beta-blocker    Dehydration with hyponatremia Secondary to poor oral intake and concomitant diuretic use Hold diuretics for now Encourage oral fluid intake Repeat  renal parameters in a.m.     Morbid obesity (BMI 45.19) Complicates overall prognosis. Lifestyle modification and exercise has been discussed with patient      Transaminitis Unclear etiology may be related to worsening liver cirrhosis Monitor liver enzymes   DVT prophylaxis: SCD  Code Status: full code  Family Communication: Greater than 50% of time was spent discussing patient's condition and plan of care with him and his sister at the bedside.  All questions and concerns have been addressed.  They verbalized understanding and agree with the plan. Disposition Plan: Back to previous home environment Consults called: Interventional radiology Status: At the time of admission, it appears that the appropriate admission status for this patient is inpatient. This is judged to be reasonable and necessary in order to provide the required intensity of service to ensure the patient's safety given the presenting symptoms, physical exam findings, and initial radiographic and laboratory data in the context of their comorbid conditions. Patient requires inpatient status due to high intensity of service, high risk for further deterioration and high frequency  of surveillance required.    Lucile Shutters MD [facility]     07/17/2020, 2:42 PM

## 2020-07-17 NOTE — Procedures (Signed)
Interventional Radiology Procedure Note  Procedure: Paracentesis   Indication: Ascites  Findings: Please refer to procedural dictation for full description.  Complications: None  EBL: < 10 mL  Alonia Dibuono, MD [phone]   

## 2020-07-17 NOTE — Consult Note (Signed)
[facility] Vascular Consult Note  MRN : [id number]  Seth Haley is a 65 y.o. ([birth year]) male who presents with chief complaint of  Chief Complaint  Patient presents with   Shortness of Breath  .  History of Present Illness:   I am asked to evaluate Seth Haley by Dr. Joylene Igo.  The patient is a 65 year old gentleman who presented to the emergency room earlier today with increasing shortness of breath as well as worsening abdominal pain.  He has a known history of refractory ascites and appears to be volume overloaded again.  Prior to my speaking with the patient he had a paracentesis which yielded 8 L.  He states his previous paracentesis was approximately 1 month ago.  I am asked to see the patient regarding his abdominal aortic aneurysm. The aneurysm was found incidentally by CT scan. Patient unusual back pain, no other abdominal complaints.  He notes since his paracentesis his abdominal pain has improved.  No history of an acute onset of painful blue discoloration of the toes.     No family history of AAA.   Patient denies amaurosis fugax or TIA symptoms. There is no history of claudication or rest pain symptoms of the lower extremities.  The patient denies angina or shortness of breath.  CT scan noncontrasted of the abdomen and pelvis obtained today to evaluate his ascites shows an AAA that measures 5. 7 cm    Current Facility-Administered Medications  Medication Dose Route Frequency Provider Last Rate Last Admin   0.9 %  sodium chloride infusion  250 mL Intravenous PRN Agbata, Tochukwu, MD       albuterol (PROVENTIL) (2.5 MG/3ML) 0.083% nebulizer solution 3 mL  3 mL Inhalation Q6H PRN Agbata, Tochukwu, MD       atenolol (TENORMIN) tablet 25 mg  25 mg Oral BID Agbata, Tochukwu, MD       lactulose (CHRONULAC) 10 GM/15ML solution 10 g  10 g Oral BID Agbata, Tochukwu, MD       ondansetron (ZOFRAN) tablet 4 mg  4 mg Oral Q6H PRN Agbata, Tochukwu, MD        Or   ondansetron (ZOFRAN) injection 4 mg  4 mg Intravenous Q6H PRN Agbata, Tochukwu, MD       sodium chloride flush (NS) 0.9 % injection 3 mL  3 mL Intravenous Q12H Agbata, Tochukwu, MD       sodium chloride flush (NS) 0.9 % injection 3 mL  3 mL Intravenous PRN Agbata, Tochukwu, MD       tiotropium (SPIRIVA) inhalation capsule ([facility] use ONLY) 18 mcg  1 capsule Inhalation Daily Agbata, Tochukwu, MD       Current Outpatient Medications  Medication Sig Dispense Refill   albuterol (PROVENTIL) (2.5 MG/3ML) 0.083% nebulizer solution Inhale 3 mLs into the lungs every 6 (six) hours as needed.     atenolol (TENORMIN) 25 MG tablet Take 25 mg by mouth 2 (two) times daily.     furosemide (LASIX) 20 MG tablet Take 3 tablets (60 mg total) by mouth daily. 90 tablet 1   SPIRIVA HANDIHALER 18 MCG inhalation capsule Place 1 capsule into inhaler and inhale daily.     spironolactone (ALDACTONE) 50 MG tablet Take 3 tablets (150 mg total) by mouth daily. 90 tablet 1   lactulose (CHRONULAC) 10 GM/15ML solution Take 15 mLs (10 g total) by mouth 2 (two) times daily. (Patient not taking: No sig reported) 900 mL 0   ondansetron (ZOFRAN-ODT) 8 MG  disintegrating tablet Take 8 mg by mouth 3 (three) times daily.     PROAIR HFA 108 (90 Base) MCG/ACT inhaler Inhale 2 puffs into the lungs every 6 (six) hours as needed for wheezing.     prochlorperazine (COMPAZINE) 5 MG tablet Take 1 tablet (5 mg total) by mouth every 6 (six) hours as needed for refractory nausea / vomiting. (Patient not taking: No sig reported) 20 tablet 0    Past Medical History:  Diagnosis Date   COPD (chronic obstructive pulmonary disease) (HCC)    Hypertension    Umbilical hernia     History reviewed. No pertinent surgical history.  Social History Social History   Tobacco Use   Smoking status: Former    Pack years: 0.00   Smokeless tobacco: Never  Substance Use Topics   Alcohol use: Not Currently   Drug use: Not Currently    Family  History Family History  Problem Relation Age of Onset   Hypertension Mother   No family history of bleeding/clotting disorders, porphyria or autoimmune disease   Allergies  Allergen Reactions   Sulfa Antibiotics Other (See Comments)     REVIEW OF SYSTEMS (Negative unless checked)  Constitutional: [] Weight loss  [] Fever  [] Chills Cardiac: [] Chest pain   [] Chest pressure   [] Palpitations   [] Shortness of breath when laying flat   [x] Shortness of breath at rest   [x] Shortness of breath with exertion. Vascular:  [x] Pain in legs with walking   [] Pain in legs at rest   [] Pain in legs when laying flat   [] Claudication   [] Pain in feet when walking  [] Pain in feet at rest  [] Pain in feet when laying flat   [] History of DVT   [] Phlebitis   [] Swelling in legs   [] Varicose veins   [] Non-healing ulcers Pulmonary:   [] Uses home oxygen   [] Productive cough   [] Hemoptysis   [] Wheeze  [] COPD   [] Asthma Neurologic:  [] Dizziness  [] Blackouts   [] Seizures   [] History of stroke   [] History of TIA  [] Aphasia   [] Temporary blindness   [] Dysphagia   [] Weakness or numbness in arms   [] Weakness or numbness in legs Musculoskeletal:  [] Arthritis   [] Joint swelling   [] Joint pain   [] Low back pain Hematologic:  [] Easy bruising  [] Easy bleeding   [] Hypercoagulable state   [] Anemic  [] Hepatitis Gastrointestinal:  [] Blood in stool   [] Vomiting blood  [] Gastroesophageal reflux/heartburn   [] Difficulty swallowing. Genitourinary:  [] Chronic kidney disease   [] Difficult urination  [] Frequent urination  [] Burning with urination   [] Blood in urine Skin:  [] Rashes   [] Ulcers   [] Wounds Psychological:  [] History of anxiety   []  History of major depression.    Physical Examination  Vitals:   07/17/20 1330 07/17/20 1345 07/17/20 1500 07/17/20 1643  BP: 118/88 (!) 134/99 (!) 137/94 (!) 149/102  Pulse:  83 85 88  Resp: (!) 30 20    Temp:      SpO2:  99% 99% 99%  Weight:      Height:       Body mass index is 45.19  kg/m.  Head: [place]/AT, No temporalis wasting. Prominent temp pulse not noted. Ear/Nose/Throat: Nares w/o erythema or drainage, oropharynx w/o obsrtuction, Mallampati score: Class IV.   Eyes: PERRLA, Sclera nonicteric.  Neck: Supple, no nuchal rigidity.  No bruit or JVD.  Pulmonary:  Breath sounds equal bilaterally, no use of accessory muscles.  Cardiac: RRR, normal S1, S2, no Murmurs, rubs or gallops. Vascular: Unable to  appreciate the aneurysm secondary to abdominal distention Gastrointestinal: Firm consistent with ascites, n no peritoneal signs.  Musculoskeletal: Moves all extremities.  No deformity or atrophy. No edema. Neurologic: CN 2-12 intact. Symmetrical.  Speech is fluent.  Psychiatric: Judgment intact, Mood & affect appropriate for pt's clinical situation. Dermatologic: No rashes or ulcers noted.  No cellulitis or open wounds.       CBC Lab Results  Component Value Date   WBC 15.0 (H) 07/17/2020   HGB 15.0 07/17/2020   HCT 44.0 07/17/2020   MCV 83.8 07/17/2020   PLT 454 (H) 07/17/2020    BMET    Component Value Date/Time   NA 127 (L) 07/17/2020 1004   K 3.5 07/17/2020 1004   CL 77 (L) 07/17/2020 1004   CO2 32 07/17/2020 1004   GLUCOSE 143 (H) 07/17/2020 1004   BUN 47 (H) 07/17/2020 1004   CREATININE 1.32 (H) 07/17/2020 1004   CALCIUM 9.2 07/17/2020 1004   GFRNONAA 60 (L) 07/17/2020 1004   Estimated Creatinine Clearance: 70.3 mL/min (A) (by C-G formula based on SCr of 1.32 mg/dL (H)).  COAG Lab Results  Component Value Date   INR 1.1 07/17/2020    Radiology I have personally reviewed the CT scan dated 07/17/2020 this is a noncontrasted exam which does show the aneurysm measures 5.7 cm in diameter.  It is infrarenal with approximately 20 mm neck.  I have personally reviewed the CT scan of the abdomen pelvis with contrast dated 05/31/2020 this 1 demonstrates a similar aneurysm although I agree maximal diameter is 5.1 cm otherwise the aneurysm is  unchanged.  Assessment/Plan 1.  Abdominal aortic aneurysm without rupture: Recommend: The aneurysm is > 5 cm and therefore should undergo repair. Patient is status post CT scan of the abdominal aorta. The patient is a candidate for endovascular repair.  He has experienced significant growth over the past 6 weeks and therefore repair is of a urgent but not emergent nature.  The patient will continue antiplatelet therapy as prescribed (since the patient is undergoing endovascular repair as opposed to open repair) as well as aggressive management of hyperlipidemia. Exercise is again strongly encouraged.   The patient is reminded that lifetime routine surveillance is a necessity with an endograft.   The risks and benefits of AAA repair are reviewed with the patient.  All questions are answered.  Alternative therapies are also discussed.  The patient agrees to proceed with endovascular aneurysm repair.   2.  Ascites associated with cirrhosis of the liver: Patient has undergone successful paracentesis and is feeling better.  We will have to coordinate with the GI service and internal medicine regarding optimizing his ascites at the time of endovascular repair.  3.  COPD: Continue pulmonary medications and aerosols as already ordered, these medications have been reviewed and there are no changes at this time.     Levora Dredge, MD  07/17/2020 6:02 PM

## 2020-07-17 NOTE — ED Triage Notes (Signed)
PT to ER via EMS from home with c/o increasing SHOB and abdominal distention. PT reports paracentesis approximately 1 month ago.

## 2020-07-17 NOTE — ED Provider Notes (Signed)
[facility] Emergency Department Provider Note  ____________________________________________   Event Date/Time   First MD Initiated Contact with Patient 07/17/20 [phone]     (approximate)  I have reviewed the triage vital signs and the nursing notes.   HISTORY  Chief Complaint Shortness of Breath   HPI Seth Haley is a 65 y.o. male  with medical history significant for hypertension, COPD, history of tobacco use, history of EtOH use, quit 2 years ago, obesity on 2 L of oxygen at baseline who presents via EMS from home for assessment of some increasing shortness of breath associate with abdominal distention and generalized abdominal pain over the last couple days.  Patient states that he has a chronic cough at baseline does not seem increased he has not had any new chest pain or back pain or urinary symptoms.  He does endorse some nausea and a little nonbilious emesis last couple days but no diarrhea or significant constipation.  Denies any rash or extremity pain, headache or earache, sore throat fever or other clear acute sick symptoms.  He does not think he has ever been diagnosed with varices but is not sure.         Past Medical History:  Diagnosis Date   COPD (chronic obstructive pulmonary disease) (HCC)    Hypertension    Umbilical hernia     Patient Active Problem List   Diagnosis Date Noted   SBP (spontaneous bacterial peritonitis) (HCC) 07/17/2020   Chronic respiratory failure (HCC)    Hyponatremia    Dehydration with hyponatremia    Transaminitis    Abdominal pain 06/01/2020   Ascites 05/31/2020   Essential hypertension 05/31/2020   COPD (chronic obstructive pulmonary disease) (HCC) 05/31/2020   Obesity, Class III, BMI 40-49.9 (morbid obesity) (HCC) 05/31/2020   At risk for obstructive sleep apnea 05/31/2020   Cirrhosis of liver with ascites (HCC) 05/31/2020    History reviewed. No pertinent surgical history.  Prior to Admission  medications   Medication Sig Start Date End Date Taking? Authorizing Provider  albuterol (PROVENTIL) (2.5 MG/3ML) 0.083% nebulizer solution Inhale 3 mLs into the lungs every 6 (six) hours as needed. 10/02/17  Yes [provider]  atenolol (TENORMIN) 25 MG tablet Take 25 mg by mouth 2 (two) times daily. 03/23/20  Yes [provider]  furosemide (LASIX) 20 MG tablet Take 3 tablets (60 mg total) by mouth daily. 06/06/20  Yes Tyrone Nine, MD  SPIRIVA HANDIHALER 18 MCG inhalation capsule Place 1 capsule into inhaler and inhale daily. 03/25/20  Yes [provider]  spironolactone (ALDACTONE) 50 MG tablet Take 3 tablets (150 mg total) by mouth daily. 06/05/20  Yes Tyrone Nine, MD  lactulose (CHRONULAC) 10 GM/15ML solution Take 15 mLs (10 g total) by mouth 2 (two) times daily. Patient not taking: No sig reported 06/05/20   Tyrone Nine, MD  ondansetron (ZOFRAN-ODT) 8 MG disintegrating tablet Take 8 mg by mouth 3 (three) times daily. 06/16/20   [provider]  PROAIR HFA 108 (90 Base) MCG/ACT inhaler Inhale 2 puffs into the lungs every 6 (six) hours as needed for wheezing. 03/26/20   [provider]  prochlorperazine (COMPAZINE) 5 MG tablet Take 1 tablet (5 mg total) by mouth every 6 (six) hours as needed for refractory nausea / vomiting. Patient not taking: No sig reported 06/05/20   Tyrone Nine, MD    Allergies Sulfa antibiotics  Family History  Problem Relation Age of Onset  Hypertension Mother     Social History Social History   Tobacco Use   Smoking status: Former    Pack years: 0.00   Smokeless tobacco: Never  Substance Use Topics   Alcohol use: Not Currently   Drug use: Not Currently    Review of Systems  Review of Systems  Constitutional:  Negative for chills and fever.  HENT:  Negative for sore throat.   Eyes:  Negative for pain.  Respiratory:  Positive for cough (chronic) and sputum production. Negative for stridor.    Cardiovascular:  Negative for chest pain.  Gastrointestinal:  Positive for abdominal pain, diarrhea and nausea. Negative for vomiting.  Genitourinary:  Negative for dysuria.  Musculoskeletal:  Negative for myalgias.  Skin:  Negative for rash.  Neurological:  Negative for seizures, loss of consciousness and headaches.  Psychiatric/Behavioral:  Negative for suicidal ideas.   All other systems reviewed and are negative.    ____________________________________________   PHYSICAL EXAM:  VITAL SIGNS: ED Triage Vitals  Enc Vitals Group     BP 07/17/20 0950 (!) 115/96     Pulse Rate 07/17/20 0950 95     Resp 07/17/20 0950 18     Temp --      Temp src --      SpO2 07/17/20 0950 99 %     Weight --      Height --      Head Circumference --      Peak Flow --      Pain Score 07/17/20 0952 0     Pain Loc --      Pain Edu? --      Excl. in GC? --    Vitals:   07/17/20 1345 07/17/20 1500  BP: (!) 134/99 (!) 137/94  Pulse: 83 85  Resp: 20   Temp:    SpO2: 99% 99%   Physical Exam Vitals and nursing note reviewed.  Constitutional:      Appearance: He is well-developed.  HENT:     Head: Normocephalic and atraumatic.     Right Ear: External ear normal.     Left Ear: External ear normal.     Nose: Nose normal.     Mouth/Throat:     Mouth: Mucous membranes are dry.  Eyes:     Conjunctiva/sclera: Conjunctivae normal.  Cardiovascular:     Rate and Rhythm: Normal rate and regular rhythm.     Heart sounds: No murmur heard. Pulmonary:     Effort: Pulmonary effort is normal. Tachypnea present. No respiratory distress.     Breath sounds: Decreased breath sounds present.  Abdominal:     General: There is distension.     Palpations: Abdomen is soft. There is hepatomegaly.     Tenderness: There is generalized abdominal tenderness. There is no right CVA tenderness, left CVA tenderness or guarding.  Musculoskeletal:     Cervical back: Neck supple.     Right lower leg: No edema.      Left lower leg: No edema.  Skin:    General: Skin is warm and dry.     Capillary Refill: Capillary refill takes 2 to 3 seconds.  Neurological:     Mental Status: He is alert and oriented to person, place, and time.  Psychiatric:        Mood and Affect: Mood normal.     ____________________________________________   LABS (all labs ordered are listed, but only abnormal results are displayed)  Labs Reviewed  CBC WITH DIFFERENTIAL/PLATELET -  Abnormal; Notable for the following components:      Result Value   WBC 15.0 (*)    Platelets 454 (*)    Neutro Abs 13.2 (*)    Lymphs Abs 0.6 (*)    Abs Immature Granulocytes 0.15 (*)    All other components within normal limits  COMPREHENSIVE METABOLIC PANEL - Abnormal; Notable for the following components:   Sodium 127 (*)    Chloride 77 (*)    Glucose, Bld 143 (*)    BUN 47 (*)    Creatinine, Ser 1.32 (*)    Albumin 3.2 (*)    AST 111 (*)    ALT 259 (*)    Total Bilirubin 2.6 (*)    GFR, Estimated 60 (*)    Anion gap 18 (*)    All other components within normal limits  TROPONIN I (HIGH SENSITIVITY) - Abnormal; Notable for the following components:   Troponin I (High Sensitivity) 32 (*)    All other components within normal limits  TROPONIN I (HIGH SENSITIVITY) - Abnormal; Notable for the following components:   Troponin I (High Sensitivity) 28 (*)    All other components within normal limits  RESP PANEL BY RT-PCR (FLU A&B, COVID) ARPGX2  CULTURE, BLOOD (ROUTINE X 2)  CULTURE, BLOOD (ROUTINE X 2)  LIPASE, BLOOD  MAGNESIUM  APTT  PROTIME-INR  AMMONIA  LACTIC ACID, PLASMA  URINALYSIS, COMPLETE (UACMP) WITH MICROSCOPIC   ____________________________________________  EKG  Sinus rhythm with a ventricular of 93, normal axis, unremarkable intervals without clearance of acute ischemia or significant Arrhythmia. ____________________________________________  RADIOLOGY  ED MD interpretation: CT abdomen pelvis remarkable for  large ascites with nodularity of liver suggestive of cirrhosis and a small right pleural effusion.  There is also cholelithiasis without evidence of cholecystitis and diverticulosis without evidence of diverticulitis.  Small periumbilical hernia without evidence of obstruction and infrarenal abdominal aortic aneurysm.  Right upper quadrant ultrasound shows hepatic cirrhosis and cholelithiasis with sludge but no evidence of cholecystitis or abnormal dilation of the common bile duct.  Chest x-ray shows no focal consolidation, overt edema, no thorax or other clear acute intrathoracic process.  Official radiology report(s): CT ABDOMEN PELVIS WO CONTRAST  Result Date: 07/17/2020 CLINICAL DATA:  Abdominal distension, shortness of breath EXAM: CT ABDOMEN AND PELVIS WITHOUT CONTRAST TECHNIQUE: Multidetector CT imaging of the abdomen and pelvis was performed following the standard protocol without IV contrast. COMPARISON:  CT 05/31/2020 FINDINGS: Lower chest: Small right pleural effusion with associated compressive atelectasis. Left lung bases clear. Heart size is normal. Hepatobiliary: Subtle surface nodularity of the liver suggesting underlying cirrhosis. No focal liver lesion is identified on noncontrast study. Cholelithiasis without evidence to suggest cholecystitis. No biliary dilatation. Pancreas: Unremarkable. No pancreatic ductal dilatation or surrounding inflammatory changes. Spleen: Spleen is upper limits of normal in size. Adrenals/Urinary Tract: Unremarkable adrenal glands. Stable upper pole right renal cyst. Tiny punctate calcification within midpole of the left kidney which may represent a vascular calcification or nonobstructing stone. No hydronephrosis. Urinary bladder is decompressed, limiting its evaluation. Stomach/Bowel: Stomach is within normal limits. Appendix not definitively identified. Colonic diverticulosis. No evidence of bowel wall thickening, distention, or inflammatory changes. Moderate  volume stool throughout the colon Vascular/Lymphatic: Aortoiliac atherosclerosis with infrarenal abdominal aortic aneurysm measuring 5.7 cm in maximal transverse dimension (series 2, image 51) previously 5.1 cm. No abdominopelvic lymphadenopathy. Reproductive: Prostate is unremarkable. Other: Large volume ascites. No definite omental or peritoneal masses identified on noncontrast study. No pneumoperitoneum. Paraumbilical hernia containing small  bowel without evidence of obstruction. Musculoskeletal: No acute or significant osseous findings. IMPRESSION: 1. Large volume ascites. 2. Subtle surface nodularity of the liver suggesting underlying cirrhosis. 3. Small right pleural effusion with associated compressive atelectasis. 4. Cholelithiasis without evidence to suggest cholecystitis. 5. Colonic diverticulosis without evidence of acute diverticulitis. 6. Paraumbilical hernia containing small bowel without evidence of obstruction. 7. 5.7 cm infrarenal abdominal aortic aneurysm, previously 5.1 cm. Recommend referral to a vascular specialist. This recommendation follows ACR consensus guidelines: White Paper of the ACR Incidental Findings Committee II on Vascular Findings. J Am Coll Radiol 2013; 10:789-794. 8. Aortic atherosclerosis (ICD10-I70.0). Electronically Signed   By: Duanne Guess D.O.   On: 07/17/2020 11:07   DG Chest Portable 1 View  Result Date: 07/17/2020 CLINICAL DATA:  Shortness of breath. EXAM: PORTABLE CHEST 1 VIEW COMPARISON:  06/01/2020 FINDINGS: The heart size and mediastinal contours are within normal limits. Similar chronic interstitial coarsening within the lower lung zones. Both lungs are clear. The visualized skeletal structures are unremarkable. IMPRESSION: No active cardiopulmonary abnormalities. Electronically Signed   By: Signa Kell M.D.   On: 07/17/2020 10:42   US ABDOMEN LIMITED RUQ (LIVER/GB)  Result Date: 07/17/2020 CLINICAL DATA:  Transaminitis. EXAM: ULTRASOUND ABDOMEN  LIMITED RIGHT UPPER QUADRANT COMPARISON:  CT of same day. FINDINGS: Gallbladder: Cholelithiasis and sludge is noted. No gallbladder wall thickening is noted. No sonographic Murphy's sign is noted. Common bile duct: Diameter: 6 mm which is within normal limits. Liver: No focal lesion identified. Increased echogenicity of hepatic parenchyma is noted with nodular contours suggesting hepatic cirrhosis. Portal vein is patent on color Doppler imaging with normal direction of blood flow towards the liver. Other: Moderate ascites is noted. IMPRESSION: Findings consistent with hepatic cirrhosis. No definite focal sonographic hepatic abnormality is noted. Cholelithiasis and sludge is noted without definite evidence of cholecystitis. Moderate ascites. Electronically Signed   By: Lupita Raider M.D.   On: 07/17/2020 12:39    ____________________________________________   PROCEDURES  Procedure(s) performed (including Critical Care):  .Critical Care  Date/Time: 07/17/2020 3:51 PM Performed by: Gilles Chiquito, MD Authorized by: Gilles Chiquito, MD   Critical care provider statement:    Critical care time (minutes):  45   Critical care was time spent personally by me on the following activities:  Discussions with consultants, evaluation of patient's response to treatment, examination of patient, ordering and performing treatments and interventions, ordering and review of laboratory studies, ordering and review of radiographic studies, pulse oximetry, re-evaluation of patient's condition, obtaining history from patient or surrogate and review of old charts   ____________________________________________   INITIAL IMPRESSION / ASSESSMENT AND PLAN / ED COURSE        Patient presents with above to history exam for assessment of some shortness of breath associate with some generalized abdominal discomfort and worsening distention of the last couple days.  On arrival he is afebrile and hemodynamically stable  or slight tachypneic on his 2 L.  His lungs are clear bilaterally although diminished and his abdomen is quite distended and mildly tender throughout.  He has not guarding and does not seem peritoneal.  No CVA tenderness.  Primary differential is possible shortness of breath related to elevation of the diaphragm from worsening abdominal ascites, cholecystitis, pancreatitis, gastritis, diverticulitis, SBO and Portal venous hypertension causing ascites.  For shortness of breath he has no wheezing no hypoxia to suggest acute COPD exacerbation.  He is little tachypneic chest x-ray shows no evidence of acute volume overload  or pneumonia.  CT abdomen pelvis remarkable for large ascites with nodularity of liver suggestive of cirrhosis and a small right pleural effusion.  There is also cholelithiasis without evidence of cholecystitis and diverticulosis without evidence of diverticulitis.  Small periumbilical hernia without evidence of obstruction and infrarenal abdominal aortic aneurysm.  Right upper quadrant ultrasound shows hepatic cirrhosis and cholelithiasis with sludge but no evidence of cholecystitis or abnormal dilation of the common bile duct.  Suspect patient's symptoms likely related to his ascites possibly causing some compressive atelectasis in the right base given the small effusion.  Troponins stable at 32 and 28 which I suspect likely represents some mild demand ischemia.  Suspicion for acute coronary thrombosis at this time.  CMP remarkable for creatinine of 1.32 representing AKI as previous creatinine 0.4243-month ago.  Patient also has transaminitis with an AST of 111, ALT of 259 and T bili of 2.6 which all appear new.  However unclear etiology as, bile duct does not appear significantly dilated and there are no stones seen on this.  Lipase not consistent with acute pancreatitis.  Exam unremarkable.  Ammonia WNL.  CBC with leukocytosis with WBC count of 15 but no acute anemia and normal  platelets.  Given report of abdominal pain distention with leukocytosis and tachypnea on arrival concern for sepsis from possible possible spontaneous bacterial tinnitus and sepsis.  I will obtain blood cultures give IV fluids and give patient a dose of Zosyn.  I do think he requires paracentesis.  He will be admitted to medicine service to undergo IR paracentesis.   ____________________________________________   FINAL CLINICAL IMPRESSION(S) / ED DIAGNOSES  Final diagnoses:  Transaminitis  Other ascites  Generalized abdominal pain  Troponin I above reference range  Pleural effusion  Cirrhosis of liver without ascites, unspecified hepatic cirrhosis type (HCC)  AKI (acute kidney injury) (HCC)    Medications  atenolol (TENORMIN) tablet 25 mg (has no administration in time range)  lactulose (CHRONULAC) 10 GM/15ML solution 10 g (has no administration in time range)  albuterol (PROVENTIL) (2.5 MG/3ML) 0.083% nebulizer solution 3 mL (has no administration in time range)  tiotropium (SPIRIVA) inhalation capsule ([facility] use ONLY) 18 mcg (has no administration in time range)  sodium chloride flush (NS) 0.9 % injection 3 mL (has no administration in time range)  sodium chloride flush (NS) 0.9 % injection 3 mL (has no administration in time range)  0.9 %  sodium chloride infusion (has no administration in time range)  ondansetron (ZOFRAN) tablet 4 mg (has no administration in time range)    Or  ondansetron (ZOFRAN) injection 4 mg (has no administration in time range)  piperacillin-tazobactam (ZOSYN) IVPB 3.375 g (0 g Intravenous Stopped 07/17/20 1503)  lactated ringers bolus 1,000 mL (0 mLs Intravenous Stopped 07/17/20 1503)     ED Discharge Orders     None        Note:  This document was prepared using Dragon voice recognition software and may include unintentional dictation errors.    Gilles ChiquitoSmith, Axell Trigueros P, MD 07/17/20 [phone]

## 2020-07-18 LAB — URINALYSIS, COMPLETE (UACMP) WITH MICROSCOPIC
Bilirubin Urine: NEGATIVE
Glucose, UA: NEGATIVE mg/dL
Hgb urine dipstick: NEGATIVE
Ketones, ur: NEGATIVE mg/dL
Leukocytes,Ua: NEGATIVE
Nitrite: NEGATIVE
Protein, ur: NEGATIVE mg/dL
Specific Gravity, Urine: 1.018 (ref 1.005–1.030)
Squamous Epithelial / HPF: NONE SEEN (ref 0–5)
pH: 5 (ref 5.0–8.0)

## 2020-07-18 LAB — BASIC METABOLIC PANEL
Anion gap: 15 (ref 5–15)
BUN: 47 mg/dL — ABNORMAL HIGH (ref 8–23)
CO2: 37 mmol/L — ABNORMAL HIGH (ref 22–32)
Calcium: 9.1 mg/dL (ref 8.9–10.3)
Chloride: 77 mmol/L — ABNORMAL LOW (ref 98–111)
Creatinine, Ser: 1.4 mg/dL — ABNORMAL HIGH (ref 0.61–1.24)
GFR, Estimated: 56 mL/min — ABNORMAL LOW (ref >60)
Glucose, Bld: 140 mg/dL — ABNORMAL HIGH (ref 70–99)
Potassium: 3.9 mmol/L (ref 3.5–5.1)
Sodium: 129 mmol/L — ABNORMAL LOW (ref 135–145)

## 2020-07-18 LAB — CBC
HCT: 47.7 % (ref 39.0–52.0)
Hemoglobin: 16.1 g/dL (ref 13.0–17.0)
MCH: 28.6 pg (ref 26.0–34.0)
MCHC: 33.8 g/dL (ref 30.0–36.0)
MCV: 84.9 fL (ref 80.0–100.0)
Platelets: 431 10*3/uL — ABNORMAL HIGH (ref 150–400)
RBC: 5.62 MIL/uL (ref 4.22–5.81)
RDW: 14.6 % (ref 11.5–15.5)
WBC: 16.2 10*3/uL — ABNORMAL HIGH (ref 4.0–10.5)
nRBC: 0 % (ref 0.0–0.2)

## 2020-07-18 MED ORDER — SODIUM CHLORIDE 0.9 % IV SOLN
2.0000 g | INTRAVENOUS | Status: DC
  Administered 2020-07-18 – 2020-07-19 (×2): 2 g via INTRAVENOUS
  Filled 2020-07-18 (×2): qty 2

## 2020-07-18 MED ORDER — CLONAZEPAM 0.25 MG PO TBDP
0.5000 mg | ORAL_TABLET | Freq: Two times a day (BID) | ORAL | Status: DC | PRN
  Administered 2020-07-18 – 2020-07-19 (×2): 0.5 mg via ORAL
  Filled 2020-07-18 (×2): qty 2

## 2020-07-18 MED ORDER — TRAZODONE HCL 50 MG PO TABS
50.0000 mg | ORAL_TABLET | Freq: Every day | ORAL | Status: DC
  Administered 2020-07-18 – 2020-07-21 (×4): 50 mg via ORAL
  Filled 2020-07-18 (×4): qty 1

## 2020-07-18 NOTE — Progress Notes (Addendum)
Subjective/Chief Complaint: Patient has some anxiety.  Abdomen discomfort has improved.  He states having shortness of  breath at home.  He denies any chills.   Objective: Vital signs in last 24 hours: Temp:  [97.5 F (36.4 C)-97.9 F (36.6 C)] 97.5 F (36.4 C) (06/11 0452) Pulse Rate:  [69-92] 88 (06/11 0452) Resp:  [18-38] 20 (06/11 0452) BP: (96-149)/(70-102) 96/70 (06/11 0452) SpO2:  [90 %-100 %] 98 % (06/11 0452) Last BM Date:  (Pt noted possbly two weeks ago)  Intake/Output from previous day: 06/10 0701 - 06/11 0700 In: -  Out: 600 [Urine:400; Emesis/NG output:200] Intake/Output this shift: No intake/output data recorded.  General appearance: alert, cooperative, appears older than stated age, no distress, and moderately obese Head: Normocephalic, without obvious abnormality, atraumatic Eyes: conjunctivae/corneas clear. PERRL, EOM's intact. Fundi benign. Neck: no adenopathy, no carotid bruit, no JVD, supple, symmetrical, trachea midline, and thyroid not enlarged, symmetric, no tenderness/mass/nodules Back: symmetric, no curvature. ROM normal. No CVA tenderness. Resp: clear to auscultation bilaterally and normal percussion bilaterally Chest wall: no tenderness GI: abnormal findings:  ascites and Right lower quadrante paracentesis access site.  Extremities: extremities normal, atraumatic, no cyanosis or edema and Thenar atrophy bilaterally.  Pulses:  L brachial 2+ R brachial 2+  L radial 2+ R radial 2+  L inguinal 2+ R inguinal 2+  L popliteal 2+ R popliteal 2+  L posterior tibial 2+ R posterior tibial 2+  L dorsalis pedis 2+ R dorsalis pedis 2+   Right Pulses: FEM: present 1+, POP: present 1+, DP: present 1+, PT: present 1+ Left Pulses: FEM: present 1+, POP: present 1+, DP: present 1+, PT: present 1+ Skin: Skin color, texture, turgor normal. No rashes or lesions or nails normal without clubbing Normal capillary refill  Lab Results:  Recent Labs     07/17/20 1004 07/18/20 0455  WBC 15.0* 16.2*  HGB 15.0 16.1  HCT 44.0 47.7  PLT 454* 431*   BMET Recent Labs    07/17/20 1004 07/18/20 0455  NA 127* 129*  K 3.5 3.9  CL 77* 77*  CO2 32 37*  GLUCOSE 143* 140*  BUN 47* 47*  CREATININE 1.32* 1.40*  CALCIUM 9.2 9.1   PT/INR Recent Labs    07/17/20 1004  LABPROT 14.4  INR 1.1   ABG No results for input(s): PHART, HCO3 in the last 72 hours.  Invalid input(s): PCO2, PO2  Studies/Results: CT ABDOMEN PELVIS WO CONTRAST  Result Date: 07/17/2020 CLINICAL DATA:  Abdominal distension, shortness of breath EXAM: CT ABDOMEN AND PELVIS WITHOUT CONTRAST TECHNIQUE: Multidetector CT imaging of the abdomen and pelvis was performed following the standard protocol without IV contrast. COMPARISON:  CT 05/31/2020 FINDINGS: Lower chest: Small right pleural effusion with associated compressive atelectasis. Left lung bases clear. Heart size is normal. Hepatobiliary: Subtle surface nodularity of the liver suggesting underlying cirrhosis. No focal liver lesion is identified on noncontrast study. Cholelithiasis without evidence to suggest cholecystitis. No biliary dilatation. Pancreas: Unremarkable. No pancreatic ductal dilatation or surrounding inflammatory changes. Spleen: Spleen is upper limits of normal in size. Adrenals/Urinary Tract: Unremarkable adrenal glands. Stable upper pole right renal cyst. Tiny punctate calcification within midpole of the left kidney which may represent a vascular calcification or nonobstructing stone. No hydronephrosis. Urinary bladder is decompressed, limiting its evaluation. Stomach/Bowel: Stomach is within normal limits. Appendix not definitively identified. Colonic diverticulosis. No evidence of bowel wall thickening, distention, or inflammatory changes. Moderate volume stool throughout the colon Vascular/Lymphatic: Aortoiliac atherosclerosis with infrarenal abdominal aortic  aneurysm measuring 5.7 cm in maximal transverse  dimension (series 2, image 51) previously 5.1 cm. No abdominopelvic lymphadenopathy. Reproductive: Prostate is unremarkable. Other: Large volume ascites. No definite omental or peritoneal masses identified on noncontrast study. No pneumoperitoneum. Paraumbilical hernia containing small bowel without evidence of obstruction. Musculoskeletal: No acute or significant osseous findings. IMPRESSION: 1. Large volume ascites. 2. Subtle surface nodularity of the liver suggesting underlying cirrhosis. 3. Small right pleural effusion with associated compressive atelectasis. 4. Cholelithiasis without evidence to suggest cholecystitis. 5. Colonic diverticulosis without evidence of acute diverticulitis. 6. Paraumbilical hernia containing small bowel without evidence of obstruction. 7. 5.7 cm infrarenal abdominal aortic aneurysm, previously 5.1 cm. Recommend referral to a vascular specialist. This recommendation follows ACR consensus guidelines: White Paper of the ACR Incidental Findings Committee II on Vascular Findings. J Am Coll Radiol 2013; 10:789-794. 8. Aortic atherosclerosis (ICD10-I70.0). Electronically Signed   By: Duanne Guess D.O.   On: 07/17/2020 11:07   US Paracentesis  Result Date: 07/17/2020 INDICATION: Recurrent ascites EXAM: ULTRASOUND GUIDED  PARACENTESIS MEDICATIONS: None. COMPLICATIONS: None immediate. PROCEDURE: Informed written consent was obtained from the patient after a discussion of the risks, benefits and alternatives to treatment. A timeout was performed prior to the initiation of the procedure. Initial ultrasound scanning demonstrates a large amount of ascites within the right lower abdominal quadrant. The right lower abdomen was prepped and draped in the usual sterile fashion. 1% lidocaine was used for local anesthesia. Following this, a 6 Fr Safe-T-Centesis catheter was introduced. An ultrasound image was saved for documentation purposes. The paracentesis was performed. The catheter was  removed and a dressing was applied. The patient tolerated the procedure well without immediate post procedural complication. FINDINGS: A total of approximately 8 L of straw-colored clear fluid was removed. Samples were sent to the laboratory as requested by the clinical team. IMPRESSION: Successful ultrasound-guided paracentesis yielding 8 L liters of peritoneal fluid. Electronically Signed   By: Acquanetta Belling M.D.   On: 07/17/2020 17:14   DG Chest Portable 1 View  Result Date: 07/17/2020 CLINICAL DATA:  Shortness of breath. EXAM: PORTABLE CHEST 1 VIEW COMPARISON:  06/01/2020 FINDINGS: The heart size and mediastinal contours are within normal limits. Similar chronic interstitial coarsening within the lower lung zones. Both lungs are clear. The visualized skeletal structures are unremarkable. IMPRESSION: No active cardiopulmonary abnormalities. Electronically Signed   By: Signa Kell M.D.   On: 07/17/2020 10:42   US ABDOMEN LIMITED RUQ (LIVER/GB)  Result Date: 07/17/2020 CLINICAL DATA:  Transaminitis. EXAM: ULTRASOUND ABDOMEN LIMITED RIGHT UPPER QUADRANT COMPARISON:  CT of same day. FINDINGS: Gallbladder: Cholelithiasis and sludge is noted. No gallbladder wall thickening is noted. No sonographic Murphy's sign is noted. Common bile duct: Diameter: 6 mm which is within normal limits. Liver: No focal lesion identified. Increased echogenicity of hepatic parenchyma is noted with nodular contours suggesting hepatic cirrhosis. Portal vein is patent on color Doppler imaging with normal direction of blood flow towards the liver. Other: Moderate ascites is noted. IMPRESSION: Findings consistent with hepatic cirrhosis. No definite focal sonographic hepatic abnormality is noted. Cholelithiasis and sludge is noted without definite evidence of cholecystitis. Moderate ascites. Electronically Signed   By: Lupita Raider M.D.   On: 07/17/2020 12:39    Anti-infectives: Anti-infectives (From admission, onward)    Start      Dose/Rate Route Frequency Ordered Stop   07/18/20 1000  cefTRIAXone (ROCEPHIN) 2 g in sodium chloride 0.9 % 100 mL IVPB  2 g 200 mL/hr over 30 Minutes Intravenous Every 24 hours 07/18/20 0845     07/17/20 1230  piperacillin-tazobactam (ZOSYN) IVPB 3.375 g        3.375 g 100 mL/hr over 30 Minutes Intravenous  Once 07/17/20 1226 07/17/20 1503       Assessment/Plan: s/p * No surgery found * I will obtain pulmonary, cardiac, and medical clearance in preparation of EVAR surgery for rapidly growing urgent AAA.   LOS: 1 day    Louisa Second 07/18/2020

## 2020-07-18 NOTE — Progress Notes (Addendum)
1       [place] at [facility]   PATIENT NAME: Seth Haley    MR#:  [id number]  DATE OF BIRTH:  08-22-1955  SUBJECTIVE:  CHIEF COMPLAINT:   Chief Complaint  Patient presents with   Shortness of Breath  Reports subjective shortness of breath.  Asking for anxiety medicine and something to sleep at night.  Sister at bedside REVIEW OF SYSTEMS:  Review of Systems  Constitutional:  Positive for malaise/fatigue. Negative for diaphoresis, fever and weight loss.  HENT:  Negative for ear discharge, ear pain, hearing loss, nosebleeds, sore throat and tinnitus.   Eyes:  Negative for blurred vision and pain.  Respiratory:  Positive for shortness of breath. Negative for cough, hemoptysis and wheezing.   Cardiovascular:  Negative for chest pain, palpitations, orthopnea and leg swelling.  Gastrointestinal:  Negative for abdominal pain, blood in stool, constipation, diarrhea, heartburn, nausea and vomiting.  Genitourinary:  Negative for dysuria, frequency and urgency.  Musculoskeletal:  Negative for back pain and myalgias.  Skin:  Negative for itching and rash.  Neurological:  Negative for dizziness, tingling, tremors, focal weakness, seizures, weakness and headaches.  Psychiatric/Behavioral:  Negative for depression. The patient is nervous/anxious and has insomnia.   DRUG ALLERGIES:   Allergies  Allergen Reactions   Sulfa Antibiotics Other (See Comments)   VITALS:  Blood pressure 117/82, pulse 76, temperature 97.6 F (36.4 C), temperature source Oral, resp. rate 18, height 5\' 6"  (1.676 m), weight 127 kg, SpO2 98 %. PHYSICAL EXAMINATION:  Physical Exam 65 year old male lying in the bed comfortably without any acute distress Eyes pupils equal round reactive light accommodation Lungs decreased breath sound the bases.  No wheezing, rales or crepitation Cardiovascular S1-S2 normal, no murmur rales or gallop Abdomen firm, minimally distended, hypoactive bowel sounds Neuro alert and  oriented, nonfocal exam Skin no rash or lesion LABORATORY PANEL:  Male CBC Recent Labs  Lab 07/18/20 0455  WBC 16.2*  HGB 16.1  HCT 47.7  PLT 431*   ------------------------------------------------------------------------------------------------------------------ Chemistries  Recent Labs  Lab 07/17/20 1004 07/18/20 0455  NA 127* 129*  K 3.5 3.9  CL 77* 77*  CO2 32 37*  GLUCOSE 143* 140*  BUN 47* 47*  CREATININE 1.32* 1.40*  CALCIUM 9.2 9.1  MG 2.4  --   AST 111*  --   ALT 259*  --   ALKPHOS 111  --   BILITOT 2.6*  --    RADIOLOGY:  09/17/20 Paracentesis  Result Date: 07/17/2020 INDICATION: Recurrent ascites EXAM: ULTRASOUND GUIDED  PARACENTESIS MEDICATIONS: None. COMPLICATIONS: None immediate. PROCEDURE: Informed written consent was obtained from the patient after a discussion of the risks, benefits and alternatives to treatment. A timeout was performed prior to the initiation of the procedure. Initial ultrasound scanning demonstrates a large amount of ascites within the right lower abdominal quadrant. The right lower abdomen was prepped and draped in the usual sterile fashion. 1% lidocaine was used for local anesthesia. Following this, a 6 Fr Safe-T-Centesis catheter was introduced. An ultrasound image was saved for documentation purposes. The paracentesis was performed. The catheter was removed and a dressing was applied. The patient tolerated the procedure well without immediate post procedural complication. FINDINGS: A total of approximately 8 L of straw-colored clear fluid was removed. Samples were sent to the laboratory as requested by the clinical team. IMPRESSION: Successful ultrasound-guided paracentesis yielding 8 L liters of peritoneal fluid. Electronically Signed   By: 09/16/2020 M.D.   On: 07/17/2020 17:14  ASSESSMENT AND PLAN:  65 y.o. male with medical history significant for hypertension, COPD with chronic respiratory failure on 2 L of oxygen continuous, history of  liver cirrhosis with portal hypertension and splenomegaly who presents to the emergency room via EMS for evaluation of increased abdominal girth, lower abdominal pain as well as worsening shortness of breath.  Patient was recently hospitalized about a month ago and had paracentesis done at the time.  Spontaneous bacterial peritonitis/ascites Patient has a history of liver cirrhosis and admitted for evaluation of abdominal distention associated with pain.  He denies having any fever or chills Continue antibiotics empirically for SBP with Rocephin 2 g IV daily.  If culture negative we can stop antibiotics IR guided paracentesis on 6/10 with removal of 8 L of peritoneal fluid.  Fluid cultures pending  Insomnia/anxiety I have added Klonopin 0.5 mg p.o. twice daily as needed and trazodone at night for sleep   Abdominal aortic aneurysm without rupture Incidental finding with CT abdomen showing aneurysm measuring 5.7 cm in diameter Appreciate vascular surgery input.  They are planning for EVAR surgery for rapidly growing urgent AAA. They have asked for pulmonary and cardiac clearance which I have requested Patient is medically clear for planned procedure   COPD with chronic respiratory failure Not acutely exacerbated Worsening shortness of breath appears to be secondary to massive ascites compressing on the diaphragm. Continue as needed bronchodilator therapy Continue home oxygen at 2 L to maintain pulse oximetry greater than 92%   History of liver cirrhosis with portal hypertension and splenomegaly Maintain low-sodium diet Hold diuretics for now due to significant dehydration and electrolyte abnormalities Continue lactulose Continue low-dose beta-blocker   Dehydration with hyponatremia Secondary to poor oral intake and concomitant diuretic use Hold diuretics for now Encourage oral fluid intake Sodium improving with hydration   Morbid obesity (BMI 45.19) Complicates overall  prognosis. Lifestyle modification and exercise has been discussed with patient   Transaminitis Likely due to worsening liver cirrhosis Monitor liver enzymes    Body mass index is 45.19 kg/m.  Net IO Since Admission: -600 mL [07/18/20 1314]      Status is: Inpatient  Remains inpatient appropriate because:Ongoing diagnostic testing needed not appropriate for outpatient work up  Dispo: The patient is from: Home              Anticipated d/c is to: Home              Patient currently is not medically stable to d/c.   Difficult to place patient No       DVT prophylaxis:       SCDs Start: 07/17/20 1441     Family Communication: Updated Sister at bedside on 6/11   All the records are reviewed and case discussed with Care Management/Social Worker. Management plans discussed with the patient, family and they are in agreement.  CODE STATUS: Full Code Level of care: Med-Surg  TOTAL TIME TAKING CARE OF THIS PATIENT: 25 minutes.   More than 50% of the time was spent in counseling/coordination of care: YES  POSSIBLE D/C IN 4-5 DAYS, DEPENDING ON CLINICAL CONDITION.  And vascular surgery intervention   Delfino Lovett M.D on 07/18/2020 at 1:14 PM  [facility]   CC: Primary care physician; Simonne Martinet, NP  Note: This dictation was prepared with Dragon dictation along with smaller phrase technology. Any transcriptional errors that result from this process are unintentional.

## 2020-07-18 NOTE — Plan of Care (Signed)
Continuing with plan of care. 

## 2020-07-18 NOTE — Progress Notes (Signed)
Urine output for the shift was , dark and odorous.  Dr. Sherryll Burger notified and will continue to monitor.

## 2020-07-18 NOTE — Progress Notes (Addendum)
DX SBP had Paracentesis before coming to unit, 8 L fluid off. Pt has HX COPD and feels like he is hyperventilating. Resp. 24-28. Nebs TX Q 6 hr PRN given with little relief. Pt. on 1.5 L O2 SATs 95-96. Pt restless and cannot sleep. Pt also has some nausea and vomiting, approx. 200 mls emesis after taking Lactulose and drinking fluids. Zofran given for nausea. Pt noted no BM for approx. 2 week that his diet consists of mainly Boosts and that he weighted approx 268 lbs, Pt now weights 206 lbs.

## 2020-07-19 ENCOUNTER — Inpatient Hospital Stay: Payer: Medicare Other

## 2020-07-19 DIAGNOSIS — K420 Umbilical hernia with obstruction, without gangrene: Secondary | ICD-10-CM

## 2020-07-19 DIAGNOSIS — K652 Spontaneous bacterial peritonitis: Secondary | ICD-10-CM | POA: Diagnosis not present

## 2020-07-19 DIAGNOSIS — J9610 Chronic respiratory failure, unspecified whether with hypoxia or hypercapnia: Secondary | ICD-10-CM | POA: Diagnosis not present

## 2020-07-19 DIAGNOSIS — J449 Chronic obstructive pulmonary disease, unspecified: Secondary | ICD-10-CM

## 2020-07-19 DIAGNOSIS — K7031 Alcoholic cirrhosis of liver with ascites: Secondary | ICD-10-CM

## 2020-07-19 DIAGNOSIS — I10 Essential (primary) hypertension: Secondary | ICD-10-CM

## 2020-07-19 LAB — OSMOLALITY: Osmolality: 277 mOsm/kg (ref 275–295)

## 2020-07-19 LAB — CBC
HCT: 43.5 % (ref 39.0–52.0)
Hemoglobin: 14.7 g/dL (ref 13.0–17.0)
MCH: 29.2 pg (ref 26.0–34.0)
MCHC: 33.8 g/dL (ref 30.0–36.0)
MCV: 86.5 fL (ref 80.0–100.0)
Platelets: 355 10*3/uL (ref 150–400)
RBC: 5.03 MIL/uL (ref 4.22–5.81)
RDW: 14.6 % (ref 11.5–15.5)
WBC: 13.3 10*3/uL — ABNORMAL HIGH (ref 4.0–10.5)
nRBC: 0 % (ref 0.0–0.2)

## 2020-07-19 LAB — BASIC METABOLIC PANEL
Anion gap: 13 (ref 5–15)
BUN: 45 mg/dL — ABNORMAL HIGH (ref 8–23)
CO2: 39 mmol/L — ABNORMAL HIGH (ref 22–32)
Calcium: 8.8 mg/dL — ABNORMAL LOW (ref 8.9–10.3)
Chloride: 77 mmol/L — ABNORMAL LOW (ref 98–111)
Creatinine, Ser: 1.52 mg/dL — ABNORMAL HIGH (ref 0.61–1.24)
GFR, Estimated: 51 mL/min — ABNORMAL LOW (ref >60)
Glucose, Bld: 135 mg/dL — ABNORMAL HIGH (ref 70–99)
Potassium: 4.2 mmol/L (ref 3.5–5.1)
Sodium: 129 mmol/L — ABNORMAL LOW (ref 135–145)

## 2020-07-19 LAB — CORTISOL: Cortisol, Plasma: 28.5 ug/dL

## 2020-07-19 LAB — CHOLESTEROL, TOTAL: Cholesterol: 171 mg/dL (ref 0–200)

## 2020-07-19 LAB — TSH: TSH: 2.438 u[IU]/mL (ref 0.350–4.500)

## 2020-07-19 MED ORDER — ALUM & MAG HYDROXIDE-SIMETH 200-200-20 MG/5ML PO SUSP
30.0000 mL | Freq: Four times a day (QID) | ORAL | Status: DC | PRN
Start: 2020-07-19 — End: 2020-07-23

## 2020-07-19 MED ORDER — SODIUM CHLORIDE 0.9 % IV SOLN: INTRAVENOUS | Status: DC

## 2020-07-19 MED ORDER — ALBUMIN HUMAN 25 % IV SOLN
25.0000 g | Freq: Three times a day (TID) | INTRAVENOUS | Status: DC
  Administered 2020-07-19 – 2020-07-22 (×8): 25 g via INTRAVENOUS
  Filled 2020-07-19 (×10): qty 100

## 2020-07-19 MED ORDER — SODIUM CHLORIDE 0.9 % IV BOLUS
500.0000 mL | INTRAVENOUS | Status: AC
  Administered 2020-07-19: 500 mL via INTRAVENOUS

## 2020-07-19 MED ORDER — SENNOSIDES-DOCUSATE SODIUM 8.6-50 MG PO TABS
2.0000 | ORAL_TABLET | Freq: Two times a day (BID) | ORAL | Status: DC
  Administered 2020-07-19 – 2020-07-21 (×2): 2 via ORAL
  Filled 2020-07-19 (×4): qty 2

## 2020-07-19 MED ORDER — BISACODYL 5 MG PO TBEC
10.0000 mg | DELAYED_RELEASE_TABLET | Freq: Every day | ORAL | Status: DC
  Administered 2020-07-19: 10 mg via ORAL
  Filled 2020-07-19 (×2): qty 2

## 2020-07-19 MED ORDER — CLONAZEPAM 0.25 MG PO TBDP
0.5000 mg | ORAL_TABLET | Freq: Four times a day (QID) | ORAL | Status: DC
  Administered 2020-07-19 – 2020-07-22 (×11): 0.5 mg via ORAL
  Filled 2020-07-19 (×11): qty 2

## 2020-07-19 MED ORDER — IOHEXOL 300 MG/ML  SOLN
100.0000 mL | Freq: Once | INTRAMUSCULAR | Status: AC | PRN
  Administered 2020-07-19: 100 mL via INTRAVENOUS

## 2020-07-19 MED ORDER — SODIUM CHLORIDE 0.9 % IV BOLUS: 500.0000 mL | INTRAVENOUS | Status: DC

## 2020-07-19 MED ORDER — POLYETHYLENE GLYCOL 3350 17 G PO PACK
17.0000 g | PACK | Freq: Every day | ORAL | Status: DC
  Administered 2020-07-19: 17 g via ORAL
  Filled 2020-07-19 (×2): qty 1

## 2020-07-19 MED ORDER — ENSURE MAX PROTEIN PO LIQD
11.0000 [oz_av] | Freq: Two times a day (BID) | ORAL | Status: DC
  Administered 2020-07-19: 11 [oz_av] via ORAL

## 2020-07-19 MED ORDER — ENSURE ENLIVE PO LIQD
237.0000 mL | Freq: Three times a day (TID) | ORAL | Status: DC
  Administered 2020-07-19 – 2020-07-21 (×6): 237 mL via ORAL

## 2020-07-19 NOTE — Progress Notes (Signed)
Patient refused CPAP for the night.  Patient wants to continue to wear oxygen for the night.  Nurse aware of patient's refusal.

## 2020-07-19 NOTE — Progress Notes (Signed)
OT Cancellation Note  Patient Details Name: Seth Haley MRN: [id number] DOB: [birth year]   Cancelled Treatment:    Reason Eval/Treat Not Completed: Medical issues which prohibited therapy. Order received, chart reviewed. Pt to undergo EVAR surgery for rapidly growing urgent abdominal aortic aneurysm. Will hold OT evaluation at present and see pt post surgery, as he is medically appropriate.  Latina Craver, PhD, MS, OTR/L 07/19/20, 12:16 PM

## 2020-07-19 NOTE — Progress Notes (Signed)
Patient ID: Seth Haley, male   DOB: 12/06/1955, 65 y.o.   MRN: [id number] Returned to assess patient due to a drop in BP.  He seems to be resting with mild abdominal discomfort.  He has been trying to have a BP.  Bolus of NS 500 going in now.  Nurse stated that urine was dark amber.  I will order plain abdominal CT scan to assess abdomen.

## 2020-07-19 NOTE — Progress Notes (Addendum)
1       [place] at [facility]   PATIENT NAME: Seth Haley    MR#:  [id number]  DATE OF BIRTH:  July 12, 1955  SUBJECTIVE:  CHIEF COMPLAINT:   Chief Complaint  Patient presents with   Shortness of Breath  Requesting to schedule his Klonopin 4 times a day as it is helping him but still feels anxious.  Reports constipation and gas symptoms.  Daughter at bedside requesting PT and OT eval for possible rehab placement at discharge.  Blood pressure running low REVIEW OF SYSTEMS:  Review of Systems  Constitutional:  Positive for malaise/fatigue. Negative for diaphoresis, fever and weight loss.  HENT:  Negative for ear discharge, ear pain, hearing loss, nosebleeds, sore throat and tinnitus.   Eyes:  Negative for blurred vision and pain.  Respiratory:  Positive for shortness of breath. Negative for cough, hemoptysis and wheezing.   Cardiovascular:  Negative for chest pain, palpitations, orthopnea and leg swelling.  Gastrointestinal:  Positive for constipation. Negative for abdominal pain, blood in stool, diarrhea, heartburn, nausea and vomiting.  Genitourinary:  Negative for dysuria, frequency and urgency.  Musculoskeletal:  Negative for back pain and myalgias.  Skin:  Negative for itching and rash.  Neurological:  Negative for dizziness, tingling, tremors, focal weakness, seizures, weakness and headaches.  Psychiatric/Behavioral:  Negative for depression. The patient is nervous/anxious and has insomnia.   DRUG ALLERGIES:   Allergies  Allergen Reactions   Sulfa Antibiotics Other (See Comments)   VITALS:  Blood pressure (!) 87/67, pulse 77, temperature (!) 97.4 F (36.3 C), temperature source Oral, resp. rate 18, height 5\' 6"  (1.676 m), weight 127 kg, SpO2 99 %. PHYSICAL EXAMINATION:  Physical Exam 65 year old male lying in the bed comfortably without any acute distress Eyes pupils equal round reactive light accommodation Lungs decreased breath sound the bases.  No wheezing,  rales or crepitation Cardiovascular S1-S2 normal, no murmur rales or gallop Abdomen firm, minimally distended, hypoactive bowel sounds Neuro alert and oriented, nonfocal exam Skin no rash or lesion LABORATORY PANEL:  Male CBC Recent Labs  Lab 07/19/20 0511  WBC 13.3*  HGB 14.7  HCT 43.5  PLT 355    ------------------------------------------------------------------------------------------------------------------ Chemistries  Recent Labs  Lab 07/17/20 1004 07/18/20 0455 07/19/20 0511  NA 127*   < > 129*  K 3.5   < > 4.2  CL 77*   < > 77*  CO2 32   < > 39*  GLUCOSE 143*   < > 135*  BUN 47*   < > 45*  CREATININE 1.32*   < > 1.52*  CALCIUM 9.2   < > 8.8*  MG 2.4  --   --   AST 111*  --   --   ALT 259*  --   --   ALKPHOS 111  --   --   BILITOT 2.6*  --   --    < > = values in this interval not displayed.    RADIOLOGY:  No results found. ASSESSMENT AND PLAN:  65 y.o. 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  Patient was recently hospitalized about a month ago and had paracentesis done at the time.  Ascites Patient has a history of liver cirrhosis and admitted  for evaluation of abdominal distention associated with pain.  He denies having any fever or chills As fluid culture negative we'll stop antibiotics. IR guided paracentesis on 6/10 with removal of 8 L of peritoneal fluid.  Fluid cultures pending  Dehydration with hyponatremia AKI Secondary to poor oral intake and concomitant diuretic use.  Also hypotensive Hold diuretics for now Encourage oral fluid intake Avoid hypotension, will stop atenolol Nephrology consult -considering vascular procedure.  May benefit from  albumin infusion  Constipation/gas and bloating symptoms Will add bowel regimen as patient reports having no bowel movement for last 1 week Maalox as needed  Insomnia/anxiety Increase Klonopin 0.5 mg p.o. 4 times daily and continue trazodone at night for sleep   Abdominal aortic aneurysm without rupture Incidental finding with CT abdomen showing aneurysm measuring 5.7 cm in diameter Appreciate vascular surgery input.  They are planning for EVAR surgery for rapidly growing urgent AAA.  On Wednesday 6/15 -pulmonary and cardiac clearance have been obtained and is cleared Patient is medically clear for planned procedure   COPD with chronic respiratory failure Not acutely exacerbated Worsening shortness of breath appears to be secondary to massive ascites compressing on the diaphragm. Continue as needed bronchodilator therapy Continue home oxygen at 2 L to maintain pulse oximetry greater than 92%   History of liver cirrhosis with portal hypertension and splenomegaly Maintain low-sodium diet Hold diuretics for now due to significant dehydration and electrolyte abnormalities Continue lactulose Continue low-dose beta-blocker   Morbid obesity (BMI 45.19) Complicates overall prognosis. Lifestyle modification and exercise has been discussed with patient   Transaminitis Likely due to worsening liver cirrhosis Monitor liver enzymes  Weakness Daughter is requesting SNF placement.  We will consult PT, OT    Body mass index is 45.19 kg/m.  Net IO Since Admission: -1,300 mL [07/19/20 1246]      Status is: Inpatient  Remains inpatient appropriate because:Ongoing diagnostic testing needed not appropriate for outpatient work up planned vascular surgery on 6/15  Dispo: The patient is from: Home              Anticipated d/c is to: Home              Patient currently is not medically stable to d/c.   Difficult to place patient No    DVT prophylaxis:       SCDs Start: 07/17/20  1441     Family Communication: Updated Sister at bedside on 6/12   All the records are reviewed and case discussed with Care Management/Social Worker. Management plans discussed with the patient, family and they are in agreement.  CODE STATUS: Full Code Level of care: Med-Surg  TOTAL TIME TAKING CARE OF THIS PATIENT: 25 minutes.   More than 50% of the time was spent in counseling/coordination of care: YES  POSSIBLE D/C IN 7-8 DAYS, DEPENDING ON CLINICAL CONDITION.  And vascular surgery procedure on 6/15   Delfino Lovett M.D on 07/19/2020 at 12:46 PM  [facility]   CC: Primary care physician; Simonne Martinet, NP  Note: This dictation was prepared with Dragon dictation along with smaller phrase technology. Any transcriptional errors that result from this process are unintentional.

## 2020-07-19 NOTE — Consult Note (Signed)
[facility] CONSULT NOTE    Date: 07/19/2020                  Patient Name:  Seth Haley  MRN: [id number]  DOB: 07-20-1955  Age / Sex: 65 y.o., male         PCP: Erick Colace, NP                 Service Requesting Consult: Hospitalist                 Reason for Consult: AKI, Hyponatremia            History of Present Illness: Patient is a 65 y.o. male with a PMHx of  hypertension, COPD with chronic respiratory failure on 2 L of oxygen continuous, history of liver cirrhosis with portal hypertension and splenomegaly, who was admitted to [facility] on 07/17/2020 for evaluation of SOB, increased abdominal distension and lower abdominal pain.    Medications: Outpatient medications: Medications Prior to Admission  Medication Sig Dispense Refill Last Dose   albuterol (PROVENTIL) (2.5 MG/3ML) 0.083% nebulizer solution Inhale 3 mLs into the lungs every 6 (six) hours as needed.   07/17/2020   atenolol (TENORMIN) 25 MG tablet Take 25 mg by mouth 2 (two) times daily.   07/16/2020 at 1000   furosemide (LASIX) 20 MG tablet Take 3 tablets (60 mg total) by mouth daily. 90 tablet 1 07/16/2020 at 1000   SPIRIVA HANDIHALER 18 MCG inhalation capsule Place 1 capsule into inhaler and inhale daily.   07/16/2020 at 1000   spironolactone (ALDACTONE) 50 MG tablet Take 3 tablets (150 mg total) by mouth daily. 90 tablet 1 07/16/2020 at 1000   lactulose (CHRONULAC) 10 GM/15ML solution Take 15 mLs (10 g total) by mouth 2 (two) times daily. (Patient not taking: No sig reported) 900 mL 0 Not Taking   ondansetron (ZOFRAN-ODT) 8 MG disintegrating tablet Take 8 mg by mouth 3 (three) times daily.   unknown at prn   PROAIR HFA 108 (90 Base) MCG/ACT inhaler Inhale 2 puffs into the lungs every 6 (six) hours as needed for wheezing.   prn at prn   prochlorperazine (COMPAZINE) 5 MG tablet Take 1 tablet (5 mg total) by mouth every 6 (six) hours as needed for refractory nausea / vomiting. (Patient not  taking: No sig reported) 20 tablet 0 Not Taking    Current medications: Current Facility-Administered Medications  Medication Dose Route Frequency Provider Last Rate Last Admin   0.9 %  sodium chloride infusion  250 mL Intravenous PRN Agbata, Tochukwu, MD       0.9 %  sodium chloride infusion   Intravenous Continuous Lateef, Munsoor, MD 50 mL/hr at 07/19/20 1244 New Bag at 07/19/20 1244   albumin human 25 % solution 25 g  25 g Intravenous Q8H Lateef, Munsoor, MD 60 mL/hr at 07/19/20 1530 Infusion Verify at 07/19/20 1530   albuterol (PROVENTIL) (2.5 MG/3ML) 0.083% nebulizer solution 3 mL  3 mL Inhalation Q6H PRN Agbata, Tochukwu, MD   3 mL at 07/19/20 0211   alum & mag hydroxide-simeth (MAALOX/MYLANTA) 200-200-20 MG/5ML suspension 30 mL  30 mL Oral Q6H PRN Max Sane, MD       bisacodyl (DULCOLAX) EC tablet 10 mg  10 mg Oral Daily Max Sane, MD   10 mg at 07/19/20 0936   clonazePAM (KLONOPIN) disintegrating tablet 0.5 mg  0.5 mg Oral QID Max Sane, MD   0.5 mg at 07/19/20 [phone]  feeding supplement (ENSURE ENLIVE / ENSURE PLUS) liquid 237 mL  237 mL Oral TID BM Manuella Ghazi, Vipul, MD   237 mL at 07/19/20 1337   lactulose (CHRONULAC) 10 GM/15ML solution 10 g  10 g Oral BID Agbata, Tochukwu, MD   10 g at 07/19/20 0937   ondansetron (ZOFRAN) tablet 4 mg  4 mg Oral Q6H PRN Agbata, Tochukwu, MD       Or   ondansetron (ZOFRAN) injection 4 mg  4 mg Intravenous Q6H PRN Agbata, Tochukwu, MD   4 mg at 07/18/20 2329   polyethylene glycol (MIRALAX / GLYCOLAX) packet 17 g  17 g Oral Daily Max Sane, MD   17 g at 07/19/20 0936   protein supplement (ENSURE MAX) liquid  11 oz Oral BID Max Sane, MD       senna-docusate (Senokot-S) tablet 2 tablet  2 tablet Oral BID Max Sane, MD   2 tablet at 07/19/20 0937   sodium chloride flush (NS) 0.9 % injection 3 mL  3 mL Intravenous Q12H Agbata, Tochukwu, MD   3 mL at 07/19/20 0937   sodium chloride flush (NS) 0.9 % injection 3 mL  3 mL Intravenous PRN Agbata,  Tochukwu, MD       tiotropium (SPIRIVA) inhalation capsule ([facility] use ONLY) 18 mcg  1 capsule Inhalation Daily Agbata, Tochukwu, MD   18 mcg at 07/19/20 0937   traZODone (DESYREL) tablet 50 mg  50 mg Oral QHS Max Sane, MD   50 mg at 07/18/20 2341      Allergies: Allergies  Allergen Reactions   Sulfa Antibiotics Other (See Comments)      Past Medical History: Past Medical History:  Diagnosis Date   COPD (chronic obstructive pulmonary disease) ([place])    Hypertension    Umbilical hernia      Past Surgical History: History reviewed. No pertinent surgical history.   Family History: Family History  Problem Relation Age of Onset   Hypertension Mother      Social History: Social History   Socioeconomic History   Marital status: Single    Spouse name: Not on file   Number of children: Not on file   Years of education: Not on file   Highest education level: Not on file  Occupational History   Not on file  Tobacco Use   Smoking status: Former    Pack years: 0.00   Smokeless tobacco: Never  Substance and Sexual Activity   Alcohol use: Not Currently   Drug use: Not Currently   Sexual activity: Not on file  Other Topics Concern   Not on file  Social History Narrative   Not on file   Social Determinants of Health   Financial Resource Strain: Not on file  Food Insecurity: Not on file  Transportation Needs: Not on file  Physical Activity: Not on file  Stress: Not on file  Social Connections: Not on file  Intimate Partner Violence: Not on file     Review of Systems: Unable to collect much data from the patient as he appears drowsy,falls asleep while talking  Vital Signs: Blood pressure 115/82, pulse 67, temperature (!) 97.4 F (36.3 C), temperature source Oral, resp. rate 18, height _0  (1.676 m), weight 127 kg, SpO2 99 %.  Weight trends: Filed Weights   07/17/20 0953  Weight: 127 kg    Physical Exam: Physical Exam: General:  Appears drowsy,  arousable to call  Head:  Normocephalic, atraumatic. Dry oral mucosal membranes  Neck:  Supple  Lungs:   Respirations slightly labored, on 2L supplemental O2 via [place], Lungs diminished at the bases, expiratory wheezes +  Heart:  S1S2 no rubs or gallops  Abdomen:   Distended, no tenderness  Extremities:  Trace peripheral edema   Neurologic: Drowsy, opens eyes to call  Skin:  No acute lesions or rashes    Lab results: Basic Metabolic Panel: Recent Labs  Lab 07/17/20 1004 07/18/20 0455 07/19/20 0511  NA 127* 129* 129*  K 3.5 3.9 4.2  CL 77* 77* 77*  CO2 32 37* 39*  GLUCOSE 143* 140* 135*  BUN 47* 47* 45*  CREATININE 1.32* 1.40* 1.52*  CALCIUM 9.2 9.1 8.8*  MG 2.4  --   --     Liver Function Tests: Recent Labs  Lab 07/17/20 1004  AST 111*  ALT 259*  ALKPHOS 111  BILITOT 2.6*  PROT 7.3  ALBUMIN 3.2*   Recent Labs  Lab 07/17/20 1004  LIPASE 24   Recent Labs  Lab 07/17/20 1142  AMMONIA 11    CBC: Recent Labs  Lab 07/17/20 1004 07/18/20 0455 07/19/20 0511  WBC 15.0* 16.2* 13.3*  NEUTROABS 13.2*  --   --   HGB 15.0 16.1 14.7  HCT 44.0 47.7 43.5  MCV 83.8 84.9 86.5  PLT 454* 431* 355    Cardiac Enzymes: No results for input(s): CKTOTAL, CKMB, CKMBINDEX, TROPONINI in the last 168 hours.  BNP: Invalid input(s): POCBNP  CBG: No results for input(s): GLUCAP in the last 168 hours.  Microbiology: Results for orders placed or performed during the hospital encounter of 07/17/20  Resp Panel by RT-PCR (Flu A&B, Covid) Nasopharyngeal Swab     Status: None   Collection Time: 07/17/20 10:04 AM   Specimen: Nasopharyngeal Swab; Nasopharyngeal(NP) swabs in vial transport medium  Result Value Ref Range Status   SARS Coronavirus 2 by RT PCR NEGATIVE NEGATIVE Final    Comment: (NOTE) SARS-CoV-2 target nucleic acids are NOT DETECTED.  The SARS-CoV-2 RNA is generally detectable in upper respiratory specimens during the acute phase of infection. The  lowest concentration of SARS-CoV-2 viral copies this assay can detect is 138 copies/mL. A negative result does not preclude SARS-Cov-2 infection and should not be used as the sole basis for treatment or other patient management decisions. A negative result may occur with  improper specimen collection/handling, submission of specimen other than nasopharyngeal swab, presence of viral mutation(s) within the areas targeted by this assay, and inadequate number of viral copies(<138 copies/mL). A negative result must be combined with clinical observations, patient history, and epidemiological information. The expected result is Negative.  Fact Sheet for Patients:  EntrepreneurPulse.com.au  Fact Sheet for Healthcare Providers:  IncredibleEmployment.be  This test is no t yet approved or cleared by the [place] FDA and  has been authorized for detection and/or diagnosis of SARS-CoV-2 by FDA under an Emergency Use Authorization (EUA). This EUA will remain  in effect (meaning this test can be used) for the duration of the COVID-19 declaration under Section 564(b)(1) of the Act, 21 U.S.C.section 360bbb-3(b)(1), unless the authorization is terminated  or revoked sooner.       Influenza A by PCR NEGATIVE NEGATIVE Final   Influenza B by PCR NEGATIVE NEGATIVE Final    Comment: (NOTE) The Xpert Xpress SARS-CoV-2/FLU/RSV plus assay is intended as an aid in the diagnosis of influenza from Nasopharyngeal swab specimens and should not be used as a sole basis for treatment. Nasal washings and aspirates are unacceptable for  Xpert Xpress SARS-CoV-2/FLU/RSV testing.  Fact Sheet for Patients: EntrepreneurPulse.com.au  Fact Sheet for Healthcare Providers: IncredibleEmployment.be  This test is not yet approved or cleared by the [place] FDA and has been authorized for detection and/or diagnosis of SARS-CoV-2 by FDA under  an Emergency Use Authorization (EUA). This EUA will remain in effect (meaning this test can be used) for the duration of the COVID-19 declaration under Section 564(b)(1) of the Act, 21 U.S.C. section 360bbb-3(b)(1), unless the authorization is terminated or revoked.  Performed at [facility], [address] [place], [place] [address]   Blood culture (routine x 2)     Status: None (Preliminary result)   Collection Time: 07/17/20  1:53 PM   Specimen: BLOOD  Result Value Ref Range Status   Specimen Description BLOOD RIGHT ANTECUBITAL  Final   Special Requests   Final    BOTTLES DRAWN AEROBIC AND ANAEROBIC Blood Culture adequate volume   Culture   Final    NO GROWTH < 24 HOURS Performed at [facility], [address], [place], [place] [address]    Report Status PENDING  Incomplete  Blood culture (routine x 2)     Status: None (Preliminary result)   Collection Time: 07/17/20  1:53 PM   Specimen: BLOOD  Result Value Ref Range Status   Specimen Description BLOOD BLOOD RIGHT FOREARM  Final   Special Requests   Final    BOTTLES DRAWN AEROBIC AND ANAEROBIC Blood Culture adequate volume   Culture   Final    NO GROWTH < 24 HOURS Performed at [facility], [address] [place], [place] [address]    Report Status PENDING  Incomplete  Body fluid culture w Gram Stain     Status: None (Preliminary result)   Collection Time: 07/17/20  4:30 PM   Specimen: PATH Cytology Peritoneal fluid  Result Value Ref Range Status   Specimen Description   Final    PERITONEAL Performed at [facility], [address] [place], [place] [address]    Special Requests   Final    NONE Performed at [facility], [address] [place], [place] [address]    Gram Stain NO WBC SEEN NO ORGANISMS SEEN   Final   Culture   Final    NO GROWTH 1 DAY Performed at [facility], [place] [address] [place], [place] [address]    Report Status PENDING   Incomplete    Coagulation Studies: Recent Labs    07/17/20 1004  LABPROT 14.4  INR 1.1    Urinalysis: Recent Labs    07/18/20 1715  COLORURINE AMBER*  LABSPEC 1.018  PHURINE 5.0  GLUCOSEU NEGATIVE  HGBUR NEGATIVE  BILIRUBINUR NEGATIVE  KETONESUR NEGATIVE  PROTEINUR NEGATIVE  NITRITE NEGATIVE  LEUKOCYTESUR NEGATIVE      Imaging: US Paracentesis  Result Date: 07/17/2020 INDICATION: Recurrent ascites EXAM: ULTRASOUND GUIDED  PARACENTESIS MEDICATIONS: None. COMPLICATIONS: None immediate. PROCEDURE: Informed written consent was obtained from the patient after a discussion of the risks, benefits and alternatives to treatment. A timeout was performed prior to the initiation of the procedure. Initial ultrasound scanning demonstrates a large amount of ascites within the right lower abdominal quadrant. The right lower abdomen was prepped and draped in the usual sterile fashion. 1% lidocaine was used for local anesthesia. Following this, a 6 Fr Safe-T-Centesis catheter was introduced. An ultrasound image was saved for documentation purposes. The paracentesis was performed. The catheter was removed and a dressing was  applied. The patient tolerated the procedure well without immediate post procedural complication. FINDINGS: A total of approximately 8 L of straw-colored clear fluid was removed. Samples were sent to the laboratory as requested by the clinical team. IMPRESSION: Successful ultrasound-guided paracentesis yielding 8 L liters of peritoneal fluid. Electronically Signed   By: Miachel Roux M.D.   On: 07/17/2020 17:14     Assessment & Plan: Pt is a 65 y.o. male with a PMHX of hypertension, COPD with chronic respiratory failure on 2 L of oxygen continuous, history of liver cirrhosis with portal hypertension and splenomegaly, was admitted to [facility] on 07/17/2020 with SOB, increased abdominal distension and lower abdominal pain.   #Acute Kidney Injury Baseline Cr 0.9 and eGFR >60 on  06/05/2020 Lab Results  Component Value Date   CREATININE 1.52 (H) 07/19/2020   CREATININE 1.40 (H) 07/18/2020   CREATININE 1.32 (H) 07/17/2020   Possibly from dehydration and diuretic use Diuretics on hold currently Will order [place] to evaluate for obstruction Will start IVF at slow rate, 50 ml/hr  #Hyponatremia Sodium 129 today Possibly hypovolemic Will continue monitoring closely  #Abdominal Pain #Liver Cirrhosis with Portal hypertension # AAA  Patient received abdominal paracentesis about a month ago Abdomen still distended, non tender General surgery, vascular team involved in the care Awaiting endovascular AAA repair

## 2020-07-19 NOTE — Progress Notes (Signed)
PT Cancellation Note  Patient Details Name: Seth Haley MRN: [id number] DOB: 10/22/1955   Cancelled Treatment:    Reason Eval/Treat Not Completed: Medical issues which prohibited therapy. PT orders received, and pt chart reviewed. Per most recent MD note, pt cleared to undergo EVAR surgery for rapidly growing urgent abdominal aortic aneurysm. Will hold PT evaluation and follow up as appropriate.  Vira Blanco, PT, DPT 1:23 PM,07/19/20

## 2020-07-19 NOTE — Consult Note (Signed)
Pulmonary and Critical Care          Date: 07/19/2020,   MRN# [id number] [facility] 1955/08/26     AdmissionWeight: 127 kg                 CurrentWeight: 127 kg   Referring physician: Dr Docia BarrierAntezana   CHIEF COMPLAINT:   Pulmonary preoperative evaluation for vascular surgery.   HISTORY OF PRESENT ILLNESS   This is a pleasant 65 year old male with a history of COPD, dyslipidemia and previous umbilical hernia, he has chronic hypoxemia and uses supplemental oxygen at home at 2 L/min nasal cannula, also has significant history with liver cirrhosis and portal hypertension as well as splenomegaly and actually came in on this admission with abdominal discomfort swelling and distention.  Associated with abdominal discomfort is worsening shortness of breath.  In the emergency room family had shared that patient has been declining clinically and has become physically deconditioned with severe fatigue and malaise.  His lab work is significant for severe hyponatremia in the 120s, renal failure with creatinine 1.3 and transaminitis.  Liver synthetic function including INR and albumin are within reference range.  Patient had CT of the abdomen performed which showed large volume ascites, right pleural effusion and compressive atelectasis, diverticulosis periumbilical hernia containing small bowel, and a significant 5.7 cm infrarenal AAA which was previously 5.1 cm.  Pulmonary consultation placed for additional evaluation and risk stratification for vascular surgery AAA repair. Sister at bedside we rewviewed care plan together with patient.  [facility] with Dr Meredeth IdeFleming.    PAST MEDICAL HISTORY   Past Medical History:  Diagnosis Date   COPD (chronic obstructive pulmonary disease) (HCC)    Hypertension    Umbilical hernia      SURGICAL HISTORY   History reviewed. No pertinent surgical history.   FAMILY HISTORY   Family History  Problem Relation Age  of Onset   Hypertension Mother      SOCIAL HISTORY   Social History   Tobacco Use   Smoking status: Former    Pack years: 0.00   Smokeless tobacco: Never  Substance Use Topics   Alcohol use: Not Currently   Drug use: Not Currently     MEDICATIONS    Home Medication:    Current Medication:  Current Facility-Administered Medications:    0.9 %  sodium chloride infusion, 250 mL, Intravenous, PRN, Agbata, Tochukwu, MD   albuterol (PROVENTIL) (2.5 MG/3ML) 0.083% nebulizer solution 3 mL, 3 mL, Inhalation, Q6H PRN, Agbata, Tochukwu, MD, 3 mL at 07/19/20 0211   alum & mag hydroxide-simeth (MAALOX/MYLANTA) 200-200-20 MG/5ML suspension 30 mL, 30 mL, Oral, Q6H PRN, Delfino LovettShah, Vipul, MD   atenolol (TENORMIN) tablet 25 mg, 25 mg, Oral, BID, Agbata, Tochukwu, MD, 25 mg at 07/18/20 2340   bisacodyl (DULCOLAX) EC tablet 10 mg, 10 mg, Oral, Daily, Sherryll BurgerShah, Vipul, MD   cefTRIAXone (ROCEPHIN) 2 g in sodium chloride 0.9 % 100 mL IVPB, 2 g, Intravenous, Q24H, Agbata, Tochukwu, MD, Stopped at 07/18/20 1327   clonazePAM (KLONOPIN) disintegrating tablet 0.5 mg, 0.5 mg, Oral, QID, Sherryll BurgerShah, Vipul, MD   feeding supplement (ENSURE ENLIVE / ENSURE PLUS) liquid 237 mL, 237 mL, Oral, TID BM, Sherryll BurgerShah, Vipul, MD   lactulose (CHRONULAC) 10 GM/15ML solution 10 g, 10 g, Oral, BID, Agbata, Tochukwu, MD, 10 g at 07/18/20 2329   ondansetron (ZOFRAN) tablet 4 mg, 4 mg, Oral, Q6H PRN **OR** ondansetron (ZOFRAN) injection 4 mg, 4 mg, Intravenous,  Q6H PRN, Agbata, Tochukwu, MD, 4 mg at 07/18/20 2329   polyethylene glycol (MIRALAX / GLYCOLAX) packet 17 g, 17 g, Oral, Daily, Sherryll Burger, Vipul, MD   senna-docusate (Senokot-S) tablet 2 tablet, 2 tablet, Oral, BID, Sherryll Burger, Vipul, MD   sodium chloride flush (NS) 0.9 % injection 3 mL, 3 mL, Intravenous, Q12H, Agbata, Tochukwu, MD, 3 mL at 07/18/20 2341   sodium chloride flush (NS) 0.9 % injection 3 mL, 3 mL, Intravenous, PRN, Agbata, Tochukwu, MD   tiotropium (SPIRIVA) inhalation capsule ([facility] use  ONLY) 18 mcg, 1 capsule, Inhalation, Daily, Agbata, Tochukwu, MD, 18 mcg at 07/18/20 1026   traZODone (DESYREL) tablet 50 mg, 50 mg, Oral, QHS, Delfino Lovett, MD, 50 mg at 07/18/20 2341    ALLERGIES   Sulfa antibiotics     REVIEW OF SYSTEMS    Review of Systems:  Gen:  Denies  fever, sweats, chills weigh loss  HEENT: Denies blurred vision, double vision, ear pain, eye pain, hearing loss, nose bleeds, sore throat Cardiac:  No dizziness, chest pain or heaviness, chest tightness,edema Resp:   Denies cough or sputum porduction, shortness of breath,wheezing, hemoptysis,  Gi: Denies swallowing difficulty, stomach pain, nausea or vomiting, diarrhea, constipation, bowel incontinence Gu:  Denies bladder incontinence, burning urine Ext:   Denies Joint pain, stiffness or swelling Skin: Denies  skin rash, easy bruising or bleeding or hives Endoc:  Denies polyuria, polydipsia , polyphagia or weight change Psych:   Denies depression, insomnia or hallucinations   Other:  All other systems negative   VS: BP 100/71 (BP Location: Left Arm)   Pulse 78   Temp (!) 97.4 F (36.3 C) (Oral)   Resp 20   Ht 5\' 6"  (1.676 m)   Wt 127 kg   SpO2 100%   BMI 45.19 kg/m      PHYSICAL EXAM    GENERAL:NAD, no fevers, chills, no weakness no fatigue HEAD: Normocephalic, atraumatic.  EYES: Pupils equal, round, reactive to light. Extraocular muscles intact. No scleral icterus.  MOUTH: Moist mucosal membrane. Dentition intact. No abscess noted.  EAR, NOSE, THROAT: Clear without exudates. No external lesions.  NECK: Supple. No thyromegaly. No nodules. No JVD.  PULMONARY: mild rhonchi bilaterally  CARDIOVASCULAR: S1 and S2. Regular rate and rhythm. No murmurs, rubs, or gallops. No edema. Pedal pulses 2+ bilaterally.  GASTROINTESTINAL: Soft, nontender, nondistended. No masses. Positive bowel sounds. No hepatosplenomegaly.  MUSCULOSKELETAL: No swelling, clubbing, or edema. Range of motion full in all  extremities.  NEUROLOGIC: Cranial nerves II through XII are intact. No gross focal neurological deficits. Sensation intact. Reflexes intact.  SKIN: No ulceration, lesions, rashes, or cyanosis. Skin warm and dry. Turgor intact.  PSYCHIATRIC: Mood, affect within normal limits. The patient is awake, alert and oriented x 3. Insight, judgment intact.       IMAGING    CT ABDOMEN PELVIS WO CONTRAST  Result Date: 07/17/2020 CLINICAL DATA:  Abdominal distension, shortness of breath EXAM: CT ABDOMEN AND PELVIS WITHOUT CONTRAST TECHNIQUE: Multidetector CT imaging of the abdomen and pelvis was performed following the standard protocol without IV contrast. COMPARISON:  CT 05/31/2020 FINDINGS: Lower chest: Small right pleural effusion with associated compressive atelectasis. Left lung bases clear. Heart size is normal. Hepatobiliary: Subtle surface nodularity of the liver suggesting underlying cirrhosis. No focal liver lesion is identified on noncontrast study. Cholelithiasis without evidence to suggest cholecystitis. No biliary dilatation. Pancreas: Unremarkable. No pancreatic ductal dilatation or surrounding inflammatory changes. Spleen: Spleen is upper limits of normal in size. Adrenals/Urinary Tract:  Unremarkable adrenal glands. Stable upper pole right renal cyst. Tiny punctate calcification within midpole of the left kidney which may represent a vascular calcification or nonobstructing stone. No hydronephrosis. Urinary bladder is decompressed, limiting its evaluation. Stomach/Bowel: Stomach is within normal limits. Appendix not definitively identified. Colonic diverticulosis. No evidence of bowel wall thickening, distention, or inflammatory changes. Moderate volume stool throughout the colon Vascular/Lymphatic: Aortoiliac atherosclerosis with infrarenal abdominal aortic aneurysm measuring 5.7 cm in maximal transverse dimension (series 2, image 51) previously 5.1 cm. No abdominopelvic lymphadenopathy.  Reproductive: Prostate is unremarkable. Other: Large volume ascites. No definite omental or peritoneal masses identified on noncontrast study. No pneumoperitoneum. Paraumbilical hernia containing small bowel without evidence of obstruction. Musculoskeletal: No acute or significant osseous findings. IMPRESSION: 1. Large volume ascites. 2. Subtle surface nodularity of the liver suggesting underlying cirrhosis. 3. Small right pleural effusion with associated compressive atelectasis. 4. Cholelithiasis without evidence to suggest cholecystitis. 5. Colonic diverticulosis without evidence of acute diverticulitis. 6. Paraumbilical hernia containing small bowel without evidence of obstruction. 7. 5.7 cm infrarenal abdominal aortic aneurysm, previously 5.1 cm. Recommend referral to a vascular specialist. This recommendation follows ACR consensus guidelines: White Paper of the ACR Incidental Findings Committee II on Vascular Findings. J Am Coll Radiol 2013; 10:789-794. 8. Aortic atherosclerosis (ICD10-I70.0). Electronically Signed   By: Duanne Guess D.O.   On: 07/17/2020 11:07   US Paracentesis  Result Date: 07/17/2020 INDICATION: Recurrent ascites EXAM: ULTRASOUND GUIDED  PARACENTESIS MEDICATIONS: None. COMPLICATIONS: None immediate. PROCEDURE: Informed written consent was obtained from the patient after a discussion of the risks, benefits and alternatives to treatment. A timeout was performed prior to the initiation of the procedure. Initial ultrasound scanning demonstrates a large amount of ascites within the right lower abdominal quadrant. The right lower abdomen was prepped and draped in the usual sterile fashion. 1% lidocaine was used for local anesthesia. Following this, a 6 Fr Safe-T-Centesis catheter was introduced. An ultrasound image was saved for documentation purposes. The paracentesis was performed. The catheter was removed and a dressing was applied. The patient tolerated the procedure well without  immediate post procedural complication. FINDINGS: A total of approximately 8 L of straw-colored clear fluid was removed. Samples were sent to the laboratory as requested by the clinical team. IMPRESSION: Successful ultrasound-guided paracentesis yielding 8 L liters of peritoneal fluid. Electronically Signed   By: Acquanetta Belling M.D.   On: 07/17/2020 17:14   DG Chest Portable 1 View  Result Date: 07/17/2020 CLINICAL DATA:  Shortness of breath. EXAM: PORTABLE CHEST 1 VIEW COMPARISON:  06/01/2020 FINDINGS: The heart size and mediastinal contours are within normal limits. Similar chronic interstitial coarsening within the lower lung zones. Both lungs are clear. The visualized skeletal structures are unremarkable. IMPRESSION: No active cardiopulmonary abnormalities. Electronically Signed   By: Signa Kell M.D.   On: 07/17/2020 10:42   US ABDOMEN LIMITED RUQ (LIVER/GB)  Result Date: 07/17/2020 CLINICAL DATA:  Transaminitis. EXAM: ULTRASOUND ABDOMEN LIMITED RIGHT UPPER QUADRANT COMPARISON:  CT of same day. FINDINGS: Gallbladder: Cholelithiasis and sludge is noted. No gallbladder wall thickening is noted. No sonographic Murphy's sign is noted. Common bile duct: Diameter: 6 mm which is within normal limits. Liver: No focal lesion identified. Increased echogenicity of hepatic parenchyma is noted with nodular contours suggesting hepatic cirrhosis. Portal vein is patent on color Doppler imaging with normal direction of blood flow towards the liver. Other: Moderate ascites is noted. IMPRESSION: Findings consistent with hepatic cirrhosis. No definite focal sonographic hepatic abnormality is noted. Cholelithiasis and  sludge is noted without definite evidence of cholecystitis. Moderate ascites. Electronically Signed   By: Lupita Raider M.D.   On: 07/17/2020 12:39      ASSESSMENT/PLAN   Pulmonary preoperative evaluation-vascular surgery for abdominal aortic aneurysm 1.ARISCAT - (CANET) -PREOPERATIVE PULMONARY RISK  INDEX IN ADULTS : -13.3%-moderate risk of any pulmonary complication including worsening atelectasis, increased O2 requirement, increased length of stay.  This is due to abdominal location of surgery, patient's age, baseline hypoxemia and chronic lung disease 2.AROZULLAH RESPIRATORY FAILURE INDEX -10.1%-moderate risk of postoperative respiratory failure-this is due to age, history of chronic lung disease, BUN over 30, abdominal aortic aneurysm surgery 3. GUPTA surgical risk calculator for postoperative respiratory failure (PRF)-13.3 moderate risk -due to AAA procedure, and comorbid functional state   Advanced COPD with chronic hypoxemia  -Currently on COPD care path with nebulizer every 6 as needed with albuterol with home Spiriva once daily also empirically being covered with Rocephin -Encourage use of incentive spirometer -PT OT as able  Pleural effusions -Status post paracentesis this should also help reduce effusions, recommend incentive spirometer and consider trial of diuresis however patient is noted with AKI   Bibasilar atelectasis -Patient should be encouraged to maximize physical therapy while inpatient as well as utilize flutter valve and incentive spirometry several times each hour especially postoperatively   Obstructive sleep apnea -Nightly CPAP settings per RT-symptoms tend to be worse postoperatively please encourage patient to comply with CPAP device    Acute kidney injury stage II Contributing to fluid balance impairment and effusion/ascites formation -Consider renal evaluation to optimize function prior to vascular surgery as this may get worse if patient has significant blood loss and transient decreased perfusion   Severe abdominal ascites Status post paracentesis with improvement -Profile suggestive of SBT and is on Rocephin                  -Fluid count with >3k nucleated cells at 7% PMN with elevated LDH     -agree with IV Rocephin   Anxiety NOS and  insomnia Patient on multiple centrally acting medications which may together further hinder respiratory drive in the context of advanced chronic lung disease chronic hypoxemia and obstructive sleep apnea.   Severe hyponatremia-improving Hypervolemic recommend evaluation with nephrology and nonrapid correction -urine /serum osm, total cholesterol, TSH, ACTH, cortisol   Abdominal aortic aneurysm without rupture --Vascular surgery on case-plan for repair undergoing clearance due to significant comorbid status with multiple provider evaluations in progress EVAR surgery for rapidly growing urgent AAA.      Thank you for allowing me to participate in the care of this patient.   Total face to face encounter time for this patient visit was . >50% of the time was  spent in counseling and coordination of care.    Patient/Family are satisfied with care plan and all questions have been answered.  This document was prepared using Dragon voice recognition software and may include unintentional dictation errors.     Vida Rigger, M.D.  Division of Pulmonary & Critical Care Medicine  [facility]

## 2020-07-19 NOTE — Plan of Care (Signed)
Continuing with plan of care. 

## 2020-07-19 NOTE — Progress Notes (Signed)
Seen patient after reviewing CT scan.  Patient has large incarcerated umbilical hernia on CT scan.  He seems to be comfortable, however has umbilical pain.  He has not had a BM, but states passing gas.  On clinical exam, palpable umbilical hernia with bowel is noted.  Mild erythema is noted on the skin, the hernia was reduced with mild/moderate pain.  Patient has positive bowel sounds.  I have placed him NPO until seen by Dr. Algis Downs. Claudine Mouton. BP is normal.

## 2020-07-19 NOTE — Progress Notes (Signed)
Patient's B/P was 87/67 (73) and Dr. Sherryll Burger notified and advised to reach out to nephrology to confirm giving patient a bolus.  Dr. Cherylann Ratel notified and ordered to give Normal Saline bolus that patient is tolerating well.  Last B/P 115/82 (94).

## 2020-07-19 NOTE — Consult Note (Signed)
[facility] Cardiology Consultation Note  Patient ID: Seth Haley, MRN: [id number], DOB/AGE: 1955-03-05 65 y.o. Admit date: 07/17/2020   Date of Consult: 07/19/2020 Primary Physician: Seth Martinet, NP Primary Cardiologist: None  Chief Complaint:  Chief Complaint  Patient presents with   Shortness of Breath   Reason for Consult:  Risk stratification prior to possible vascular surgery  HPI: 65 y.o. male with known severe chronic obstructive pulmonary disease on oxygen with sleep apnea and chronic respiratory shortness of breath as well as ascites secondary to liver abnormalities with possible acute peritonitis.  The patient has had recent drainage for which the patient feels slightly better and has been placed on Rocephin.  He does have a slight pleural effusion likely secondary to the same concern.  Previous echocardiogram has shown that he has a normal LV systolic function and no evidence of valvular heart disease.  Additionally the patient has had no evidence of congestive heart failure or anginal symptoms at this time.  The patient does have an EKG which is normal and had normal sinus rhythm left axis deviation and right bundle branch block.  He has been treated for his ascites with diuretics with some stability.  Hypertension has been treated with minimal doses of atenolol.  Currently there has been no evidence of new onset of congestive heart failure or cardiovascular anginal symptoms requiring further intervention.  The patient is not on lipid management due to liver abnormalities.  He has had a CT scan showing a significant 5.7 cm abdominal and aortic aneurysm which may need further intervention and patient currently is at lowest risk possible for cardiovascular complication with surgical intervention.  Past Medical History:  Diagnosis Date   COPD (chronic obstructive pulmonary disease) (HCC)    Hypertension    Umbilical hernia       Surgical History: History reviewed. No  pertinent surgical history.   Home Meds: Prior to Admission medications   Medication Sig Start Date End Date Taking? Authorizing Provider  albuterol (PROVENTIL) (2.5 MG/3ML) 0.083% nebulizer solution Inhale 3 mLs into the lungs every 6 (six) hours as needed. 10/02/17  Yes [provider]  atenolol (TENORMIN) 25 MG tablet Take 25 mg by mouth 2 (two) times daily. 03/23/20  Yes [provider]  furosemide (LASIX) 20 MG tablet Take 3 tablets (60 mg total) by mouth daily. 06/06/20  Yes Tyrone Nine, MD  SPIRIVA HANDIHALER 18 MCG inhalation capsule Place 1 capsule into inhaler and inhale daily. 03/25/20  Yes [provider]  spironolactone (ALDACTONE) 50 MG tablet Take 3 tablets (150 mg total) by mouth daily. 06/05/20  Yes Tyrone Nine, MD  lactulose (CHRONULAC) 10 GM/15ML solution Take 15 mLs (10 g total) by mouth 2 (two) times daily. Patient not taking: No sig reported 06/05/20   Tyrone Nine, MD  ondansetron (ZOFRAN-ODT) 8 MG disintegrating tablet Take 8 mg by mouth 3 (three) times daily. 06/16/20   [provider]  PROAIR HFA 108 (90 Base) MCG/ACT inhaler Inhale 2 puffs into the lungs every 6 (six) hours as needed for wheezing. 03/26/20   [provider]  prochlorperazine (COMPAZINE) 5 MG tablet Take 1 tablet (5 mg total) by mouth every 6 (six) hours as needed for refractory nausea / vomiting. Patient not taking: No sig reported 06/05/20   Tyrone Nine, MD    Inpatient Medications:   atenolol  25 mg Oral BID   lactulose  10 g Oral BID   sodium chloride flush  3 mL Intravenous Q12H   tiotropium  1 capsule Inhalation Daily   traZODone  50 mg Oral QHS    sodium chloride     cefTRIAXone (ROCEPHIN)  IV Stopped (07/18/20 1327)    Allergies:  Allergies  Allergen Reactions   Sulfa Antibiotics Other (See Comments)    Social History   Socioeconomic History   Marital status: Single    Spouse name: Not on file   Number of children: Not on file    Years of education: Not on file   Highest education level: Not on file  Occupational History   Not on file  Tobacco Use   Smoking status: Former    Pack years: 0.00   Smokeless tobacco: Never  Substance and Sexual Activity   Alcohol use: Not Currently   Drug use: Not Currently   Sexual activity: Not on file  Other Topics Concern   Not on file  Social History Narrative   Not on file   Social Determinants of Health   Financial Resource Strain: Not on file  Food Insecurity: Not on file  Transportation Needs: Not on file  Physical Activity: Not on file  Stress: Not on file  Social Connections: Not on file  Intimate Partner Violence: Not on file     Family History  Problem Relation Age of Onset   Hypertension Mother      Review of Systems Positive for abdominal discomfort Negative for: General:  chills, fever, night sweats or weight changes.  Cardiovascular: PND orthopnea syncope dizziness  Dermatological skin lesions rashes Respiratory: Cough congestion Urologic: Frequent urination urination at night and hematuria Abdominal: negative for nausea, vomiting, diarrhea, bright red blood per rectum, melena, or hematemesis Neurologic: negative for visual changes, and/or hearing changes  All other systems reviewed and are otherwise negative except as noted above.  Labs: No results for input(s): CKTOTAL, CKMB, TROPONINI in the last 72 hours. Lab Results  Component Value Date   WBC 13.3 (H) 07/19/2020   HGB 14.7 07/19/2020   HCT 43.5 07/19/2020   MCV 86.5 07/19/2020   PLT 355 07/19/2020    Recent Labs  Lab 07/17/20 1004 07/18/20 0455 07/19/20 0511  NA 127*   < > 129*  K 3.5   < > 4.2  CL 77*   < > 77*  CO2 32   < > 39*  BUN 47*   < > 45*  CREATININE 1.32*   < > 1.52*  CALCIUM 9.2   < > 8.8*  PROT 7.3  --   --   BILITOT 2.6*  --   --   ALKPHOS 111  --   --   ALT 259*  --   --   AST 111*  --   --   GLUCOSE 143*   < > 135*   < > = values in this interval not  displayed.   No results found for: CHOL, HDL, LDLCALC, TRIG No results found for: DDIMER  Radiology/Studies:  CT ABDOMEN PELVIS WO CONTRAST  Result Date: 07/17/2020 CLINICAL DATA:  Abdominal distension, shortness of breath EXAM: CT ABDOMEN AND PELVIS WITHOUT CONTRAST TECHNIQUE: Multidetector CT imaging of the abdomen and pelvis was performed following the standard protocol without IV contrast. COMPARISON:  CT 05/31/2020 FINDINGS: Lower chest: Small right pleural effusion with associated compressive atelectasis. Left lung bases clear. Heart size is normal. Hepatobiliary: Subtle surface nodularity of the liver suggesting underlying cirrhosis. No focal liver lesion is identified on noncontrast study. Cholelithiasis without evidence to  suggest cholecystitis. No biliary dilatation. Pancreas: Unremarkable. No pancreatic ductal dilatation or surrounding inflammatory changes. Spleen: Spleen is upper limits of normal in size. Adrenals/Urinary Tract: Unremarkable adrenal glands. Stable upper pole right renal cyst. Tiny punctate calcification within midpole of the left kidney which may represent a vascular calcification or nonobstructing stone. No hydronephrosis. Urinary bladder is decompressed, limiting its evaluation. Stomach/Bowel: Stomach is within normal limits. Appendix not definitively identified. Colonic diverticulosis. No evidence of bowel wall thickening, distention, or inflammatory changes. Moderate volume stool throughout the colon Vascular/Lymphatic: Aortoiliac atherosclerosis with infrarenal abdominal aortic aneurysm measuring 5.7 cm in maximal transverse dimension (series 2, image 51) previously 5.1 cm. No abdominopelvic lymphadenopathy. Reproductive: Prostate is unremarkable. Other: Large volume ascites. No definite omental or peritoneal masses identified on noncontrast study. No pneumoperitoneum. Paraumbilical hernia containing small bowel without evidence of obstruction. Musculoskeletal: No acute or  significant osseous findings. IMPRESSION: 1. Large volume ascites. 2. Subtle surface nodularity of the liver suggesting underlying cirrhosis. 3. Small right pleural effusion with associated compressive atelectasis. 4. Cholelithiasis without evidence to suggest cholecystitis. 5. Colonic diverticulosis without evidence of acute diverticulitis. 6. Paraumbilical hernia containing small bowel without evidence of obstruction. 7. 5.7 cm infrarenal abdominal aortic aneurysm, previously 5.1 cm. Recommend referral to a vascular specialist. This recommendation follows ACR consensus guidelines: White Paper of the ACR Incidental Findings Committee II on Vascular Findings. J Am Coll Radiol 2013; 10:789-794. 8. Aortic atherosclerosis (ICD10-I70.0). Electronically Signed   By: Duanne Guess D.O.   On: 07/17/2020 11:07   US Paracentesis  Result Date: 07/17/2020 INDICATION: Recurrent ascites EXAM: ULTRASOUND GUIDED  PARACENTESIS MEDICATIONS: None. COMPLICATIONS: None immediate. PROCEDURE: Informed written consent was obtained from the patient after a discussion of the risks, benefits and alternatives to treatment. A timeout was performed prior to the initiation of the procedure. Initial ultrasound scanning demonstrates a large amount of ascites within the right lower abdominal quadrant. The right lower abdomen was prepped and draped in the usual sterile fashion. 1% lidocaine was used for local anesthesia. Following this, a 6 Fr Safe-T-Centesis catheter was introduced. An ultrasound image was saved for documentation purposes. The paracentesis was performed. The catheter was removed and a dressing was applied. The patient tolerated the procedure well without immediate post procedural complication. FINDINGS: A total of approximately 8 L of straw-colored clear fluid was removed. Samples were sent to the laboratory as requested by the clinical team. IMPRESSION: Successful ultrasound-guided paracentesis yielding 8 L liters of  peritoneal fluid. Electronically Signed   By: Acquanetta Belling M.D.   On: 07/17/2020 17:14   DG Chest Portable 1 View  Result Date: 07/17/2020 CLINICAL DATA:  Shortness of breath. EXAM: PORTABLE CHEST 1 VIEW COMPARISON:  06/01/2020 FINDINGS: The heart size and mediastinal contours are within normal limits. Similar chronic interstitial coarsening within the lower lung zones. Both lungs are clear. The visualized skeletal structures are unremarkable. IMPRESSION: No active cardiopulmonary abnormalities. Electronically Signed   By: Signa Kell M.D.   On: 07/17/2020 10:42   US ABDOMEN LIMITED RUQ (LIVER/GB)  Result Date: 07/17/2020 CLINICAL DATA:  Transaminitis. EXAM: ULTRASOUND ABDOMEN LIMITED RIGHT UPPER QUADRANT COMPARISON:  CT of same day. FINDINGS: Gallbladder: Cholelithiasis and sludge is noted. No gallbladder wall thickening is noted. No sonographic Murphy's sign is noted. Common bile duct: Diameter: 6 mm which is within normal limits. Liver: No focal lesion identified. Increased echogenicity of hepatic parenchyma is noted with nodular contours suggesting hepatic cirrhosis. Portal vein is patent on color Doppler imaging with normal direction of  blood flow towards the liver. Other: Moderate ascites is noted. IMPRESSION: Findings consistent with hepatic cirrhosis. No definite focal sonographic hepatic abnormality is noted. Cholelithiasis and sludge is noted without definite evidence of cholecystitis. Moderate ascites. Electronically Signed   By: Lupita RaiderJames  Green Jr M.D.   On: 07/17/2020 12:39    EKG: Normal sinus rhythm left axis deviation and right bundle branch block  Weights: Filed Weights   07/17/20 0953  Weight: 127 kg     Physical Exam: Blood pressure 100/71, pulse 78, temperature (!) 97.4 F (36.3 C), temperature source Oral, resp. rate 20, height 5\' 6"  (1.676 m), weight 127 kg, SpO2 100 %. Body mass index is 45.19 kg/m. General: Well developed, well nourished, in no acute distress. Head  eyes ears nose throat: Normocephalic, atraumatic, sclera non-icteric, no xanthomas, nares are without discharge. No apparent thyromegaly and/or mass  Lungs: Normal respiratory effort.  Diffuse wheezes, no rales, few rhonchi.  Heart: RRR with normal S1 S2. no murmur gallop, no rub, PMI is normal size and placement, carotid upstroke normal without bruit, jugular venous pressure is normal Abdomen: Soft,  tender,  distended with normoactive bowel sounds. No hepatomegaly. No rebound/guarding. No obvious abdominal masses. Abdominal aorta is normal size without bruit Extremities: Trace diffuse edema. no cyanosis, no clubbing, no ulcers  Peripheral : 2+ bilateral upper extremity pulses, 2+ bilateral femoral pulses, 2+ bilateral dorsal pedal pulse Neuro: Alert and oriented. No facial asymmetry. No focal deficit. Moves all extremities spontaneously. Musculoskeletal: Normal muscle tone without kyphosis Psych:  Responds to questions appropriately with a normal affect.    Assessment: 65 year old male with severe chronic obstructive pulmonary disease liver cirrhosis with ascites and peritonitis but no current evidence of acute coronary syndrome myocardial infarction true angina and/or congestive heart failure having significant abdominal aortic aneurysm and stable from the cardiovascular standpoint at lowest risk possible for cardiovascular complication with vascular surgery  Plan: 1.  Proceed to vascular surgery for repair of abdominal aortic aneurysm due to patient having no evidence of current symptoms of congestive heart failure or true angina with a recent normal LV function by echocardiogram 2.  Continue supportive care for peritonitis and/or ascites from liver cirrhosis without restriction 3.  Patient is counseled on discontinuation of tobacco abuse with continuation of supportive care for hypoxia and COPD 4.  No further cardiac diagnostics necessary at this time  Signed, Lamar BlinksBruce J Skeeter Sheard M.D.  [facility] 07/19/2020, 6:19 AM

## 2020-07-19 NOTE — Consult Note (Signed)
[facility] SURGICAL CONSULTATION NOTE (initial) - cpt: 16109: [id number]   HISTORY OF PRESENT ILLNESS (HPI):  65 y.o. male with incarcerated umbilical hernia, and associated SBO as noted on CT today.  Dr. Docia BarrierAntezana was able to reduce it prior to my visit.   Patient reports minimal tenderness during my evaluation.   Surgery is consulted by Hospitalist, physician Dr. Sherryll BurgerShah in this context for evaluation and management of umbilical hernia. Pt with known cirrhosis with ascites with recent therapeutic paracentesis of 8L. Associated recent h/o constipation.    PAST MEDICAL HISTORY (PMH):  Past Medical History:  Diagnosis Date   COPD (chronic obstructive pulmonary disease) (HCC)    Hypertension    Umbilical hernia      PAST SURGICAL HISTORY (PSH):  History reviewed. No pertinent surgical history.   MEDICATIONS:  Prior to Admission medications   Medication Sig Start Date End Date Taking? Authorizing Provider  albuterol (PROVENTIL) (2.5 MG/3ML) 0.083% nebulizer solution Inhale 3 mLs into the lungs every 6 (six) hours as needed. 10/02/17  Yes [provider]  atenolol (TENORMIN) 25 MG tablet Take 25 mg by mouth 2 (two) times daily. 03/23/20  Yes [provider]  furosemide (LASIX) 20 MG tablet Take 3 tablets (60 mg total) by mouth daily. 06/06/20  Yes Tyrone NineGrunz, Ryan B, MD  SPIRIVA HANDIHALER 18 MCG inhalation capsule Place 1 capsule into inhaler and inhale daily. 03/25/20  Yes [provider]  spironolactone (ALDACTONE) 50 MG tablet Take 3 tablets (150 mg total) by mouth daily. 06/05/20  Yes Tyrone NineGrunz, Ryan B, MD  lactulose (CHRONULAC) 10 GM/15ML solution Take 15 mLs (10 g total) by mouth 2 (two) times daily. Patient not taking: No sig reported 06/05/20   Tyrone NineGrunz, Ryan B, MD  ondansetron (ZOFRAN-ODT) 8 MG disintegrating tablet Take 8 mg by mouth 3 (three) times daily. 06/16/20   [provider]  PROAIR HFA 108 (90 Base) MCG/ACT inhaler Inhale 2 puffs into the lungs  every 6 (six) hours as needed for wheezing. 03/26/20   [provider]  prochlorperazine (COMPAZINE) 5 MG tablet Take 1 tablet (5 mg total) by mouth every 6 (six) hours as needed for refractory nausea / vomiting. Patient not taking: No sig reported 06/05/20   Tyrone NineGrunz, Ryan B, MD     ALLERGIES:  Allergies  Allergen Reactions   Sulfa Antibiotics Other (See Comments)     SOCIAL HISTORY:  Social History   Socioeconomic History   Marital status: Single    Spouse name: Not on file   Number of children: Not on file   Years of education: Not on file   Highest education level: Not on file  Occupational History   Not on file  Tobacco Use   Smoking status: Former    Pack years: 0.00   Smokeless tobacco: Never  Substance and Sexual Activity   Alcohol use: Not Currently   Drug use: Not Currently   Sexual activity: Not on file  Other Topics Concern   Not on file  Social History Narrative   Not on file   Social Determinants of Health   Financial Resource Strain: Not on file  Food Insecurity: Not on file  Transportation Needs: Not on file  Physical Activity: Not on file  Stress: Not on file  Social Connections: Not on file  Intimate Partner Violence: Not on file     FAMILY HISTORY:  Family History  Problem Relation Age of Onset   Hypertension Mother      VITAL SIGNS:  Temp:  [97.4 F (36.3 C)-97.8 F (36.6 C)] 97.4 F (36.3 C) (06/12 0457) Pulse Rate:  [67-78] 69 (06/12 1743) Resp:  [16-20] 18 (06/12 1224) BP: (87-115)/(67-83) 104/72 (06/12 1743) SpO2:  [98 %-100 %] 99 % (06/12 1224)     Height: 5\' 6"  (167.6 cm) Weight: 127 kg BMI (Calculated): 45.21   INTAKE/OUTPUT:  06/11 0701 - 06/12 0700 In: 100 [IV Piggyback:100] Out: 800 [Urine:800]  PHYSICAL EXAM:  Physical Exam Blood pressure 104/72, pulse 69, temperature (!) 97.4 F (36.3 C), temperature source Oral, resp. rate 18, height 5\' 6"  (1.676 m), weight 127 kg, SpO2 99 %. Last Weight  Most recent update:  07/17/2020  9:54 AM    Weight  127 kg (280 lb)             CONSTITUTIONAL: Well developed, and nourished, appropriately responsive and aware without distress.   EYES: Sclera non-icteric.   EARS, NOSE, MOUTH AND THROAT: Mask worn.   Hearing is intact to voice.  NECK: Trachea is midline, and there is no jugular venous distension.  LYMPH NODES:  Lymph nodes in the neck are not enlarged. RESPIRATORY:  Lungs are clear, and breath sounds are equal bilaterally. Normal respiratory effort without pathologic use of accessory muscles. CARDIOVASCULAR: Heart is regular in rate and rhythm. GI: The abdomen is well rounded, firm/tense with ascites, distended, reducible umbilical hernia(recently reducedd) o/w soft, and nontender. There were normal bowel sounds. MUSCULOSKELETAL:  Symmetrical muscle tone appreciated in all four extremities.    SKIN: Skin turgor is normal. No pathologic skin lesions appreciated.  NEUROLOGIC:  Motor and sensation appear grossly normal.  Cranial nerves are grossly without defect. PSYCH:  Alert and oriented to person, place and time. Affect is appropriate for situation.  Data Reviewed I have personally reviewed what is currently available of the patient's imaging, recent labs and medical records.    Labs:  CBC Latest Ref Rng & Units 07/19/2020 07/18/2020 07/17/2020  WBC 4.0 - 10.5 K/uL 13.3(H) 16.2(H) 15.0(H)  Hemoglobin 13.0 - 17.0 g/dL 09/17/2020 09/16/2020 54.0  Hematocrit 39.0 - 52.0 % 43.5 47.7 44.0  Platelets 150 - 400 K/uL 355 431(H) 454(H)   CMP Latest Ref Rng & Units 07/19/2020 07/18/2020 07/17/2020  Glucose 70 - 99 mg/dL 09/17/2020) 09/16/2020) 478(G)  BUN 8 - 23 mg/dL 956(O) 130(Q) 65(H)  Creatinine 0.61 - 1.24 mg/dL 84(O) 96(E) 9.52(W)  Sodium 135 - 145 mmol/L 129(L) 129(L) 127(L)  Potassium 3.5 - 5.1 mmol/L 4.2 3.9 3.5  Chloride 98 - 111 mmol/L 77(L) 77(L) 77(L)  CO2 22 - 32 mmol/L 39(H) 37(H) 32  Calcium 8.9 - 10.3 mg/dL 4.13(K) 9.1 9.2  Total Protein 6.5 - 8.1 g/dL - - 7.3   Total Bilirubin 0.3 - 1.2 mg/dL - - 2.6(H)  Alkaline Phos 38 - 126 U/L - - 111  AST 15 - 41 U/L - - 111(H)  ALT 0 - 44 U/L - - 259(H)     Imaging studies:  Radiology review:  Last 24 hrs: CT ABDOMEN PELVIS W CONTRAST  Result Date: 07/19/2020 CLINICAL DATA:  Acute abdominal pain.  Hypotension. EXAM: CT ABDOMEN AND PELVIS WITH CONTRAST TECHNIQUE: Multidetector CT imaging of the abdomen and pelvis was performed using the standard protocol following bolus administration of intravenous contrast. CONTRAST:  0.2(V OMNIPAQUE IOHEXOL 300 MG/ML  SOLN COMPARISON:  05/31/2020 and 07/17/2020 FINDINGS: Lower chest: Small right pleural effusion unchanged to slightly worse. Left lung is clear. Hepatobiliary: Somewhat small nodular liver likely due to cirrhosis. No focal  liver mass. Evidence of cholelithiasis with cholesterol stone noted and unchanged. Biliary tree is unremarkable. Pancreas: Normal. Spleen: Normal. Adrenals/Urinary Tract: Adrenal glands are normal. Kidneys are normal in size without hydronephrosis. Right renal cyst unchanged. Ureters and bladder are normal. Stomach/Bowel: Stomach is unremarkable. There is an umbilical hernia containing a short segment of small bowel which is now incarcerated as there is interval development of dilatation of the small bowel loops immediately proximal to entering the hernia sac measuring 5 cm in diameter. Small bowel distal to the hernia sac is normal. Air is present throughout the colon. There is diverticulosis of the sigmoid colon. Vascular/Lymphatic: Calcified plaque over the abdominal aorta with moderate mural thrombus over the distal abdominal aorta. Again noted is patient's distal abdominal aortic aneurysm measuring 4.8 x 5.7 cm in AP and transverse dimension at the same level as measured previously (previously 4.3 x 5.7 cm). Greatest AP dimension is 5.1 cm which is unchanged. No adenopathy. Reproductive: Normal. Other: Moderate ascites without significant change.  There are a few areas of soft tissue nodularity associated with ascites concerning for possible peritoneal spread of neoplastic disease/malignant ascites. Musculoskeletal: Degenerative change of the spine and hips. IMPRESSION: 1. Umbilical hernia containing a short segment of small bowel which is now incarcerated as there is interval development of small bowel obstruction immediately proximal to the hernia sac measuring 5 cm in diameter. 2. Moderate ascites without significant change. There are a few areas of soft tissue nodularity associated with ascites concerning for possible peritoneal spread of neoplastic disease/malignant ascites. Recommend correlation with known underlying neoplasm. 3. Stable distal abdominal aortic aneurysm measuring 4.8 x 5.7 cm in AP and transverse dimension as measured previously (previously 4.3 x 5.7 cm). Recommend referral to a vascular specialist if not already done. This recommendation follows ACR consensus guidelines: White Paper of the ACR Incidental Findings Committee II on Vascular Findings. J Am Coll Radiol 2013; 10:789-794. 4. Cholelithiasis. 5. Right renal cyst unchanged. 6. Colonic diverticulosis. 7. Aortic atherosclerosis. 8. Somewhat small nodular liver possibly due to cirrhosis. Aortic Atherosclerosis (ICD10-I70.0). These results were called by telephone at the time of interpretation on 07/19/2020 at 5:05 pm to provider Louisa Second , who verbally acknowledged these results. Electronically Signed   By: Elberta Fortis M.D.   On: 07/19/2020 17:03   US RENAL  Result Date: 07/19/2020 CLINICAL DATA:  AK I EXAM: RENAL / URINARY TRACT ULTRASOUND COMPLETE COMPARISON:  July 19, 2020 FINDINGS: Right Kidney: Renal measurements: 12.3 x 5.9 x 5.7 cm = volume: 215 mL. Echogenicity is within normal limits. There is a anechoic mass with posterior acoustic enhancement consistent with a benign cyst which measures 2.9 x 3.3 x 3.0 cm. No hydronephrosis. Left Kidney: Renal measurements:  12.0 x 4.4 x 5.4 cm = volume: 150 mL. Echogenicity is within normal limits. There is an echogenic focus consistent with a nonobstructive nephrolithiasis vs calcification as seen on prior CT. No hydronephrosis. Bladder: Appears normal for degree of bladder distention. Other: Incidental note of small to moderate volume ascites IMPRESSION: 1. No hydronephrosis. 2. Small to moderate volume ascites. Electronically Signed   By: Meda Klinefelter MD   On: 07/19/2020 15:52     Assessment/Plan:  65 y.o. male with umbilical hernia, complicated by pertinent comorbidities including:  Patient Active Problem List   Diagnosis Date Noted   SBP (spontaneous bacterial peritonitis) (HCC) 07/17/2020   Chronic respiratory failure (HCC)    Hyponatremia    Dehydration with hyponatremia    Transaminitis  Abdominal pain 06/01/2020   Ascites 05/31/2020   Essential hypertension 05/31/2020   COPD (chronic obstructive pulmonary disease) (HCC) 05/31/2020   Obesity, Class III, BMI 40-49.9 (morbid obesity) (HCC) 05/31/2020   At risk for obstructive sleep apnea 05/31/2020   Cirrhosis of liver with ascites (HCC) 05/31/2020    - Currently at excess risk for the development of ascitic leak with any trans-abdominal wall intervention. No plans for elective UH repair.     - Would be pro active in controlling ascites, quick reduction when hernia presents and perhaps abdominal binder would be beneficial in the interim.   - Anticipate resolution of SBO with reduction. May resume diet as tolerated.    Thank you for the opportunity to participate in this patient's care. Let us know if we can assist in any other way or his risks in the above have been reduced sufficiently to reconsider.   -- Campbell Lerner, M.D., FACS 07/19/2020, 7:20 PM

## 2020-07-19 NOTE — Progress Notes (Signed)
Initial Nutrition Assessment  DOCUMENTATION CODES:  Morbid obesity  INTERVENTION:  Continue current diet, 2g Na restriction to aid in managing fluid build up. Encourage PO intake Request new measured weight Ensure Max po BID, each supplement provides 150 kcal and 30 grams of protein.   NUTRITION DIAGNOSIS:  Inadequate oral intake related to poor appetite as evidenced by per patient/family report.  GOAL:  Patient will meet greater than or equal to 90% of their needs  MONITOR:  PO intake, Supplement acceptance, I & O's, Weight trends  REASON FOR ASSESSMENT:  Malnutrition Screening Tool, Consult Assessment of nutrition requirement/status  ASSESSMENT:  65 y.o. male  with medical history significant for HTN, COPD, history of tobacco use, history of EtOH use (quit 2 years ago), cirrhosis, obesity on 2 L of oxygen at baseline presented to ED via EMS from home for assessment of increasing SOB, abdominal distention, and generalized abdominal pain over the last couple days.   Pt reported nausea, poor oral intake, and constipation on admission. His sister also stated that he has become increasingly weak  6/10 - Paracentesis, 8 L of straw-colored clear fluid was removed 6/15 - EVAR planned  Attempted to call pt on room phone, no answer at this time. Review chart, limited weight hx available and so far only 1 meal intake recorded. Noted that significant fluid was removed 6/10 during paracentesis, weight fluctuations that pt mentioned in RN admission screen may be related to fluid. Gain noted since last measurement in April. Will request new measured.  Pt to undergo AAA repair on Wednesday per surgery notes. Will add nutrition supplements to encourage adequate protein preoperatively.    Average Meal Intake: 6/10-6/12: 85% intake x 1 recorded meal  Nutritionally Relevant Medications: Scheduled Meds:  bisacodyl  10 mg Oral Daily   lactulose  10 g Oral BID   polyethylene glycol  17 g Oral  Daily   senna-docusate  2 tablet Oral BID   Continuous Infusions:  sodium chloride 50 mL/hr at 07/19/20 1244   PRN Meds: alum & mag hydroxide-simeth, ondansetron   Labs Reviewed: Na 129 . Chloride 77 BUN 45, creatinine 1.52 SBG ranges from 135-140 mg/dL over the last 24 hours HgbA1c 5.4% (4/28)  NUTRITION - FOCUSED PHYSICAL EXAM: Defer to in-person assessment  Diet Order:   Diet Order             Diet 2 gram sodium Room service appropriate? Yes; Fluid consistency: Thin  Diet effective now                   EDUCATION NEEDS:  No education needs have been identified at this time  Skin:  Skin Assessment: Reviewed RN Assessment  Last BM:  PTA  Height:  Ht Readings from Last 1 Encounters:  07/17/20 5\' 6"  (1.676 m)    Weight:  Wt Readings from Last 1 Encounters:  07/17/20 127 kg    Ideal Body Weight:  64.5 kg  BMI:  Body mass index is 45.19 kg/m.  Estimated Nutritional Needs:   Kcal:  2200-2400 kcal/d  Protein:  110-125 g/d  Fluid:  2L/d for fluid buildup  09/16/20, RD, LDN Clinical Dietitian Pager on Amion

## 2020-07-19 NOTE — Progress Notes (Signed)
   Subjective/Chief Complaint: Patient doing well.  Clearance process was started.  Denies any abdominal pain.   Objective: Vital signs in last 24 hours: Temp:  [97.4 F (36.3 C)-97.8 F (36.6 C)] 97.4 F (36.3 C) (06/12 0457) Pulse Rate:  [73-79] 78 (06/12 0457) Resp:  [16-20] 20 (06/12 0457) BP: (99-111)/(71-83) 100/71 (06/12 0457) SpO2:  [98 %-100 %] 100 % (06/12 0457) Last BM Date:  (Pt noted possbly two weeks ago)  Intake/Output from previous day: 06/11 0701 - 06/12 0700 In: 100 [IV Piggyback:100] Out: 800 [Urine:800] Intake/Output this shift: No intake/output data recorded.  General appearance: alert, cooperative, appears older than stated age, and no distress Head: Normocephalic, without obvious abnormality, atraumatic Eyes: conjunctivae/corneas clear. PERRL, EOM's intact. Fundi benign. Back: symmetric, no curvature. ROM normal. No CVA tenderness. Resp: clear to auscultation bilaterally and normal percussion bilaterally GI: soft, non-tender; bowel sounds normal; no masses,  no organomegaly Extremities: extremities normal, atraumatic, no cyanosis or edema Neurologic: Alert and oriented X 3, normal strength and tone. Normal symmetric reflexes. Normal coordination and gait  Lab Results:  Recent Labs    07/18/20 0455 07/19/20 0511  WBC 16.2* 13.3*  HGB 16.1 14.7  HCT 47.7 43.5  PLT 431* 355   BMET Recent Labs    07/18/20 0455 07/19/20 0511  NA 129* 129*  K 3.9 4.2  CL 77* 77*  CO2 37* 39*  GLUCOSE 140* 135*  BUN 47* 45*  CREATININE 1.40* 1.52*  CALCIUM 9.1 8.8*   PT/INR Recent Labs    07/17/20 1004  LABPROT 14.4  INR 1.1   ABG No results for input(s): PHART, HCO3 in the last 72 hours.  Invalid input(s): PCO2, PO2  Studies/Results: US Paracentesis  Result Date: 07/17/2020 INDICATION: Recurrent ascites EXAM: ULTRASOUND GUIDED  PARACENTESIS MEDICATIONS: None. COMPLICATIONS: None immediate. PROCEDURE: Informed written consent was obtained from  the patient after a discussion of the risks, benefits and alternatives to treatment. A timeout was performed prior to the initiation of the procedure. Initial ultrasound scanning demonstrates a large amount of ascites within the right lower abdominal quadrant. The right lower abdomen was prepped and draped in the usual sterile fashion. 1% lidocaine was used for local anesthesia. Following this, a 6 Fr Safe-T-Centesis catheter was introduced. An ultrasound image was saved for documentation purposes. The paracentesis was performed. The catheter was removed and a dressing was applied. The patient tolerated the procedure well without immediate post procedural complication. FINDINGS: A total of approximately 8 L of straw-colored clear fluid was removed. Samples were sent to the laboratory as requested by the clinical team. IMPRESSION: Successful ultrasound-guided paracentesis yielding 8 L liters of peritoneal fluid. Electronically Signed   By: Acquanetta Belling M.D.   On: 07/17/2020 17:14    Anti-infectives: Anti-infectives (From admission, onward)    Start     Dose/Rate Route Frequency Ordered Stop   07/18/20 1000  cefTRIAXone (ROCEPHIN) 2 g in sodium chloride 0.9 % 100 mL IVPB        2 g 200 mL/hr over 30 Minutes Intravenous Every 24 hours 07/18/20 0845     07/17/20 1230  piperacillin-tazobactam (ZOSYN) IVPB 3.375 g        3.375 g 100 mL/hr over 30 Minutes Intravenous  Once 07/17/20 1226 07/17/20 1503       Assessment/Plan: s/p * No surgery found * EVAR for Wed.    LOS: 2 days    Louisa Second 07/19/2020

## 2020-07-20 DIAGNOSIS — K429 Umbilical hernia without obstruction or gangrene: Secondary | ICD-10-CM

## 2020-07-20 DIAGNOSIS — Z7189 Other specified counseling: Secondary | ICD-10-CM

## 2020-07-20 DIAGNOSIS — E871 Hypo-osmolality and hyponatremia: Secondary | ICD-10-CM | POA: Diagnosis not present

## 2020-07-20 DIAGNOSIS — K420 Umbilical hernia with obstruction, without gangrene: Secondary | ICD-10-CM | POA: Diagnosis not present

## 2020-07-20 DIAGNOSIS — J9610 Chronic respiratory failure, unspecified whether with hypoxia or hypercapnia: Secondary | ICD-10-CM | POA: Diagnosis not present

## 2020-07-20 DIAGNOSIS — K652 Spontaneous bacterial peritonitis: Secondary | ICD-10-CM | POA: Diagnosis not present

## 2020-07-20 LAB — CBC
HCT: 43.4 % (ref 39.0–52.0)
Hemoglobin: 14.4 g/dL (ref 13.0–17.0)
MCH: 28.7 pg (ref 26.0–34.0)
MCHC: 33.2 g/dL (ref 30.0–36.0)
MCV: 86.5 fL (ref 80.0–100.0)
Platelets: 325 10*3/uL (ref 150–400)
RBC: 5.02 MIL/uL (ref 4.22–5.81)
RDW: 14.6 % (ref 11.5–15.5)
WBC: 11.3 10*3/uL — ABNORMAL HIGH (ref 4.0–10.5)
nRBC: 0 % (ref 0.0–0.2)

## 2020-07-20 LAB — COMPREHENSIVE METABOLIC PANEL
ALT: 139 U/L — ABNORMAL HIGH (ref 0–44)
AST: 58 U/L — ABNORMAL HIGH (ref 15–41)
Albumin: 3.6 g/dL (ref 3.5–5.0)
Alkaline Phosphatase: 104 U/L (ref 38–126)
Anion gap: 11 (ref 5–15)
BUN: 35 mg/dL — ABNORMAL HIGH (ref 8–23)
CO2: 38 mmol/L — ABNORMAL HIGH (ref 22–32)
Calcium: 8.6 mg/dL — ABNORMAL LOW (ref 8.9–10.3)
Chloride: 80 mmol/L — ABNORMAL LOW (ref 98–111)
Creatinine, Ser: 1.02 mg/dL (ref 0.61–1.24)
GFR, Estimated: >60 mL/min (ref >60)
Glucose, Bld: 111 mg/dL — ABNORMAL HIGH (ref 70–99)
Potassium: 3.7 mmol/L (ref 3.5–5.1)
Sodium: 129 mmol/L — ABNORMAL LOW (ref 135–145)
Total Bilirubin: 1.6 mg/dL — ABNORMAL HIGH (ref 0.3–1.2)
Total Protein: 6.7 g/dL (ref 6.5–8.1)

## 2020-07-20 MED ORDER — COVID-19 MRNA VAC-TRIS(PFIZER) 30 MCG/0.3ML IM SUSP
0.3000 mL | Freq: Once | INTRAMUSCULAR | Status: DC
  Filled 2020-07-20: qty 0.3

## 2020-07-20 NOTE — Evaluation (Signed)
Physical Therapy Evaluation Patient Details Name: Seth Haley MRN: [id number] DOB: [birth year] Today's Date: 07/20/2020   History of Present Illness  Seth Haley is a 65 y.o. 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  Patient was recently hospitalized about a month ago and had paracentesis done at the time.  CT scan noncontrasted of the abdomen and pelvis obtained today to evaluate his ascites shows an AAA that measures 5. 7 cm  Clinical Impression  Patient received up in bathroom upon arrival Sister present in room. Patient has not been getting around much or eating much at home per sister's report. He is getting very weak. Patient is mod independent with transfers and supervision for ambulation of 30 feet. Patient limited by poor endurance and sob with activity. Patient will continue to benefit from skilled PT while here to improve strength and activity tolerance for safe return home.       Follow Up Recommendations SNF    Equipment Recommendations  None recommended by PT    Recommendations for Other Services       Precautions / Restrictions Precautions Precautions: Fall Precaution Comments: mod fall Restrictions Weight Bearing Restrictions: No      Mobility  Bed Mobility Overal bed mobility: Modified Independent             General bed mobility comments: patient up in bathroom upon arrival. Sister in room.    Transfers Overall transfer level: Independent Equipment used: None                Ambulation/Gait Ambulation/Gait assistance: Supervision Gait Distance (Feet): 30 Feet Assistive device: None Gait Pattern/deviations: WFL(Within Functional  Limits) Gait velocity: WNL   General Gait Details: patient is generally safe with ambulation, some concern about O2 line safety, no lob. No ad.  Mobility limited by poor endurance and sob with activity  Stairs            Wheelchair Mobility    Modified Rankin (Stroke Patients Only)       Balance Overall balance assessment: Mild deficits observed, not formally tested                                           Pertinent Vitals/Pain Pain Assessment: No/denies pain    Home Living Family/patient expects to be discharged to:: Private residence Living Arrangements: Alone Available Help at Discharge: Family;Available PRN/intermittently Type of Home: House Home Access: Stairs to enter   Entrance Stairs-Number of Steps: 3 Home Layout: One level Home Equipment: Walker - 2 wheels;Cane - single point Additional Comments: Pt lives in a townhouse. All areas on 1 floor except and additional bedroom and treadmill up another 6 steps per pt report    Prior Function Level of Independence: Independent         Comments: Ind amb without AD household and very limited community distances, Ind with ADLs, no fall history, uses 2LO2 at baseline     Hand Dominance   Dominant Hand: Right    Extremity/Trunk Assessment   Upper Extremity Assessment Upper Extremity Assessment: Defer to OT evaluation    Lower Extremity Assessment Lower Extremity Assessment:  Generalized weakness    Cervical / Trunk Assessment Cervical / Trunk Assessment: Normal  Communication   Communication: No difficulties  Cognition Arousal/Alertness: Awake/alert Behavior During Therapy: WFL for tasks assessed/performed Overall Cognitive Status: Within Functional Limits for tasks assessed                                        General Comments      Exercises     Assessment/Plan    PT Assessment Patient needs continued PT services  PT Problem List Decreased  strength;Decreased mobility;Decreased activity tolerance;Cardiopulmonary status limiting activity       PT Treatment Interventions Therapeutic exercise;Gait training;Stair training;Functional mobility training;Therapeutic activities;Patient/family education    PT Goals (Current goals can be found in the Care Plan section)  Acute Rehab PT Goals Patient Stated Goal: to get some rehab PT Goal Formulation: With family Time For Goal Achievement: 08/03/20 Potential to Achieve Goals: Fair    Frequency Min 2X/week   Barriers to discharge Inaccessible home environment;Decreased caregiver support      Co-evaluation               AM-PAC PT "6 Clicks" Mobility  Outcome Measure Help needed turning from your back to your side while in a flat bed without using bedrails?: None Help needed moving from lying on your back to sitting on the side of a flat bed without using bedrails?: None Help needed moving to and from a bed to a chair (including a wheelchair)?: A Little Help needed standing up from a chair using your arms (e.g., wheelchair or bedside chair)?: A Little Help needed to walk in hospital room?: A Little Help needed climbing 3-5 steps with a railing? : A Little 6 Click Score: 20    End of Session Equipment Utilized During Treatment: Oxygen Activity Tolerance: Patient limited by fatigue;Other (comment) (sob) Patient left: in chair;with call bell/phone within reach;with family/visitor present Nurse Communication: Mobility status PT Visit Diagnosis: Muscle weakness (generalized) (M62.81)    Time: 6294-7654 PT Time Calculation (min) (ACUTE ONLY): 23 min   Charges:   PT Evaluation $PT Eval Moderate Complexity: 1 Mod          Charon Smedberg, PT, GCS 07/20/20,10:44 AM

## 2020-07-20 NOTE — Care Management Important Message (Signed)
Important Message  Patient Details  Name: Seth Haley MRN: [id number] Date of Birth: Jul 29, 1955   Medicare Important Message Given:  Yes     Johnell Comings 07/20/2020, 12:14 PM

## 2020-07-20 NOTE — Evaluation (Signed)
Occupational Therapy Evaluation Patient Details Name: Seth Haley MRN: [id number] DOB: [birth year] Today's Date: 07/20/2020    History of Present Illness Seth Haley is a 65 y.o. 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  Patient was recently hospitalized about a month ago and had paracentesis done at the time.  CT scan noncontrasted of the abdomen and pelvis obtained today to evaluate his ascites shows an AAA that measures 5. 7 cm   Clinical Impression   Patient presenting with decreased I in self care, balance, functional mobility/transfer, endurance, and safety awareness. Patient reports living at home alone PTA without use of AE. Patient currently functioning at min A overall. Pt in bathroom attempting BM when entering the room and pt needed cuing for safety with management of O2 cord. Pt also needing assistance with hygiene for thoroughness. He reports he has not been able to take care of himself or prepare meals because of fatigue level at home. His sister in the room reports significant weight loss. Patient will benefit from acute OT to increase overall independence in the areas of ADLs, functional mobility, and safety awareness in order to safely discharge to next venue of care.     Follow Up Recommendations  SNF;Supervision/Assistance - 24 hour    Equipment Recommendations  3 in 1 bedside commode       Precautions / Restrictions Precautions Precautions: Fall Precaution Comments: mod fall Restrictions Weight Bearing Restrictions: No      Mobility Bed Mobility Overal bed mobility: Modified Independent             General bed mobility comments: Pt in bathroom as  therapist arrived to room    Transfers Overall transfer level: Needs assistance Equipment used: None Transfers: Stand Pivot Transfers;Sit to/from Stand Sit to Stand: Supervision Stand pivot transfers: Supervision       General transfer comment: min cuing for safety awareness    Balance Overall balance assessment: Mild deficits observed, not formally tested                                         ADL either performed or assessed with clinical judgement   ADL Overall ADL's : Needs assistance/impaired                                              Pertinent Vitals/Pain Pain Assessment: No/denies pain     Hand Dominance Right   Extremity/Trunk Assessment Upper Extremity Assessment Upper Extremity Assessment: Generalized weakness   Lower Extremity Assessment Lower Extremity Assessment: Generalized weakness   Cervical / Trunk Assessment Cervical / Trunk Assessment: Normal   Communication Communication Communication: No difficulties   Cognition Arousal/Alertness: Awake/alert Behavior During Therapy: WFL for tasks assessed/performed Overall Cognitive Status: Within Functional Limits for tasks assessed                                 General Comments: Pt needs increased time to process  Home Living Family/patient expects to be discharged to:: Private residence Living Arrangements: Alone Available Help at Discharge: Family;Available PRN/intermittently Type of Home: House Home Access: Stairs to enter [employer] of Steps: 3 Entrance Stairs-Rails: Right;Left;Can reach both Home Layout: One level     Bathroom Shower/Tub: Chief Strategy Officer: Standard     Home Equipment: Environmental consultant - 2 wheels;Cane - single point   Additional Comments: Pt lives in a townhouse. All areas on 1 floor except and additional bedroom and treadmill up another 6 steps per pt report      Prior  Functioning/Environment Level of Independence: Independent        Comments: Ind amb without AD household and very limited community distances, Ind with ADLs, no fall history, uses 2LO2 at baseline        OT Problem List: Decreased strength;Decreased activity tolerance;Cardiopulmonary status limiting activity;Impaired balance (sitting and/or standing);Decreased safety awareness;Decreased cognition      OT Treatment/Interventions: Self-care/ADL training;Manual therapy;Therapeutic exercise;Patient/family education;Modalities;Balance training;Energy conservation;Therapeutic activities;Cognitive remediation/compensation;DME and/or AE instruction    OT Goals(Current goals can be found in the care plan section) Acute Rehab OT Goals Patient Stated Goal: to get some rehab OT Goal Formulation: With patient/family Time For Goal Achievement: 08/03/20 Potential to Achieve Goals: Good ADL Goals Pt Will Perform Grooming: with modified independence;standing Pt Will Perform Lower Body Dressing: with modified independence;sit to/from stand Pt Will Transfer to Toilet: with modified independence;ambulating Pt Will Perform Toileting - Clothing Manipulation and hygiene: with modified independence;sit to/from stand  OT Frequency: Min 2X/week   Barriers to D/C:    pt lives alone          AM-PAC OT "6 Clicks" Daily Activity     Outcome Measure Help from another person eating meals?: None Help from another person taking care of personal grooming?: None Help from another person toileting, which includes using toliet, bedpan, or urinal?: A Little Help from another person bathing (including washing, rinsing, drying)?: A Little Help from another person to put on and taking off regular upper body clothing?: None Help from another person to put on and taking off regular lower body clothing?: A Little 6 Click Score: 21   End of Session Equipment Utilized During Treatment: Oxygen Nurse Communication:  Mobility status;Precautions  Activity Tolerance: Patient tolerated treatment well Patient left: in chair;with call bell/phone within reach;with family/visitor present  OT Visit Diagnosis: Unsteadiness on feet (R26.81);Muscle weakness (generalized) (M62.81)                Time: 2202-5427 OT Time Calculation (min): 23 min Charges:  OT General Charges $OT Visit: 1 Visit OT Evaluation $OT Eval Low Complexity: 1 Low  Jackquline Denmark, MS, OTR/L , CBIS ascom [phone]  07/20/20, 1:19 PM

## 2020-07-20 NOTE — Progress Notes (Signed)
[facility]  ROUNDING NOTE   Subjective:   Seth Haley is a 65 y.o. male with a medical history of  hypertension, COPD with chronic respiratory failure on 2 L of oxygen continuous, history of liver cirrhosis with portal hypertension and splenomegaly, who was admitted to [facility] on 07/17/2020 for evaluation of SOB, increased abdominal distension and lower abdominal pain.  Patient laying in bed Wife at bedside Alert and oriented Currently NPO for procedure Wife and patient endorse poor appetite Drinks 2 Boost per day and partially forces 1 meal a day   Objective:  Vital signs in last 24 hours:  Temp:  [97.5 F (36.4 C)-97.8 F (36.6 C)] 97.5 F (36.4 C) (06/13 1123) Pulse Rate:  [67-84] 79 (06/13 1123) Resp:  [18-22] 18 (06/13 1123) BP: (104-121)/(61-82) 121/73 (06/13 1123) SpO2:  [93 %-100 %] 100 % (06/13 1123)  Weight change:  Filed Weights   07/17/20 0953  Weight: 127 kg    Intake/Output: I/O last 3 completed shifts: In: 313.4 [I.V.:12.3; IV Piggyback:301] Out: 1250 [Urine:1250]   Intake/Output this shift:  Total I/O In: 30.3 [IV Piggyback:30.3] Out: 200 [Urine:200]  Physical Exam: General: NAD, resting in bed  Head: Normocephalic, atraumatic. Moist oral mucosal membranes  Eyes: Anicteric  Lungs:  Clear to auscultation, normal breathing effort  Heart: Regular rate and rhythm  Abdomen:  Soft, nontender  Extremities:  trace peripheral edema  Neurologic: Nonfocal, moving all four extremities  Skin: No lesions, dry, flaky skin BLE       Basic Metabolic Panel: Recent Labs  Lab 07/17/20 1004 07/18/20 0455 07/19/20 0511 07/20/20 0441  NA 127* 129* 129* 129*  K 3.5 3.9 4.2 3.7  CL 77* 77* 77* 80*  CO2 32 37* 39* 38*  GLUCOSE 143* 140* 135* 111*  BUN 47* 47* 45* 35*  CREATININE 1.32* 1.40* 1.52* 1.02  CALCIUM 9.2 9.1 8.8* 8.6*  MG 2.4  --   --   --     Liver Function Tests: Recent Labs  Lab 07/17/20 1004 07/20/20 0441  AST 111*  58*  ALT 259* 139*  ALKPHOS 111 104  BILITOT 2.6* 1.6*  PROT 7.3 6.7  ALBUMIN 3.2* 3.6   Recent Labs  Lab 07/17/20 1004  LIPASE 24   Recent Labs  Lab 07/17/20 1142  AMMONIA 11    CBC: Recent Labs  Lab 07/17/20 1004 07/18/20 0455 07/19/20 0511 07/20/20 0441  WBC 15.0* 16.2* 13.3* 11.3*  NEUTROABS 13.2*  --   --   --   HGB 15.0 16.1 14.7 14.4  HCT 44.0 47.7 43.5 43.4  MCV 83.8 84.9 86.5 86.5  PLT 454* 431* 355 325    Cardiac Enzymes: No results for input(s): CKTOTAL, CKMB, CKMBINDEX, TROPONINI in the last 168 hours.  BNP: Invalid input(s): POCBNP  CBG: No results for input(s): GLUCAP in the last 168 hours.  Microbiology: Results for orders placed or performed during the hospital encounter of 07/17/20  Resp Panel by RT-PCR (Flu A&B, Covid) Nasopharyngeal Swab     Status: None   Collection Time: 07/17/20 10:04 AM   Specimen: Nasopharyngeal Swab; Nasopharyngeal(NP) swabs in vial transport medium  Result Value Ref Range Status   SARS Coronavirus 2 by RT PCR NEGATIVE NEGATIVE Final    Comment: (NOTE) SARS-CoV-2 target nucleic acids are NOT DETECTED.  The SARS-CoV-2 RNA is generally detectable in upper respiratory specimens during the acute phase of infection. The lowest concentration of SARS-CoV-2 viral copies this assay can detect is 138 copies/mL. A  negative result does not preclude SARS-Cov-2 infection and should not be used as the sole basis for treatment or other patient management decisions. A negative result may occur with  improper specimen collection/handling, submission of specimen other than nasopharyngeal swab, presence of viral mutation(s) within the areas targeted by this assay, and inadequate number of viral copies(<138 copies/mL). A negative result must be combined with clinical observations, patient history, and epidemiological information. The expected result is Negative.  Fact Sheet for Patients:   BloggerCourse.com  Fact Sheet for Healthcare Providers:  SeriousBroker.it  This test is no t yet approved or cleared by the [place] FDA and  has been authorized for detection and/or diagnosis of SARS-CoV-2 by FDA under an Emergency Use Authorization (EUA). This EUA will remain  in effect (meaning this test can be used) for the duration of the COVID-19 declaration under Section 564(b)(1) of the Act, 21 U.S.C.section 360bbb-3(b)(1), unless the authorization is terminated  or revoked sooner.       Influenza A by PCR NEGATIVE NEGATIVE Final   Influenza B by PCR NEGATIVE NEGATIVE Final    Comment: (NOTE) The Xpert Xpress SARS-CoV-2/FLU/RSV plus assay is intended as an aid in the diagnosis of influenza from Nasopharyngeal swab specimens and should not be used as a sole basis for treatment. Nasal washings and aspirates are unacceptable for Xpert Xpress SARS-CoV-2/FLU/RSV testing.  Fact Sheet for Patients: BloggerCourse.com  Fact Sheet for Healthcare Providers: SeriousBroker.it  This test is not yet approved or cleared by the [place] FDA and has been authorized for detection and/or diagnosis of SARS-CoV-2 by FDA under an Emergency Use Authorization (EUA). This EUA will remain in effect (meaning this test can be used) for the duration of the COVID-19 declaration under Section 564(b)(1) of the Act, 21 U.S.C. section 360bbb-3(b)(1), unless the authorization is terminated or revoked.  Performed at [facility], [address] [place], [place] [address]   Blood culture (routine x 2)     Status: None (Preliminary result)   Collection Time: 07/17/20  1:53 PM   Specimen: BLOOD  Result Value Ref Range Status   Specimen Description BLOOD RIGHT ANTECUBITAL  Final   Special Requests   Final    BOTTLES DRAWN AEROBIC AND ANAEROBIC Blood Culture adequate volume   Culture    Final    NO GROWTH 3 DAYS Performed at [facility], [address], [place], [place] [address]    Report Status PENDING  Incomplete  Blood culture (routine x 2)     Status: None (Preliminary result)   Collection Time: 07/17/20  1:53 PM   Specimen: BLOOD  Result Value Ref Range Status   Specimen Description BLOOD BLOOD RIGHT FOREARM  Final   Special Requests   Final    BOTTLES DRAWN AEROBIC AND ANAEROBIC Blood Culture adequate volume   Culture   Final    NO GROWTH 3 DAYS Performed at [facility], [address], [place], [place] [address]    Report Status PENDING  Incomplete  Body fluid culture w Gram Stain     Status: None (Preliminary result)   Collection Time: 07/17/20  4:30 PM   Specimen: PATH Cytology Peritoneal fluid  Result Value Ref Range Status   Specimen Description   Final    PERITONEAL Performed at [facility], [address] [place], [place] [address]    Special Requests   Final    NONE Performed at [facility], [address] [place], [place] [address]  Gram Stain NO WBC SEEN NO ORGANISMS SEEN   Final   Culture   Final    NO GROWTH 2 DAYS Performed at [facility], [address] [place], [place] [address]    Report Status PENDING  Incomplete    Coagulation Studies: No results for input(s): LABPROT, INR in the last 72 hours.  Urinalysis: Recent Labs    07/18/20 1715  COLORURINE AMBER*  LABSPEC 1.018  PHURINE 5.0  GLUCOSEU NEGATIVE  HGBUR NEGATIVE  BILIRUBINUR NEGATIVE  KETONESUR NEGATIVE  PROTEINUR NEGATIVE  NITRITE NEGATIVE  LEUKOCYTESUR NEGATIVE      Imaging: CT ABDOMEN PELVIS W CONTRAST  Result Date: 07/19/2020 CLINICAL DATA:  Acute abdominal pain.  Hypotension. EXAM: CT ABDOMEN AND PELVIS WITH CONTRAST TECHNIQUE: Multidetector CT imaging of the abdomen and pelvis was performed using the standard protocol following bolus administration of intravenous contrast. CONTRAST:  100mL  OMNIPAQUE IOHEXOL 300 MG/ML  SOLN COMPARISON:  05/31/2020 and 07/17/2020 FINDINGS: Lower chest: Small right pleural effusion unchanged to slightly worse. Left lung is clear. Hepatobiliary: Somewhat small nodular liver likely due to cirrhosis. No focal liver mass. Evidence of cholelithiasis with cholesterol stone noted and unchanged. Biliary tree is unremarkable. Pancreas: Normal. Spleen: Normal. Adrenals/Urinary Tract: Adrenal glands are normal. Kidneys are normal in size without hydronephrosis. Right renal cyst unchanged. Ureters and bladder are normal. Stomach/Bowel: Stomach is unremarkable. There is an umbilical hernia containing a short segment of small bowel which is now incarcerated as there is interval development of dilatation of the small bowel loops immediately proximal to entering the hernia sac measuring 5 cm in diameter. Small bowel distal to the hernia sac is normal. Air is present throughout the colon. There is diverticulosis of the sigmoid colon. Vascular/Lymphatic: Calcified plaque over the abdominal aorta with moderate mural thrombus over the distal abdominal aorta. Again noted is patient's distal abdominal aortic aneurysm measuring 4.8 x 5.7 cm in AP and transverse dimension at the same level as measured previously (previously 4.3 x 5.7 cm). Greatest AP dimension is 5.1 cm which is unchanged. No adenopathy. Reproductive: Normal. Other: Moderate ascites without significant change. There are a few areas of soft tissue nodularity associated with ascites concerning for possible peritoneal spread of neoplastic disease/malignant ascites. Musculoskeletal: Degenerative change of the spine and hips. IMPRESSION: 1. Umbilical hernia containing a short segment of small bowel which is now incarcerated as there is interval development of small bowel obstruction immediately proximal to the hernia sac measuring 5 cm in diameter. 2. Moderate ascites without significant change. There are a few areas of soft tissue  nodularity associated with ascites concerning for possible peritoneal spread of neoplastic disease/malignant ascites. Recommend correlation with known underlying neoplasm. 3. Stable distal abdominal aortic aneurysm measuring 4.8 x 5.7 cm in AP and transverse dimension as measured previously (previously 4.3 x 5.7 cm). Recommend referral to a vascular specialist if not already done. This recommendation follows ACR consensus guidelines: White Paper of the ACR Incidental Findings Committee II on Vascular Findings. J Am Coll Radiol 2013; 10:789-794. 4. Cholelithiasis. 5. Right renal cyst unchanged. 6. Colonic diverticulosis. 7. Aortic atherosclerosis. 8. Somewhat small nodular liver possibly due to cirrhosis. Aortic Atherosclerosis (ICD10-I70.0). These results were called by telephone at the time of interpretation on 07/19/2020 at 5:05 pm to provider Louisa SecondJAMES ANTEZANA , who verbally acknowledged these results. Electronically Signed   By: Elberta Fortisaniel  Boyle M.D.   On: 07/19/2020 17:03   US RENAL  Result Date: 07/19/2020 CLINICAL DATA:  AK I EXAM: RENAL /  URINARY TRACT ULTRASOUND COMPLETE COMPARISON:  July 19, 2020 FINDINGS: Right Kidney: Renal measurements: 12.3 x 5.9 x 5.7 cm = volume: 215 mL. Echogenicity is within normal limits. There is a anechoic mass with posterior acoustic enhancement consistent with a benign cyst which measures 2.9 x 3.3 x 3.0 cm. No hydronephrosis. Left Kidney: Renal measurements: 12.0 x 4.4 x 5.4 cm = volume: 150 mL. Echogenicity is within normal limits. There is an echogenic focus consistent with a nonobstructive nephrolithiasis vs calcification as seen on prior CT. No hydronephrosis. Bladder: Appears normal for degree of bladder distention. Other: Incidental note of small to moderate volume ascites IMPRESSION: 1. No hydronephrosis. 2. Small to moderate volume ascites. Electronically Signed   By: Meda Klinefelter MD   On: 07/19/2020 15:52     Medications:    sodium chloride     sodium  chloride Stopped (07/20/20 0631)   albumin human 60 mL/hr at 07/20/20 0737    bisacodyl  10 mg Oral Daily   clonazepam  0.5 mg Oral QID   [START ON 07/21/2020] COVID-19 mRNA Vac-TriS ([employer])  0.3 mL Intramuscular ONCE-1600   feeding supplement  237 mL Oral TID BM   lactulose  10 g Oral BID   polyethylene glycol  17 g Oral Daily   Ensure Max Protein  11 oz Oral BID   senna-docusate  2 tablet Oral BID   sodium chloride flush  3 mL Intravenous Q12H   tiotropium  1 capsule Inhalation Daily   traZODone  50 mg Oral QHS   sodium chloride, albuterol, alum & mag hydroxide-simeth, ondansetron **OR** ondansetron (ZOFRAN) IV, sodium chloride flush  Assessment/ Plan:  Mr. Seth Haley is a 65 y.o.  male with a medical history of  hypertension, COPD with chronic respiratory failure on 2 L of oxygen continuous, history of liver cirrhosis with portal hypertension and splenomegaly, who was admitted to [facility] on 07/17/2020 for evaluation of SOB, increased abdominal distension and lower abdominal pain.  Acute Kidney Injury with baseline creatinine 0.9 and GFR of >60 on 06/05/20.  Acute kidney injury secondary to dehydration and diuretic use Renal ultrasound negative for obstruction No IV contrast exposure No indication for dialysis at this time Creatinine greatly improved with IVF Sufficient UOP D/c IVF Receiving Albumin TID   Lab Results  Component Value Date   CREATININE 1.02 07/20/2020   CREATININE 1.52 (H) 07/19/2020   CREATININE 1.40 (H) 07/18/2020    Intake/Output Summary (Last 24 hours) at 07/20/2020 1232 Last data filed at 07/20/2020 0737 Gross per 24 hour  Intake 343.61 ml  Output 1150 ml  Net -806.39 ml    2. Hyponatremia Currently 129 Will monitor as treat as needed Satisfied with this result based on history of liver cirrhosis     LOS: 3   6/13/202212:32 PM

## 2020-07-20 NOTE — Progress Notes (Signed)
1       [place] [facility]   PATIENT NAME: Seth Haley    MR#:  [id number]  DATE OF BIRTH:  04/07/1955  SUBJECTIVE:  CHIEF COMPLAINT:   Chief Complaint  Patient presents with   Shortness of Breath  Patient reports passing flatus and bowel movement this morning. REVIEW OF SYSTEMS:  Review of Systems  Constitutional:  Positive for malaise/fatigue. Negative for diaphoresis, fever and weight loss.  HENT:  Negative for ear discharge, ear pain, hearing loss, nosebleeds, sore throat and tinnitus.   Eyes:  Negative for blurred vision and pain.  Respiratory:  Positive for shortness of breath. Negative for cough, hemoptysis and wheezing.   Cardiovascular:  Negative for chest pain, palpitations, orthopnea and leg swelling.  Gastrointestinal:  Positive for constipation. Negative for abdominal pain, blood in stool, diarrhea, heartburn, nausea and vomiting.  Genitourinary:  Negative for dysuria, frequency and urgency.  Musculoskeletal:  Negative for back pain and myalgias.  Skin:  Negative for itching and rash.  Neurological:  Negative for dizziness, tingling, tremors, focal weakness, seizures, weakness and headaches.  Psychiatric/Behavioral:  Negative for depression. The patient is nervous/anxious and has insomnia.   DRUG ALLERGIES:   Allergies  Allergen Reactions   Sulfa Antibiotics Other (See Comments)   VITALS:  Blood pressure 121/73, pulse 79, temperature (!) 97.5 F (36.4 C), temperature source Oral, resp. rate 18, height 5\' 6"  (1.676 m), weight 127 kg, SpO2 100 %. PHYSICAL EXAMINATION:  Physical Exam 65 year old male lying in the bed comfortably without any acute distress Eyes pupils equal round reactive light accommodation Lungs decreased breath sound the bases.  No wheezing, rales or crepitation Cardiovascular S1-S2 normal, no murmur rales or gallop Abdomen firm, minimally distended, hypoactive bowel sounds Neuro alert and oriented, nonfocal exam Skin no rash  or lesion LABORATORY PANEL:  Male CBC Recent Labs  Lab 07/20/20 0441  WBC 11.3*  HGB 14.4  HCT 43.4  PLT 325    ------------------------------------------------------------------------------------------------------------------ Chemistries  Recent Labs  Lab 07/17/20 1004 07/18/20 0455 07/20/20 0441  NA 127*   < > 129*  K 3.5   < > 3.7  CL 77*   < > 80*  CO2 32   < > 38*  GLUCOSE 143*   < > 111*  BUN 47*   < > 35*  CREATININE 1.32*   < > 1.02  CALCIUM 9.2   < > 8.6*  MG 2.4  --   --   AST 111*  --  58*  ALT 259*  --  139*  ALKPHOS 111  --  104  BILITOT 2.6*  --  1.6*   < > = values in this interval not displayed.    RADIOLOGY:  CT ABDOMEN PELVIS W CONTRAST  Result Date: 07/19/2020 CLINICAL DATA:  Acute abdominal pain.  Hypotension. EXAM: CT ABDOMEN AND PELVIS WITH CONTRAST TECHNIQUE: Multidetector CT imaging of the abdomen and pelvis was performed using the standard protocol following bolus administration of intravenous contrast. CONTRAST:  09/18/2020 OMNIPAQUE IOHEXOL 300 MG/ML  SOLN COMPARISON:  05/31/2020 and 07/17/2020 FINDINGS: Lower chest: Small right pleural effusion unchanged to slightly worse. Left lung is clear. Hepatobiliary: Somewhat small nodular liver likely due to cirrhosis. No focal liver mass. Evidence of cholelithiasis with cholesterol stone noted and unchanged. Biliary tree is unremarkable. Pancreas: Normal. Spleen: Normal. Adrenals/Urinary Tract: Adrenal glands are normal. Kidneys are normal in size without hydronephrosis. Right renal cyst unchanged. Ureters and bladder are normal. Stomach/Bowel: Stomach is unremarkable. There  is an umbilical hernia containing a short segment of small bowel which is now incarcerated as there is interval development of dilatation of the small bowel loops immediately proximal to entering the hernia sac measuring 5 cm in diameter. Small bowel distal to the hernia sac is normal. Air is present throughout the colon. There is  diverticulosis of the sigmoid colon. Vascular/Lymphatic: Calcified plaque over the abdominal aorta with moderate mural thrombus over the distal abdominal aorta. Again noted is patient's distal abdominal aortic aneurysm measuring 4.8 x 5.7 cm in AP and transverse dimension at the same level as measured previously (previously 4.3 x 5.7 cm). Greatest AP dimension is 5.1 cm which is unchanged. No adenopathy. Reproductive: Normal. Other: Moderate ascites without significant change. There are a few areas of soft tissue nodularity associated with ascites concerning for possible peritoneal spread of neoplastic disease/malignant ascites. Musculoskeletal: Degenerative change of the spine and hips. IMPRESSION: 1. Umbilical hernia containing a short segment of small bowel which is now incarcerated as there is interval development of small bowel obstruction immediately proximal to the hernia sac measuring 5 cm in diameter. 2. Moderate ascites without significant change. There are a few areas of soft tissue nodularity associated with ascites concerning for possible peritoneal spread of neoplastic disease/malignant ascites. Recommend correlation with known underlying neoplasm. 3. Stable distal abdominal aortic aneurysm measuring 4.8 x 5.7 cm in AP and transverse dimension as measured previously (previously 4.3 x 5.7 cm). Recommend referral to a vascular specialist if not already done. This recommendation follows ACR consensus guidelines: White Paper of the ACR Incidental Findings Committee II on Vascular Findings. J Am Coll Radiol 2013; 10:789-794. 4. Cholelithiasis. 5. Right renal cyst unchanged. 6. Colonic diverticulosis. 7. Aortic atherosclerosis. 8. Somewhat small nodular liver possibly due to cirrhosis. Aortic Atherosclerosis (ICD10-I70.0). These results were called by telephone at the time of interpretation on 07/19/2020 at 5:05 pm to provider Louisa Second , who verbally acknowledged these results. Electronically Signed    By: Elberta Fortis M.D.   On: 07/19/2020 17:03   US RENAL  Result Date: 07/19/2020 CLINICAL DATA:  AK I EXAM: RENAL / URINARY TRACT ULTRASOUND COMPLETE COMPARISON:  July 19, 2020 FINDINGS: Right Kidney: Renal measurements: 12.3 x 5.9 x 5.7 cm = volume: 215 mL. Echogenicity is within normal limits. There is a anechoic mass with posterior acoustic enhancement consistent with a benign cyst which measures 2.9 x 3.3 x 3.0 cm. No hydronephrosis. Left Kidney: Renal measurements: 12.0 x 4.4 x 5.4 cm = volume: 150 mL. Echogenicity is within normal limits. There is an echogenic focus consistent with a nonobstructive nephrolithiasis vs calcification as seen on prior CT. No hydronephrosis. Bladder: Appears normal for degree of bladder distention. Other: Incidental note of small to moderate volume ascites IMPRESSION: 1. No hydronephrosis. 2. Small to moderate volume ascites. Electronically Signed   By: Meda Klinefelter MD   On: 07/19/2020 15:52   ASSESSMENT AND PLAN:  65 y.o. 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  Patient was recently hospitalized about a month ago and had paracentesis done at the time.  Ascites Patient has a history of liver cirrhosis and admitted for evaluation of abdominal distention associated with pain.  He denies having any fever/chills As fluid culture negative  we'll stop antibiotics. IR guided paracentesis on 6/10 with removal of 8 L of peritoneal fluid.  Fluid cultures negative for any bacterial growth  Dehydration with hyponatremia AKI Secondary to poor oral intake and concomitant diuretic use.  Also hypotension Hold diuretics for now US renal negative for any obstruction Nephrology  following.  Getting albumin for volume expansion Creatinine improved from 1.5->1.05  Constipation/gas and bloating symptoms Umbilical hernia with bowel obstruction Surgery was able to reduce at bedside last night, no plans for hernia repair at this time per surgery Patient had bowel movement this morning per report  Insomnia/anxiety Continue Klonopin 0.5 mg p.o. 4 times daily and trazodone at night for sleep   Abdominal aortic aneurysm without rupture Incidental finding with CT abdomen showing aneurysm measuring 5.7 cm in diameter Appreciate vascular surgery input.  They are planning for EVAR surgery for rapidly growing urgent AAA.  On Wednesday 6/15 -pulmonary and cardiac clearance have been obtained and is cleared Patient is medically clear for planned procedure   COPD with chronic respiratory failure Not acutely exacerbated Worsening shortness of breath appears to be secondary to massive ascites compressing on the diaphragm. Continue as needed bronchodilator therapy Continue home oxygen at 2 L to maintain pulse oximetry greater than 92%   History of liver cirrhosis with portal hypertension and splenomegaly Maintain low-sodium diet Hold diuretics for now due to significant dehydration and electrolyte abnormalities Continue lactulose   Morbid obesity (BMI 45.19) Complicates overall prognosis. Lifestyle modification and exercise has been discussed with patient   Transaminitis Likely due to worsening liver cirrhosis Monitor liver enzymes  Weakness PT, OT recommends SNF.  Will consult TOC for placement    Body mass index is 45.19 kg/m.  Net IO Since Admission: -1,986.39 mL [07/20/20 1419]      Status is: Inpatient  Remains inpatient appropriate because:Ongoing diagnostic testing needed not appropriate for outpatient work up planned vascular surgery on 6/15  Dispo: The patient is from: Home              Anticipated d/c is to: SNF              Patient currently is  not medically stable to d/c.   Difficult to place patient No    DVT prophylaxis:       SCDs Start: 07/17/20 1441     Family Communication: Updated Sister at bedside on 6/13   All the records are reviewed and case discussed with Care Management/Social Worker. Management plans discussed with the patient, family and they are in agreement.  CODE STATUS: Full Code Level of care: Med-Surg  TOTAL TIME TAKING CARE OF THIS PATIENT: 25 minutes.   More than 50% of the time was spent in counseling/coordination of care: YES  POSSIBLE D/C IN 5-6 DAYS, DEPENDING ON CLINICAL CONDITION.  And vascular surgery procedure on 6/15   Delfino Lovett M.D on 07/20/2020 at 2:19 PM  [facility]   CC: Primary care physician; Simonne Martinet, NP  Note: This dictation was prepared with Dragon dictation along with smaller phrase technology. Any transcriptional errors that result from this process are unintentional.

## 2020-07-20 NOTE — Plan of Care (Signed)

## 2020-07-20 NOTE — Progress Notes (Signed)
[facility]  Patient: Seth Baumgartnerorman Gioia Ranes Shaw / Admit Date: 07/17/2020 / Date of Encounter: 07/20/2020, 8:43 AM   Subjective: No current significant change in condition from yesterday.  Patient still has abdominal distention after paracentesis so from ascites.  No evidence of anginal symptoms or congestive heart failure at this time with continued stable abnormal EKG.  The patient is currently at lowest risk possible for cardiovascular complication with vascular surgery.  Review of Systems: Positive for: Abdominal distention Negative for: Vision change, hearing change, syncope, dizziness, nausea, vomiting,diarrhea, bloody stool, stomach pain, cough, congestion, diaphoresis, urinary frequency, urinary pain,skin lesions, skin rashes Others previously listed  Objective: Telemetry: Normal sinus rhythm Physical Exam: Blood pressure 113/61, pulse 84, temperature 97.8 F (36.6 C), temperature source Oral, resp. rate 18, height 5\' 6"  (1.676 m), weight 127 kg, SpO2 98 %. Body mass index is 45.19 kg/m. General: Well developed, well nourished, in no acute distress. Head: Normocephalic, atraumatic, sclera non-icteric, no xanthomas, nares are without discharge. Neck: No apparent masses Lungs: Normal respirations with no wheezes, no rhonchi, no rales , no crackles   Heart: Regular rate and rhythm, normal S1 S2, no murmur, no rub, no gallop, PMI is normal size and placement, carotid upstroke normal without bruit, jugular venous pressure normal Abdomen: Soft, non-tender, n distended with normoactive bowel sounds. No hepatosplenomegaly. Abdominal aorta is normal size without bruit Extremities: Ace edema, no clubbing, no cyanosis, no ulcers,  Peripheral: 2+ radial, 2+ femoral, 2+ dorsal pedal pulses Neuro: Alert and oriented. Moves all extremities spontaneously. Psych:  Responds to questions appropriately with a normal affect.   Intake/Output Summary (Last 24 hours) at  07/20/2020 0843 Last data filed at 07/20/2020 0737 Gross per 24 hour  Intake 343.61 ml  Output 1150 ml  Net -806.39 ml    Inpatient Medications:   bisacodyl  10 mg Oral Daily   clonazepam  0.5 mg Oral QID   [START ON 07/21/2020] COVID-19 mRNA Vac-TriS ([employer])  0.3 mL Intramuscular ONCE-1600   feeding supplement  237 mL Oral TID BM   lactulose  10 g Oral BID   polyethylene glycol  17 g Oral Daily   Ensure Max Protein  11 oz Oral BID   senna-docusate  2 tablet Oral BID   sodium chloride flush  3 mL Intravenous Q12H   tiotropium  1 capsule Inhalation Daily   traZODone  50 mg Oral QHS   Infusions:   sodium chloride     sodium chloride Stopped (07/20/20 0631)   albumin human 60 mL/hr at 07/20/20 0737    Labs: Recent Labs    07/17/20 1004 07/18/20 0455 07/19/20 0511 07/20/20 0441  NA 127*   < > 129* 129*  K 3.5   < > 4.2 3.7  CL 77*   < > 77* 80*  CO2 32   < > 39* 38*  GLUCOSE 143*   < > 135* 111*  BUN 47*   < > 45* 35*  CREATININE 1.32*   < > 1.52* 1.02  CALCIUM 9.2   < > 8.8* 8.6*  MG 2.4  --   --   --    < > = values in this interval not displayed.   Recent Labs    07/17/20 1004 07/20/20 0441  AST 111* 58*  ALT 259* 139*  ALKPHOS 111 104  BILITOT 2.6* 1.6*  PROT 7.3 6.7  ALBUMIN 3.2* 3.6   Recent Labs    07/17/20 1004 07/18/20 0455 07/19/20 0511  07/20/20 0441  WBC 15.0*   < > 13.3* 11.3*  NEUTROABS 13.2*  --   --   --   HGB 15.0   < > 14.7 14.4  HCT 44.0   < > 43.5 43.4  MCV 83.8   < > 86.5 86.5  PLT 454*   < > 355 325   < > = values in this interval not displayed.   No results for input(s): CKTOTAL, CKMB, TROPONINI in the last 72 hours. Invalid input(s): POCBNP No results for input(s): HGBA1C in the last 72 hours.   Weights: Filed Weights   07/17/20 0953  Weight: 127 kg     Radiology/Studies:  CT ABDOMEN PELVIS WO CONTRAST  Result Date: 07/17/2020 CLINICAL DATA:  Abdominal distension, shortness of breath EXAM: CT ABDOMEN AND PELVIS  WITHOUT CONTRAST TECHNIQUE: Multidetector CT imaging of the abdomen and pelvis was performed following the standard protocol without IV contrast. COMPARISON:  CT 05/31/2020 FINDINGS: Lower chest: Small right pleural effusion with associated compressive atelectasis. Left lung bases clear. Heart size is normal. Hepatobiliary: Subtle surface nodularity of the liver suggesting underlying cirrhosis. No focal liver lesion is identified on noncontrast study. Cholelithiasis without evidence to suggest cholecystitis. No biliary dilatation. Pancreas: Unremarkable. No pancreatic ductal dilatation or surrounding inflammatory changes. Spleen: Spleen is upper limits of normal in size. Adrenals/Urinary Tract: Unremarkable adrenal glands. Stable upper pole right renal cyst. Tiny punctate calcification within midpole of the left kidney which may represent a vascular calcification or nonobstructing stone. No hydronephrosis. Urinary bladder is decompressed, limiting its evaluation. Stomach/Bowel: Stomach is within normal limits. Appendix not definitively identified. Colonic diverticulosis. No evidence of bowel wall thickening, distention, or inflammatory changes. Moderate volume stool throughout the colon Vascular/Lymphatic: Aortoiliac atherosclerosis with infrarenal abdominal aortic aneurysm measuring 5.7 cm in maximal transverse dimension (series 2, image 51) previously 5.1 cm. No abdominopelvic lymphadenopathy. Reproductive: Prostate is unremarkable. Other: Large volume ascites. No definite omental or peritoneal masses identified on noncontrast study. No pneumoperitoneum. Paraumbilical hernia containing small bowel without evidence of obstruction. Musculoskeletal: No acute or significant osseous findings. IMPRESSION: 1. Large volume ascites. 2. Subtle surface nodularity of the liver suggesting underlying cirrhosis. 3. Small right pleural effusion with associated compressive atelectasis. 4. Cholelithiasis without evidence to suggest  cholecystitis. 5. Colonic diverticulosis without evidence of acute diverticulitis. 6. Paraumbilical hernia containing small bowel without evidence of obstruction. 7. 5.7 cm infrarenal abdominal aortic aneurysm, previously 5.1 cm. Recommend referral to a vascular specialist. This recommendation follows ACR consensus guidelines: White Paper of the ACR Incidental Findings Committee II on Vascular Findings. J Am Coll Radiol 2013; 10:789-794. 8. Aortic atherosclerosis (ICD10-I70.0). Electronically Signed   By: Duanne Guess D.O.   On: 07/17/2020 11:07   CT ABDOMEN PELVIS W CONTRAST  Result Date: 07/19/2020 CLINICAL DATA:  Acute abdominal pain.  Hypotension. EXAM: CT ABDOMEN AND PELVIS WITH CONTRAST TECHNIQUE: Multidetector CT imaging of the abdomen and pelvis was performed using the standard protocol following bolus administration of intravenous contrast. CONTRAST:  OMNIPAQUE IOHEXOL 300 MG/ML  SOLN COMPARISON:  05/31/2020 and 07/17/2020 FINDINGS: Lower chest: Small right pleural effusion unchanged to slightly worse. Left lung is clear. Hepatobiliary: Somewhat small nodular liver likely due to cirrhosis. No focal liver mass. Evidence of cholelithiasis with cholesterol stone noted and unchanged. Biliary tree is unremarkable. Pancreas: Normal. Spleen: Normal. Adrenals/Urinary Tract: Adrenal glands are normal. Kidneys are normal in size without hydronephrosis. Right renal cyst unchanged. Ureters and bladder are normal. Stomach/Bowel: Stomach is unremarkable. There is an umbilical  hernia containing a short segment of small bowel which is now incarcerated as there is interval development of dilatation of the small bowel loops immediately proximal to entering the hernia sac measuring 5 cm in diameter. Small bowel distal to the hernia sac is normal. Air is present throughout the colon. There is diverticulosis of the sigmoid colon. Vascular/Lymphatic: Calcified plaque over the abdominal aorta with moderate mural  thrombus over the distal abdominal aorta. Again noted is patient's distal abdominal aortic aneurysm measuring 4.8 x 5.7 cm in AP and transverse dimension at the same level as measured previously (previously 4.3 x 5.7 cm). Greatest AP dimension is 5.1 cm which is unchanged. No adenopathy. Reproductive: Normal. Other: Moderate ascites without significant change. There are a few areas of soft tissue nodularity associated with ascites concerning for possible peritoneal spread of neoplastic disease/malignant ascites. Musculoskeletal: Degenerative change of the spine and hips. IMPRESSION: 1. Umbilical hernia containing a short segment of small bowel which is now incarcerated as there is interval development of small bowel obstruction immediately proximal to the hernia sac measuring 5 cm in diameter. 2. Moderate ascites without significant change. There are a few areas of soft tissue nodularity associated with ascites concerning for possible peritoneal spread of neoplastic disease/malignant ascites. Recommend correlation with known underlying neoplasm. 3. Stable distal abdominal aortic aneurysm measuring 4.8 x 5.7 cm in AP and transverse dimension as measured previously (previously 4.3 x 5.7 cm). Recommend referral to a vascular specialist if not already done. This recommendation follows ACR consensus guidelines: White Paper of the ACR Incidental Findings Committee II on Vascular Findings. J Am Coll Radiol 2013; 10:789-794. 4. Cholelithiasis. 5. Right renal cyst unchanged. 6. Colonic diverticulosis. 7. Aortic atherosclerosis. 8. Somewhat small nodular liver possibly due to cirrhosis. Aortic Atherosclerosis (ICD10-I70.0). These results were called by telephone at the time of interpretation on 07/19/2020 at 5:05 pm to provider Louisa Second , who verbally acknowledged these results. Electronically Signed   By: Elberta Fortis M.D.   On: 07/19/2020 17:03   US RENAL  Result Date: 07/19/2020 CLINICAL DATA:  AK I EXAM: RENAL /  URINARY TRACT ULTRASOUND COMPLETE COMPARISON:  July 19, 2020 FINDINGS: Right Kidney: Renal measurements: 12.3 x 5.9 x 5.7 cm = volume: 215 mL. Echogenicity is within normal limits. There is a anechoic mass with posterior acoustic enhancement consistent with a benign cyst which measures 2.9 x 3.3 x 3.0 cm. No hydronephrosis. Left Kidney: Renal measurements: 12.0 x 4.4 x 5.4 cm = volume: 150 mL. Echogenicity is within normal limits. There is an echogenic focus consistent with a nonobstructive nephrolithiasis vs calcification as seen on prior CT. No hydronephrosis. Bladder: Appears normal for degree of bladder distention. Other: Incidental note of small to moderate volume ascites IMPRESSION: 1. No hydronephrosis. 2. Small to moderate volume ascites. Electronically Signed   By: Meda Klinefelter MD   On: 07/19/2020 15:52   US Paracentesis  Result Date: 07/17/2020 INDICATION: Recurrent ascites EXAM: ULTRASOUND GUIDED  PARACENTESIS MEDICATIONS: None. COMPLICATIONS: None immediate. PROCEDURE: Informed written consent was obtained from the patient after a discussion of the risks, benefits and alternatives to treatment. A timeout was performed prior to the initiation of the procedure. Initial ultrasound scanning demonstrates a large amount of ascites within the right lower abdominal quadrant. The right lower abdomen was prepped and draped in the usual sterile fashion. 1% lidocaine was used for local anesthesia. Following this, a 6 Fr Safe-T-Centesis catheter was introduced. An ultrasound image was saved for documentation purposes. The paracentesis was  performed. The catheter was removed and a dressing was applied. The patient tolerated the procedure well without immediate post procedural complication. FINDINGS: A total of approximately 8 L of straw-colored clear fluid was removed. Samples were sent to the laboratory as requested by the clinical team. IMPRESSION: Successful ultrasound-guided paracentesis yielding 8 L  liters of peritoneal fluid. Electronically Signed   By: Acquanetta Belling M.D.   On: 07/17/2020 17:14   DG Chest Portable 1 View  Result Date: 07/17/2020 CLINICAL DATA:  Shortness of breath. EXAM: PORTABLE CHEST 1 VIEW COMPARISON:  06/01/2020 FINDINGS: The heart size and mediastinal contours are within normal limits. Similar chronic interstitial coarsening within the lower lung zones. Both lungs are clear. The visualized skeletal structures are unremarkable. IMPRESSION: No active cardiopulmonary abnormalities. Electronically Signed   By: Signa Kell M.D.   On: 07/17/2020 10:42   US ABDOMEN LIMITED RUQ (LIVER/GB)  Result Date: 07/17/2020 CLINICAL DATA:  Transaminitis. EXAM: ULTRASOUND ABDOMEN LIMITED RIGHT UPPER QUADRANT COMPARISON:  CT of same day. FINDINGS: Gallbladder: Cholelithiasis and sludge is noted. No gallbladder wall thickening is noted. No sonographic Murphy's sign is noted. Common bile duct: Diameter: 6 mm which is within normal limits. Liver: No focal lesion identified. Increased echogenicity of hepatic parenchyma is noted with nodular contours suggesting hepatic cirrhosis. Portal vein is patent on color Doppler imaging with normal direction of blood flow towards the liver. Other: Moderate ascites is noted. IMPRESSION: Findings consistent with hepatic cirrhosis. No definite focal sonographic hepatic abnormality is noted. Cholelithiasis and sludge is noted without definite evidence of cholecystitis. Moderate ascites. Electronically Signed   By: Lupita Raider M.D.   On: 07/17/2020 12:39     Assessment and Recommendation  65 y.o. male with known significant peripheral vascular disease COPD due to significant tobacco abuse and large abdominal aortic aneurysm with liver cirrhosis and ascites status post paracentesis with no current evidence of acute coronary syndrome and/or congestive heart failure 1.  Did continue supportive care of liver cirrhosis ascites with paracentesis and other  treatments 2.  No restrictions to further intervention of peripheral vascular disease and abdominal aortic aneurysm 3.  No further cardiac diagnostics necessary at this time 4.  Further treatment options after above  Signed, Arnoldo Hooker M.D. FACC

## 2020-07-20 NOTE — Progress Notes (Signed)
Pulmonary and Critical Care          Date: 07/20/2020,   MRN# [id number] Seth Haley [facility] [birth year]     AdmissionWeight: 127 kg                 CurrentWeight: 127 kg   Referring physician: Dr Docia Barrier   CHIEF COMPLAINT:   Pulmonary preoperative evaluation for vascular surgery.   HISTORY OF PRESENT ILLNESS   This is a pleasant 65 year old male with a history of COPD, dyslipidemia and previous umbilical hernia, he has chronic hypoxemia and uses supplemental oxygen at home at 2 L/min nasal cannula, also has significant history with liver cirrhosis and portal hypertension as well as splenomegaly and actually came in on this admission with abdominal discomfort swelling and distention.  Associated with abdominal discomfort is worsening shortness of breath.  In the emergency room family had shared that patient has been declining clinically and has become physically deconditioned with severe fatigue and malaise.  His lab work is significant for severe hyponatremia in the 120s, renal failure with creatinine 1.3 and transaminitis.  Liver synthetic function including INR and albumin are within reference range.  Patient had CT of the abdomen performed which showed large volume ascites, right pleural effusion and compressive atelectasis, diverticulosis periumbilical hernia containing small bowel, and a significant 5.7 cm infrarenal AAA which was previously 5.1 cm.  Pulmonary consultation placed for additional evaluation and risk stratification for vascular surgery AAA repair. Sister at bedside we rewviewed care plan together with patient.  Patient generally sees pulmonary at [facility] with Dr Meredeth Ide.     07/20/20- patient reports improvement clinically , he seems somewhat sedated but calm,  he shares he recently started Klonopin due to anxiety associated with advanced COPD and severe dyspnea.     PAST MEDICAL HISTORY   Past Medical History:  Diagnosis Date   COPD (chronic obstructive  pulmonary disease) (HCC)    Hypertension    Umbilical hernia      SURGICAL HISTORY   History reviewed. No pertinent surgical history.   FAMILY HISTORY   Family History  Problem Relation Age of Onset   Hypertension Mother      SOCIAL HISTORY   Social History   Tobacco Use   Smoking status: Former    Pack years: 0.00   Smokeless tobacco: Never  Substance Use Topics   Alcohol use: Not Currently   Drug use: Not Currently     MEDICATIONS    Home Medication:    Current Medication:  Current Facility-Administered Medications:    0.9 %  sodium chloride infusion, 250 mL, Intravenous, PRN, Agbata, Tochukwu, MD   0.9 %  sodium chloride infusion, , Intravenous, Continuous, Lateef, Munsoor, MD, Stopped at 07/20/20 0631   albumin human 25 % solution 25 g, 25 g, Intravenous, Q8H, Lateef, Munsoor, MD, Last Rate: 60 mL/hr at 07/20/20 0737, Infusion Verify at 07/20/20 0737   albuterol (PROVENTIL) (2.5 MG/3ML) 0.083% nebulizer solution 3 mL, 3 mL, Inhalation, Q6H PRN, Agbata, Tochukwu, MD, 3 mL at 07/19/20 0211   alum & mag hydroxide-simeth (MAALOX/MYLANTA) 200-200-20 MG/5ML suspension 30 mL, 30 mL, Oral, Q6H PRN, Sherryll Burger, Vipul, MD   bisacodyl (DULCOLAX) EC tablet 10 mg, 10 mg, Oral, Daily, Sherryll Burger, Vipul, MD, 10 mg at 07/19/20 0936   clonazePAM (KLONOPIN) disintegrating tablet 0.5 mg, 0.5 mg, Oral, QID, Sherryll Burger, Vipul, MD, 0.5 mg at 07/20/20 1223   [START ON 07/21/2020] COVID-19 mRNA Vac-TriS (Pf[employer]) injection 0.3 mL, 0.3 mL, Intramuscular, ONCE-1600,  Delfino Lovett, MD   feeding supplement (ENSURE ENLIVE / ENSURE PLUS) liquid 237 mL, 237 mL, Oral, TID BM, Sherryll Burger, Vipul, MD, 237 mL at 07/20/20 1228   lactulose (CHRONULAC) 10 GM/15ML solution 10 g, 10 g, Oral, BID, Agbata, Tochukwu, MD, 10 g at 07/20/20 1224   ondansetron (ZOFRAN) tablet 4 mg, 4 mg, Oral, Q6H PRN **OR** ondansetron (ZOFRAN) injection 4 mg, 4 mg, Intravenous, Q6H PRN, Agbata, Tochukwu, MD, 4 mg at 07/20/20 0040   polyethylene  glycol (MIRALAX / GLYCOLAX) packet 17 g, 17 g, Oral, Daily, Sherryll Burger, Vipul, MD, 17 g at 07/19/20 0936   protein supplement (ENSURE MAX) liquid, 11 oz, Oral, BID, Delfino Lovett, MD, 11 oz at 07/19/20 1600   senna-docusate (Senokot-S) tablet 2 tablet, 2 tablet, Oral, BID, Delfino Lovett, MD, 2 tablet at 07/19/20 1610   sodium chloride flush (NS) 0.9 % injection 3 mL, 3 mL, Intravenous, Q12H, Agbata, Tochukwu, MD, 3 mL at 07/20/20 1000   sodium chloride flush (NS) 0.9 % injection 3 mL, 3 mL, Intravenous, PRN, Agbata, Tochukwu, MD   tiotropium (SPIRIVA) inhalation capsule ([facility] use ONLY) 18 mcg, 1 capsule, Inhalation, Daily, Agbata, Tochukwu, MD, 18 mcg at 07/20/20 1228   traZODone (DESYREL) tablet 50 mg, 50 mg, Oral, QHS, Delfino Lovett, MD, 50 mg at 07/19/20 2148    ALLERGIES   Sulfa antibiotics     REVIEW OF SYSTEMS    Review of Systems:  Gen:  Denies  fever, sweats, chills weigh loss  HEENT: Denies blurred vision, double vision, ear pain, eye pain, hearing loss, nose bleeds, sore throat Cardiac:  No dizziness, chest pain or heaviness, chest tightness,edema Resp:   Denies cough or sputum porduction, shortness of breath,wheezing, hemoptysis,  Gi: Denies swallowing difficulty, stomach pain, nausea or vomiting, diarrhea, constipation, bowel incontinence Gu:  Denies bladder incontinence, burning urine Ext:   Denies Joint pain, stiffness or swelling Skin: Denies  skin rash, easy bruising or bleeding or hives Endoc:  Denies polyuria, polydipsia , polyphagia or weight change Psych:   Denies depression, insomnia or hallucinations   Other:  All other systems negative   VS: BP 121/73 (BP Location: Left Arm)   Pulse 79   Temp (!) 97.5 F (36.4 C) (Oral)   Resp 18   Ht 5\' 6"  (1.676 m)   Wt 127 kg   SpO2 100%   BMI 45.19 kg/m      PHYSICAL EXAM    GENERAL:NAD, no fevers, chills, no weakness no fatigue HEAD: Normocephalic, atraumatic.  EYES: Pupils equal, round, reactive to light.  Extraocular muscles intact. No scleral icterus.  MOUTH: Moist mucosal membrane. Dentition intact. No abscess noted.  EAR, NOSE, THROAT: Clear without exudates. No external lesions.  NECK: Supple. No thyromegaly. No nodules. No JVD.  PULMONARY: mild rhonchi bilaterally  CARDIOVASCULAR: S1 and S2. Regular rate and rhythm. No murmurs, rubs, or gallops. No edema. Pedal pulses 2+ bilaterally.  GASTROINTESTINAL: Soft, nontender, nondistended. No masses. Positive bowel sounds. No hepatosplenomegaly.  MUSCULOSKELETAL: No swelling, clubbing, or edema. Range of motion full in all extremities.  NEUROLOGIC: Cranial nerves II through XII are intact. No gross focal neurological deficits. Sensation intact. Reflexes intact.  SKIN: No ulceration, lesions, rashes, or cyanosis. Skin warm and dry. Turgor intact.  PSYCHIATRIC: Mood, affect within normal limits. The patient is awake, alert and oriented x 3. Insight, judgment intact.       IMAGING    CT ABDOMEN PELVIS WO CONTRAST  Result Date: 07/17/2020 CLINICAL DATA:  Abdominal distension, shortness  of breath EXAM: CT ABDOMEN AND PELVIS WITHOUT CONTRAST TECHNIQUE: Multidetector CT imaging of the abdomen and pelvis was performed following the standard protocol without IV contrast. COMPARISON:  CT 05/31/2020 FINDINGS: Lower chest: Small right pleural effusion with associated compressive atelectasis. Left lung bases clear. Heart size is normal. Hepatobiliary: Subtle surface nodularity of the liver suggesting underlying cirrhosis. No focal liver lesion is identified on noncontrast study. Cholelithiasis without evidence to suggest cholecystitis. No biliary dilatation. Pancreas: Unremarkable. No pancreatic ductal dilatation or surrounding inflammatory changes. Spleen: Spleen is upper limits of normal in size. Adrenals/Urinary Tract: Unremarkable adrenal glands. Stable upper pole right renal cyst. Tiny punctate calcification within midpole of the left kidney which may  represent a vascular calcification or nonobstructing stone. No hydronephrosis. Urinary bladder is decompressed, limiting its evaluation. Stomach/Bowel: Stomach is within normal limits. Appendix not definitively identified. Colonic diverticulosis. No evidence of bowel wall thickening, distention, or inflammatory changes. Moderate volume stool throughout the colon Vascular/Lymphatic: Aortoiliac atherosclerosis with infrarenal abdominal aortic aneurysm measuring 5.7 cm in maximal transverse dimension (series 2, image 51) previously 5.1 cm. No abdominopelvic lymphadenopathy. Reproductive: Prostate is unremarkable. Other: Large volume ascites. No definite omental or peritoneal masses identified on noncontrast study. No pneumoperitoneum. Paraumbilical hernia containing small bowel without evidence of obstruction. Musculoskeletal: No acute or significant osseous findings. IMPRESSION: 1. Large volume ascites. 2. Subtle surface nodularity of the liver suggesting underlying cirrhosis. 3. Small right pleural effusion with associated compressive atelectasis. 4. Cholelithiasis without evidence to suggest cholecystitis. 5. Colonic diverticulosis without evidence of acute diverticulitis. 6. Paraumbilical hernia containing small bowel without evidence of obstruction. 7. 5.7 cm infrarenal abdominal aortic aneurysm, previously 5.1 cm. Recommend referral to a vascular specialist. This recommendation follows ACR consensus guidelines: White Paper of the ACR Incidental Findings Committee II on Vascular Findings. J Am Coll Radiol 2013; 10:789-794. 8. Aortic atherosclerosis (ICD10-I70.0). Electronically Signed   By: Duanne GuessNicholas  Plundo D.O.   On: 07/17/2020 11:07   CT ABDOMEN PELVIS W CONTRAST  Result Date: 07/19/2020 CLINICAL DATA:  Acute abdominal pain.  Hypotension. EXAM: CT ABDOMEN AND PELVIS WITH CONTRAST TECHNIQUE: Multidetector CT imaging of the abdomen and pelvis was performed using the standard protocol following bolus  administration of intravenous contrast. CONTRAST:  100mL OMNIPAQUE IOHEXOL 300 MG/ML  SOLN COMPARISON:  05/31/2020 and 07/17/2020 FINDINGS: Lower chest: Small right pleural effusion unchanged to slightly worse. Left lung is clear. Hepatobiliary: Somewhat small nodular liver likely due to cirrhosis. No focal liver mass. Evidence of cholelithiasis with cholesterol stone noted and unchanged. Biliary tree is unremarkable. Pancreas: Normal. Spleen: Normal. Adrenals/Urinary Tract: Adrenal glands are normal. Kidneys are normal in size without hydronephrosis. Right renal cyst unchanged. Ureters and bladder are normal. Stomach/Bowel: Stomach is unremarkable. There is an umbilical hernia containing a short segment of small bowel which is now incarcerated as there is interval development of dilatation of the small bowel loops immediately proximal to entering the hernia sac measuring 5 cm in diameter. Small bowel distal to the hernia sac is normal. Air is present throughout the colon. There is diverticulosis of the sigmoid colon. Vascular/Lymphatic: Calcified plaque over the abdominal aorta with moderate mural thrombus over the distal abdominal aorta. Again noted is patient's distal abdominal aortic aneurysm measuring 4.8 x 5.7 cm in AP and transverse dimension at the same level as measured previously (previously 4.3 x 5.7 cm). Greatest AP dimension is 5.1 cm which is unchanged. No adenopathy. Reproductive: Normal. Other: Moderate ascites without significant change. There are a few areas of soft tissue  nodularity associated with ascites concerning for possible peritoneal spread of neoplastic disease/malignant ascites. Musculoskeletal: Degenerative change of the spine and hips. IMPRESSION: 1. Umbilical hernia containing a short segment of small bowel which is now incarcerated as there is interval development of small bowel obstruction immediately proximal to the hernia sac measuring 5 cm in diameter. 2. Moderate ascites without  significant change. There are a few areas of soft tissue nodularity associated with ascites concerning for possible peritoneal spread of neoplastic disease/malignant ascites. Recommend correlation with known underlying neoplasm. 3. Stable distal abdominal aortic aneurysm measuring 4.8 x 5.7 cm in AP and transverse dimension as measured previously (previously 4.3 x 5.7 cm). Recommend referral to a vascular specialist if not already done. This recommendation follows ACR consensus guidelines: White Paper of the ACR Incidental Findings Committee II on Vascular Findings. J Am Coll Radiol 2013; 10:789-794. 4. Cholelithiasis. 5. Right renal cyst unchanged. 6. Colonic diverticulosis. 7. Aortic atherosclerosis. 8. Somewhat small nodular liver possibly due to cirrhosis. Aortic Atherosclerosis (ICD10-I70.0). These results were called by telephone at the time of interpretation on 07/19/2020 at 5:05 pm to provider Louisa Second , who verbally acknowledged these results. Electronically Signed   By: Elberta Fortis M.D.   On: 07/19/2020 17:03   US RENAL  Result Date: 07/19/2020 CLINICAL DATA:  AK I EXAM: RENAL / URINARY TRACT ULTRASOUND COMPLETE COMPARISON:  July 19, 2020 FINDINGS: Right Kidney: Renal measurements: 12.3 x 5.9 x 5.7 cm = volume: 215 mL. Echogenicity is within normal limits. There is a anechoic mass with posterior acoustic enhancement consistent with a benign cyst which measures 2.9 x 3.3 x 3.0 cm. No hydronephrosis. Left Kidney: Renal measurements: 12.0 x 4.4 x 5.4 cm = volume: 150 mL. Echogenicity is within normal limits. There is an echogenic focus consistent with a nonobstructive nephrolithiasis vs calcification as seen on prior CT. No hydronephrosis. Bladder: Appears normal for degree of bladder distention. Other: Incidental note of small to moderate volume ascites IMPRESSION: 1. No hydronephrosis. 2. Small to moderate volume ascites. Electronically Signed   By: Meda Klinefelter MD   On: 07/19/2020 15:52    US Paracentesis  Result Date: 07/17/2020 INDICATION: Recurrent ascites EXAM: ULTRASOUND GUIDED  PARACENTESIS MEDICATIONS: None. COMPLICATIONS: None immediate. PROCEDURE: Informed written consent was obtained from the patient after a discussion of the risks, benefits and alternatives to treatment. A timeout was performed prior to the initiation of the procedure. Initial ultrasound scanning demonstrates a large amount of ascites within the right lower abdominal quadrant. The right lower abdomen was prepped and draped in the usual sterile fashion. 1% lidocaine was used for local anesthesia. Following this, a 6 Fr Safe-T-Centesis catheter was introduced. An ultrasound image was saved for documentation purposes. The paracentesis was performed. The catheter was removed and a dressing was applied. The patient tolerated the procedure well without immediate post procedural complication. FINDINGS: A total of approximately 8 L of straw-colored clear fluid was removed. Samples were sent to the laboratory as requested by the clinical team. IMPRESSION: Successful ultrasound-guided paracentesis yielding 8 L liters of peritoneal fluid. Electronically Signed   By: Acquanetta Belling M.D.   On: 07/17/2020 17:14   DG Chest Portable 1 View  Result Date: 07/17/2020 CLINICAL DATA:  Shortness of breath. EXAM: PORTABLE CHEST 1 VIEW COMPARISON:  06/01/2020 FINDINGS: The heart size and mediastinal contours are within normal limits. Similar chronic interstitial coarsening within the lower lung zones. Both lungs are clear. The visualized skeletal structures are unremarkable. IMPRESSION: No active cardiopulmonary abnormalities.  Electronically Signed   By: Signa Kell M.D.   On: 07/17/2020 10:42   US ABDOMEN LIMITED RUQ (LIVER/GB)  Result Date: 07/17/2020 CLINICAL DATA:  Transaminitis. EXAM: ULTRASOUND ABDOMEN LIMITED RIGHT UPPER QUADRANT COMPARISON:  CT of same day. FINDINGS: Gallbladder: Cholelithiasis and sludge is noted. No  gallbladder wall thickening is noted. No sonographic Murphy's sign is noted. Common bile duct: Diameter: 6 mm which is within normal limits. Liver: No focal lesion identified. Increased echogenicity of hepatic parenchyma is noted with nodular contours suggesting hepatic cirrhosis. Portal vein is patent on color Doppler imaging with normal direction of blood flow towards the liver. Other: Moderate ascites is noted. IMPRESSION: Findings consistent with hepatic cirrhosis. No definite focal sonographic hepatic abnormality is noted. Cholelithiasis and sludge is noted without definite evidence of cholecystitis. Moderate ascites. Electronically Signed   By: Lupita Raider M.D.   On: 07/17/2020 12:39      ASSESSMENT/PLAN   Pulmonary preoperative evaluation-vascular surgery for abdominal aortic aneurysm 1.ARISCAT - (CANET) -PREOPERATIVE PULMONARY RISK INDEX IN ADULTS : -13.3%-moderate risk of any pulmonary complication including worsening atelectasis, increased O2 requirement, increased length of stay.  This is due to abdominal location of surgery, patient's age, baseline hypoxemia and chronic lung disease 2.AROZULLAH RESPIRATORY FAILURE INDEX -10.1%-moderate risk of postoperative respiratory failure-this is due to age, history of chronic lung disease, BUN over 30, abdominal aortic aneurysm surgery 3. GUPTA surgical risk calculator for postoperative respiratory failure (PRF)-13.3 moderate risk -due to AAA procedure, and comorbid functional state   Advanced COPD with chronic hypoxemia  -Currently on COPD care path with nebulizer every 6 as needed with albuterol with home Spiriva once daily also empirically being covered with Rocephin -Encourage use of incentive spirometer -PT OT as able -avoid sedating medications/avoid centrally acting medication   Pleural effusions -Status post paracentesis this should also help reduce effusions, recommend incentive spirometer and consider trial of diuresis however  patient is noted with AKI   Bibasilar atelectasis -Patient should be encouraged to maximize physical therapy while inpatient as well as utilize flutter valve and incentive spirometry several times each hour especially postoperatively   Obstructive sleep apnea -Nightly CPAP settings per RT-symptoms tend to be worse postoperatively please encourage patient to comply with CPAP device    Acute kidney injury stage II Contributing to fluid balance impairment and effusion/ascites formation -Consider renal evaluation to optimize function prior to vascular surgery as this may get worse if patient has significant blood loss and transient decreased perfusion   Severe abdominal ascites Status post paracentesis with improvement -Profile suggestive of SBT and is on Rocephin                  -Fluid count with >3k nucleated cells at 7% PMN with elevated LDH     -agree with IV Rocephin   Anxiety NOS and insomnia Patient on multiple centrally acting medications which may together further hinder respiratory drive in the context of advanced chronic lung disease chronic hypoxemia and obstructive sleep apnea.   Severe hyponatremia-improving Hypervolemic recommend evaluation with nephrology and nonrapid correction -urine /serum osm, total cholesterol, TSH, ACTH, cortisol   Abdominal aortic aneurysm without rupture --Vascular surgery on case-plan for repair undergoing clearance due to significant comorbid status with multiple provider evaluations in progress EVAR surgery for rapidly growing urgent AAA.      Thank you for allowing me to participate in the care of this patient.   Total face to face encounter time for this patient visit was . >  50% of the time was  spent in counseling and coordination of care.    Patient/Family are satisfied with care plan and all questions have been answered.  This document was prepared using Dragon voice recognition software and may include unintentional  dictation errors.     Vida Rigger, M.D.  Division of Pulmonary & Critical Care Medicine  [facility]

## 2020-07-20 NOTE — Progress Notes (Signed)
[facility] SURGICAL PROGRESS NOTE (cpt [phone])  Hospital Day(s): 3.   Interval History: Patient seen and examined, no acute events or new complaints overnight. Patient reports he is feeling better this morning after hernia was able to be reduced at bedside. His abdomen remains protuberant at baseline secondary to cirrhosis. No fever, chills, nausea, emesis. Previously seen leukocytosis improved to 11.3K. Renal function normalized, sCr - 1.02; UO - 950 ccs. Hyponatremia to 129. He was made NPO at midnight as a precaution. He continues to have flatus and bowel movements this morning.   Review of Systems:  Constitutional: denies fever, chills  HEENT: denies cough or congestion  Respiratory: denies any shortness of breath  Cardiovascular: denies chest pain or palpitations  Gastrointestinal: denies abdominal pain (resolved), denied  N/V, or diarrhea/and bowel function as per interval history Genitourinary: denies burning with urination or urinary frequency  Vital signs in last 24 hours: [min-max] current  Temp:  [97.5 F (36.4 C)-97.8 F (36.6 C)] 97.8 F (36.6 C) (06/13 0829) Pulse Rate:  [67-84] 84 (06/13 0829) Resp:  [18-22] 18 (06/13 0829) BP: (87-120)/(61-82) 113/61 (06/13 0829) SpO2:  [93 %-99 %] 98 % (06/13 0829)     Height: 5\' 6"  (167.6 cm) Weight: 127 kg BMI (Calculated): 45.21   Intake/Output last 2 shifts:  06/12 0701 - 06/13 0700 In: 313.4 [I.V.:12.3; IV Piggyback:301] Out: 950 [Urine:950]   Physical Exam:  Constitutional: alert, cooperative and no distress  HENT: normocephalic without obvious abnormality  Eyes: PERRL, EOM's grossly intact and symmetric  Respiratory: breathing non-labored at rest, on [place] Cardiovascular: regular rate and sinus rhythm  Gastrointestinal: Protuberant consistent with cirrhosis, non-tender, no rebound/guarding, umbilical hernia remains reduced this morning   Labs:  CBC Latest Ref Rng & Units 07/20/2020 07/19/2020 07/18/2020  WBC  4.0 - 10.5 K/uL 11.3(H) 13.3(H) 16.2(H)  Hemoglobin 13.0 - 17.0 g/dL 09/17/2020 79.0 24.0  Hematocrit 39.0 - 52.0 % 43.4 43.5 47.7  Platelets 150 - 400 K/uL 325 355 431(H)   CMP Latest Ref Rng & Units 07/20/2020 07/19/2020 07/18/2020  Glucose 70 - 99 mg/dL 09/17/2020) 532(D) 924(Q)  BUN 8 - 23 mg/dL 683(M) 19(Q) 22(W)  Creatinine 0.61 - 1.24 mg/dL 97(L 8.92) 1.19(E)  Sodium 135 - 145 mmol/L 129(L) 129(L) 129(L)  Potassium 3.5 - 5.1 mmol/L 3.7 4.2 3.9  Chloride 98 - 111 mmol/L 80(L) 77(L) 77(L)  CO2 22 - 32 mmol/L 38(H) 39(H) 37(H)  Calcium 8.9 - 10.3 mg/dL 1.74(Y) 8.1(K) 9.1  Total Protein 6.5 - 8.1 g/dL 6.7 - -  Total Bilirubin 0.3 - 1.2 mg/dL 4.8(J) - -  Alkaline Phos 38 - 126 U/L 104 - -  AST 15 - 41 U/L 58(H) - -  ALT 0 - 44 U/L 139(H) - -     Imaging studies: No new pertinent imaging studies   Assessment/Plan: (ICD-10's: K42.0) 65 y.o. male with, reduced, umbilical hernia with resultant, now resolved, bowel obstruction, complicated by history of cirrhosis   - No plans for surgical intervention regarding his hernia at this time. On admission he is a Childs C (82% peri-operative mortality). At this time, I agree that the risk of elective repair far outweigh the benefit. I reviewed this in detail with the patient and his sister at bedside, which they were understanding of. Should his hernia/incarceration recur and be unable to be reduced at bedside, then we would need to consider interventions at that time    - Okay to resume diet - Monitor abdominal examination; on-going bowel  function   - Pain control prn; antiemetics prn - Bowel regimen as needed   - Further management per primary service  - We will sign off but be available as needed    All of the above findings and recommendations were discussed with the patient, patient's family (sister at bedside), and the medical team, and all of patient's and family's questions were answered to their expressed satisfaction.  -- Lynden Oxford,  PA-C [facility] 07/20/2020, 10:03 AM [phone] M-F: 7am - 4pm

## 2020-07-21 LAB — COMPREHENSIVE METABOLIC PANEL
ALT: 115 U/L — ABNORMAL HIGH (ref 0–44)
AST: 56 U/L — ABNORMAL HIGH (ref 15–41)
Albumin: 3.4 g/dL — ABNORMAL LOW (ref 3.5–5.0)
Alkaline Phosphatase: 102 U/L (ref 38–126)
Anion gap: 8 (ref 5–15)
BUN: 28 mg/dL — ABNORMAL HIGH (ref 8–23)
CO2: 39 mmol/L — ABNORMAL HIGH (ref 22–32)
Calcium: 8.8 mg/dL — ABNORMAL LOW (ref 8.9–10.3)
Chloride: 82 mmol/L — ABNORMAL LOW (ref 98–111)
Creatinine, Ser: 0.76 mg/dL (ref 0.61–1.24)
GFR, Estimated: >60 mL/min (ref >60)
Glucose, Bld: 117 mg/dL — ABNORMAL HIGH (ref 70–99)
Potassium: 3.1 mmol/L — ABNORMAL LOW (ref 3.5–5.1)
Sodium: 129 mmol/L — ABNORMAL LOW (ref 135–145)
Total Bilirubin: 1.5 mg/dL — ABNORMAL HIGH (ref 0.3–1.2)
Total Protein: 6.6 g/dL (ref 6.5–8.1)

## 2020-07-21 LAB — CBC
HCT: 42.2 % (ref 39.0–52.0)
Hemoglobin: 13.8 g/dL (ref 13.0–17.0)
MCH: 28.4 pg (ref 26.0–34.0)
MCHC: 32.7 g/dL (ref 30.0–36.0)
MCV: 86.8 fL (ref 80.0–100.0)
Platelets: 319 10*3/uL (ref 150–400)
RBC: 4.86 MIL/uL (ref 4.22–5.81)
RDW: 14.6 % (ref 11.5–15.5)
WBC: 11.6 10*3/uL — ABNORMAL HIGH (ref 4.0–10.5)
nRBC: 0 % (ref 0.0–0.2)

## 2020-07-21 LAB — ACTH: C206 ACTH: 27.4 pg/mL (ref 7.2–63.3)

## 2020-07-21 LAB — BODY FLUID CULTURE W GRAM STAIN
Culture: NO GROWTH
Gram Stain: NONE SEEN

## 2020-07-21 LAB — CYTOLOGY - NON PAP

## 2020-07-21 MED ORDER — POTASSIUM CHLORIDE CRYS ER 20 MEQ PO TBCR
40.0000 meq | EXTENDED_RELEASE_TABLET | Freq: Once | ORAL | Status: AC
  Administered 2020-07-21: 40 meq via ORAL
  Filled 2020-07-21: qty 2

## 2020-07-21 NOTE — Progress Notes (Signed)
Physical Therapy Treatment Patient Details Name: Seth Haley MRN: [id number] DOB: 1955/03/06 Today's Date: 07/21/2020    History of Present Illness Seth Haley is a 65 y.o. 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  Patient was recently hospitalized about a month ago and had paracentesis done at the time.  CT scan noncontrasted of the abdomen and pelvis obtained today to evaluate his ascites shows an AAA that measures 5. 7 cm    PT Comments    Patient received in bed, agreeable to PT session, but required increased time to get motivated. Sister present and encouraging. Patient is mod independent with bed mobility, transfers with supervision. Ambulates with supervision for safety. Patient is moderately limited with mobility due to weakness, fatigue and sob. Requires seated rest after ambulating 20 feet. Poor activity tolerance and will continue to benefit from skilled PT while here to improve strength, activity tolerance and safety with mobility. Planning for AAA repair tomorrow per notes. Will likely need re-assessment after surgery.      Follow Up Recommendations  SNF     Equipment Recommendations  None recommended by PT    Recommendations for Other Services       Precautions / Restrictions Precautions Precaution Comments: mod fall Restrictions Weight Bearing Restrictions: No    Mobility  Bed Mobility Overal bed mobility: Modified Independent                  Transfers Overall transfer level: Needs assistance Equipment used: None Transfers: Sit to/from Stand Sit to Stand: Supervision            Ambulation/Gait Ambulation/Gait assistance:  Supervision Gait Distance (Feet): 40 Feet Assistive device: None Gait Pattern/deviations: Step-through pattern Gait velocity: WNL   General Gait Details: patient is generally safe with ambulation, some concern about O2 line safety, no lob. No ad.  Mobility limited by poor endurance and sob with activity. Required seated rest between 2 bouts of 20 feet of ambulation for a total of 40 feet.   Stairs             Wheelchair Mobility    Modified Rankin (Stroke Patients Only)       Balance Overall balance assessment: Mild deficits observed, not formally tested                                          Cognition Arousal/Alertness: Awake/alert Behavior During Therapy: WFL for tasks assessed/performed Overall Cognitive Status: Within Functional Limits for tasks assessed                                 General Comments: Pt needs increased time to process, initiate movement      Exercises      General Comments        Pertinent Vitals/Pain Pain Assessment: No/denies pain    Home Living                      Prior Function            PT Goals (current goals  can now be found in the care plan section) Acute Rehab PT Goals Patient Stated Goal: to get some rehab PT Goal Formulation: With patient/family Time For Goal Achievement: 08/03/20 Potential to Achieve Goals: Good Progress towards PT goals: Progressing toward goals    Frequency    Min 2X/week      PT Plan Current plan remains appropriate    Co-evaluation              AM-PAC PT "6 Clicks" Mobility   Outcome Measure  Help needed turning from your back to your side while in a flat bed without using bedrails?: None Help needed moving from lying on your back to sitting on the side of a flat bed without using bedrails?: None Help needed moving to and from a bed to a chair (including a wheelchair)?: A Little Help needed standing up from a chair using your arms  (e.g., wheelchair or bedside chair)?: A Little Help needed to walk in hospital room?: A Little Help needed climbing 3-5 steps with a railing? : A Little 6 Click Score: 20    End of Session Equipment Utilized During Treatment: Oxygen Activity Tolerance: Patient limited by fatigue;Other (comment) (sob) Patient left: in bed;with call bell/phone within reach;with family/visitor present Nurse Communication: Mobility status PT Visit Diagnosis: Muscle weakness (generalized) (M62.81);Difficulty in walking, not elsewhere classified (R26.2)     Time: 1610-9604 PT Time Calculation (min) (ACUTE ONLY): 16 min  Charges:  $Gait Training: 8-22 mins                    [employer], PT, GCS 07/21/20,11:52 AM

## 2020-07-21 NOTE — Progress Notes (Signed)
Pulmonary and Critical Care          Date: 07/21/2020,   MRN# [id number] Jaiveon Suppes [facility] [birth year]     AdmissionWeight: 127 kg                 CurrentWeight: 127 kg   Referring physician: Dr Feliberto Gottron   CHIEF COMPLAINT:   Pulmonary preoperative evaluation for vascular surgery.   HISTORY OF PRESENT ILLNESS   This is a pleasant 65 year old male with a history of COPD, dyslipidemia and previous umbilical hernia, he has chronic hypoxemia and uses supplemental oxygen at home at 2 L/min nasal cannula, also has significant history with liver cirrhosis and portal hypertension as well as splenomegaly and actually came in on this admission with abdominal discomfort swelling and distention.  Associated with abdominal discomfort is worsening shortness of breath.  In the emergency room family had shared that patient has been declining clinically and has become physically deconditioned with severe fatigue and malaise.  His lab work is significant for severe hyponatremia in the 120s, renal failure with creatinine 1.3 and transaminitis.  Liver synthetic function including INR and albumin are within reference range.  Patient had CT of the abdomen performed which showed large volume ascites, right pleural effusion and compressive atelectasis, diverticulosis periumbilical hernia containing small bowel, and a significant 5.7 cm infrarenal AAA which was previously 5.1 cm.  Pulmonary consultation placed for additional evaluation and risk stratification for vascular surgery AAA repair. Sister at bedside we rewviewed care plan together with patient.  Patient generally sees pulmonary at [facility] with Dr Raul Del.     07/20/20- patient reports improvement clinically , he seems somewhat sedated but calm,  he shares he recently started Klonopin due to anxiety associated with advanced COPD and severe dyspnea.    07/21/20- patient seen and evaluated at bedside.  Patient has met with palliative care team and had  goal of care done. He shares he really is considering comfort measures and is in communication with sister to discuss what he wants.  He said to me he does not want to have any surgeries.  His sister joined conversation via phone and she is making medical decisions.  She said to me that she feels surgery is not going to change the fact that patient should be in hospice.    PAST MEDICAL HISTORY   Past Medical History:  Diagnosis Date   COPD (chronic obstructive pulmonary disease) ([place])    Hypertension    Umbilical hernia      SURGICAL HISTORY   History reviewed. No pertinent surgical history.   FAMILY HISTORY   Family History  Problem Relation Age of Onset   Hypertension Mother      SOCIAL HISTORY   Social History   Tobacco Use   Smoking status: Former    Pack years: 0.00   Smokeless tobacco: Never  Substance Use Topics   Alcohol use: Not Currently   Drug use: Not Currently     MEDICATIONS    Home Medication:    Current Medication:  Current Facility-Administered Medications:    0.9 %  sodium chloride infusion, 250 mL, Intravenous, PRN, Agbata, Tochukwu, MD, Stopped at 07/20/20 2323   albumin human 25 % solution 25 g, 25 g, Intravenous, Q8H, Lateef, Munsoor, MD, Last Rate: 60 mL/hr at 07/21/20 0321, Infusion Verify at 07/21/20 0321   albuterol (PROVENTIL) (2.5 MG/3ML) 0.083% nebulizer solution 3 mL, 3 mL, Inhalation, Q6H PRN, Agbata, Tochukwu, MD, 3 mL at 07/19/20 0211  alum & mag hydroxide-simeth (MAALOX/MYLANTA) 200-200-20 MG/5ML suspension 30 mL, 30 mL, Oral, Q6H PRN, Max Sane, MD   bisacodyl (DULCOLAX) EC tablet 10 mg, 10 mg, Oral, Daily, Max Sane, MD, 10 mg at 07/19/20 0936   clonazePAM (KLONOPIN) disintegrating tablet 0.5 mg, 0.5 mg, Oral, QID, Max Sane, MD, 0.5 mg at 07/20/20 2016   COVID-19 mRNA Vac-TriS (Pfizer) injection 0.3 mL, 0.3 mL, Intramuscular, ONCE-1600, Manuella Ghazi, Vipul, MD   feeding supplement (ENSURE ENLIVE / ENSURE PLUS) liquid 237 mL,  237 mL, Oral, TID BM, Manuella Ghazi, Vipul, MD, 237 mL at 07/20/20 2016   lactulose (CHRONULAC) 10 GM/15ML solution 10 g, 10 g, Oral, BID, Agbata, Tochukwu, MD, 10 g at 07/20/20 2314   ondansetron (ZOFRAN) tablet 4 mg, 4 mg, Oral, Q6H PRN **OR** ondansetron (ZOFRAN) injection 4 mg, 4 mg, Intravenous, Q6H PRN, Agbata, Tochukwu, MD, 4 mg at 07/20/20 0040   polyethylene glycol (MIRALAX / GLYCOLAX) packet 17 g, 17 g, Oral, Daily, Manuella Ghazi, Vipul, MD, 17 g at 07/19/20 0936   potassium chloride SA (KLOR-CON) CR tablet 40 mEq, 40 mEq, Oral, Once, Sreenath, Sudheer B, MD   protein supplement (ENSURE MAX) liquid, 11 oz, Oral, BID, Max Sane, MD, 11 oz at 07/19/20 1600   senna-docusate (Senokot-S) tablet 2 tablet, 2 tablet, Oral, BID, Max Sane, MD, 2 tablet at 07/19/20 3729   sodium chloride flush (NS) 0.9 % injection 3 mL, 3 mL, Intravenous, Q12H, Agbata, Tochukwu, MD, 3 mL at 07/20/20 2324   sodium chloride flush (NS) 0.9 % injection 3 mL, 3 mL, Intravenous, PRN, Agbata, Tochukwu, MD   tiotropium (SPIRIVA) inhalation capsule ([facility] use ONLY) 18 mcg, 1 capsule, Inhalation, Daily, Agbata, Tochukwu, MD, 18 mcg at 07/20/20 1228   traZODone (DESYREL) tablet 50 mg, 50 mg, Oral, QHS, Max Sane, MD, 50 mg at 07/20/20 2314    ALLERGIES   Sulfa antibiotics     REVIEW OF SYSTEMS    Review of Systems:  Gen:  Denies  fever, sweats, chills weigh loss  HEENT: Denies blurred vision, double vision, ear pain, eye pain, hearing loss, nose bleeds, sore throat Cardiac:  No dizziness, chest pain or heaviness, chest tightness,edema Resp:   Denies cough or sputum porduction, shortness of breath,wheezing, hemoptysis,  Gi: Denies swallowing difficulty, stomach pain, nausea or vomiting, diarrhea, constipation, bowel incontinence Gu:  Denies bladder incontinence, burning urine Ext:   Denies Joint pain, stiffness or swelling Skin: Denies  skin rash, easy bruising or bleeding or hives Endoc:  Denies polyuria, polydipsia ,  polyphagia or weight change Psych:   Denies depression, insomnia or hallucinations   Other:  All other systems negative   VS: BP 122/84 (BP Location: Left Arm)   Pulse 91   Temp (!) 97.5 F (36.4 C) (Oral)   Resp 20   Ht 5' 6" (1.676 m)   Wt 127 kg   SpO2 96%   BMI 45.19 kg/m      PHYSICAL EXAM    GENERAL:NAD, no fevers, chills, no weakness no fatigue HEAD: Normocephalic, atraumatic.  EYES: Pupils equal, round, reactive to light. Extraocular muscles intact. No scleral icterus.  MOUTH: Moist mucosal membrane. Dentition intact. No abscess noted.  EAR, NOSE, THROAT: Clear without exudates. No external lesions.  NECK: Supple. No thyromegaly. No nodules. No JVD.  PULMONARY: mild rhonchi bilaterally  CARDIOVASCULAR: S1 and S2. Regular rate and rhythm. No murmurs, rubs, or gallops. No edema. Pedal pulses 2+ bilaterally.  GASTROINTESTINAL: Soft, nontender, nondistended. No masses. Positive bowel sounds. No hepatosplenomegaly.  MUSCULOSKELETAL: No swelling, clubbing, or edema. Range of motion full in all extremities.  NEUROLOGIC: Cranial nerves II through XII are intact. No gross focal neurological deficits. Sensation intact. Reflexes intact.  SKIN: No ulceration, lesions, rashes, or cyanosis. Skin warm and dry. Turgor intact.  PSYCHIATRIC: Mood, affect within normal limits. The patient is awake, alert and oriented x 3. Insight, judgment intact.       IMAGING    CT ABDOMEN PELVIS WO CONTRAST  Result Date: 07/17/2020 CLINICAL DATA:  Abdominal distension, shortness of breath EXAM: CT ABDOMEN AND PELVIS WITHOUT CONTRAST TECHNIQUE: Multidetector CT imaging of the abdomen and pelvis was performed following the standard protocol without IV contrast. COMPARISON:  CT 05/31/2020 FINDINGS: Lower chest: Small right pleural effusion with associated compressive atelectasis. Left lung bases clear. Heart size is normal. Hepatobiliary: Subtle surface nodularity of the liver suggesting underlying  cirrhosis. No focal liver lesion is identified on noncontrast study. Cholelithiasis without evidence to suggest cholecystitis. No biliary dilatation. Pancreas: Unremarkable. No pancreatic ductal dilatation or surrounding inflammatory changes. Spleen: Spleen is upper limits of normal in size. Adrenals/Urinary Tract: Unremarkable adrenal glands. Stable upper pole right renal cyst. Tiny punctate calcification within midpole of the left kidney which may represent a vascular calcification or nonobstructing stone. No hydronephrosis. Urinary bladder is decompressed, limiting its evaluation. Stomach/Bowel: Stomach is within normal limits. Appendix not definitively identified. Colonic diverticulosis. No evidence of bowel wall thickening, distention, or inflammatory changes. Moderate volume stool throughout the colon Vascular/Lymphatic: Aortoiliac atherosclerosis with infrarenal abdominal aortic aneurysm measuring 5.7 cm in maximal transverse dimension (series 2, image 51) previously 5.1 cm. No abdominopelvic lymphadenopathy. Reproductive: Prostate is unremarkable. Other: Large volume ascites. No definite omental or peritoneal masses identified on noncontrast study. No pneumoperitoneum. Paraumbilical hernia containing small bowel without evidence of obstruction. Musculoskeletal: No acute or significant osseous findings. IMPRESSION: 1. Large volume ascites. 2. Subtle surface nodularity of the liver suggesting underlying cirrhosis. 3. Small right pleural effusion with associated compressive atelectasis. 4. Cholelithiasis without evidence to suggest cholecystitis. 5. Colonic diverticulosis without evidence of acute diverticulitis. 6. Paraumbilical hernia containing small bowel without evidence of obstruction. 7. 5.7 cm infrarenal abdominal aortic aneurysm, previously 5.1 cm. Recommend referral to a vascular specialist. This recommendation follows ACR consensus guidelines: White Paper of the ACR Incidental Findings Committee II  on Vascular Findings. J Am Coll Radiol 2013; 10:789-794. 8. Aortic atherosclerosis (ICD10-I70.0). Electronically Signed   By: Davina Poke D.O.   On: 07/17/2020 11:07   CT ABDOMEN PELVIS W CONTRAST  Result Date: 07/19/2020 CLINICAL DATA:  Acute abdominal pain.  Hypotension. EXAM: CT ABDOMEN AND PELVIS WITH CONTRAST TECHNIQUE: Multidetector CT imaging of the abdomen and pelvis was performed using the standard protocol following bolus administration of intravenous contrast. CONTRAST:  170m OMNIPAQUE IOHEXOL 300 MG/ML  SOLN COMPARISON:  05/31/2020 and 07/17/2020 FINDINGS: Lower chest: Small right pleural effusion unchanged to slightly worse. Left lung is clear. Hepatobiliary: Somewhat small nodular liver likely due to cirrhosis. No focal liver mass. Evidence of cholelithiasis with cholesterol stone noted and unchanged. Biliary tree is unremarkable. Pancreas: Normal. Spleen: Normal. Adrenals/Urinary Tract: Adrenal glands are normal. Kidneys are normal in size without hydronephrosis. Right renal cyst unchanged. Ureters and bladder are normal. Stomach/Bowel: Stomach is unremarkable. There is an umbilical hernia containing a short segment of small bowel which is now incarcerated as there is interval development of dilatation of the small bowel loops immediately proximal to entering the hernia sac measuring 5 cm in diameter. Small bowel distal to the hernia  sac is normal. Air is present throughout the colon. There is diverticulosis of the sigmoid colon. Vascular/Lymphatic: Calcified plaque over the abdominal aorta with moderate mural thrombus over the distal abdominal aorta. Again noted is patient's distal abdominal aortic aneurysm measuring 4.8 x 5.7 cm in AP and transverse dimension at the same level as measured previously (previously 4.3 x 5.7 cm). Greatest AP dimension is 5.1 cm which is unchanged. No adenopathy. Reproductive: Normal. Other: Moderate ascites without significant change. There are a few areas of  soft tissue nodularity associated with ascites concerning for possible peritoneal spread of neoplastic disease/malignant ascites. Musculoskeletal: Degenerative change of the spine and hips. IMPRESSION: 1. Umbilical hernia containing a short segment of small bowel which is now incarcerated as there is interval development of small bowel obstruction immediately proximal to the hernia sac measuring 5 cm in diameter. 2. Moderate ascites without significant change. There are a few areas of soft tissue nodularity associated with ascites concerning for possible peritoneal spread of neoplastic disease/malignant ascites. Recommend correlation with known underlying neoplasm. 3. Stable distal abdominal aortic aneurysm measuring 4.8 x 5.7 cm in AP and transverse dimension as measured previously (previously 4.3 x 5.7 cm). Recommend referral to a vascular specialist if not already done. This recommendation follows ACR consensus guidelines: White Paper of the ACR Incidental Findings Committee II on Vascular Findings. J Am Coll Radiol 2013; 10:789-794. 4. Cholelithiasis. 5. Right renal cyst unchanged. 6. Colonic diverticulosis. 7. Aortic atherosclerosis. 8. Somewhat small nodular liver possibly due to cirrhosis. Aortic Atherosclerosis (ICD10-I70.0). These results were called by telephone at the time of interpretation on 07/19/2020 at 5:05 pm to provider Elmore Guise , who verbally acknowledged these results. Electronically Signed   By: Marin Olp M.D.   On: 07/19/2020 17:03   US RENAL  Result Date: 07/19/2020 CLINICAL DATA:  AK I EXAM: RENAL / URINARY TRACT ULTRASOUND COMPLETE COMPARISON:  July 19, 2020 FINDINGS: Right Kidney: Renal measurements: 12.3 x 5.9 x 5.7 cm = volume: 215 mL. Echogenicity is within normal limits. There is a anechoic mass with posterior acoustic enhancement consistent with a benign cyst which measures 2.9 x 3.3 x 3.0 cm. No hydronephrosis. Left Kidney: Renal measurements: 12.0 x 4.4 x 5.4 cm =  volume: 150 mL. Echogenicity is within normal limits. There is an echogenic focus consistent with a nonobstructive nephrolithiasis vs calcification as seen on prior CT. No hydronephrosis. Bladder: Appears normal for degree of bladder distention. Other: Incidental note of small to moderate volume ascites IMPRESSION: 1. No hydronephrosis. 2. Small to moderate volume ascites. Electronically Signed   By: Valentino Saxon MD   On: 07/19/2020 15:52   US Paracentesis  Result Date: 07/17/2020 INDICATION: Recurrent ascites EXAM: ULTRASOUND GUIDED  PARACENTESIS MEDICATIONS: None. COMPLICATIONS: None immediate. PROCEDURE: Informed written consent was obtained from the patient after a discussion of the risks, benefits and alternatives to treatment. A timeout was performed prior to the initiation of the procedure. Initial ultrasound scanning demonstrates a large amount of ascites within the right lower abdominal quadrant. The right lower abdomen was prepped and draped in the usual sterile fashion. 1% lidocaine was used for local anesthesia. Following this, a 6 Fr Safe-T-Centesis catheter was introduced. An ultrasound image was saved for documentation purposes. The paracentesis was performed. The catheter was removed and a dressing was applied. The patient tolerated the procedure well without immediate post procedural complication. FINDINGS: A total of approximately 8 L of straw-colored clear fluid was removed. Samples were sent to the laboratory as requested  by the clinical team. IMPRESSION: Successful ultrasound-guided paracentesis yielding 8 L liters of peritoneal fluid. Electronically Signed   By: Miachel Roux M.D.   On: 07/17/2020 17:14   DG Chest Portable 1 View  Result Date: 07/17/2020 CLINICAL DATA:  Shortness of breath. EXAM: PORTABLE CHEST 1 VIEW COMPARISON:  06/01/2020 FINDINGS: The heart size and mediastinal contours are within normal limits. Similar chronic interstitial coarsening within the lower lung  zones. Both lungs are clear. The visualized skeletal structures are unremarkable. IMPRESSION: No active cardiopulmonary abnormalities. Electronically Signed   By: Kerby Moors M.D.   On: 07/17/2020 10:42   US ABDOMEN LIMITED RUQ (LIVER/GB)  Result Date: 07/17/2020 CLINICAL DATA:  Transaminitis. EXAM: ULTRASOUND ABDOMEN LIMITED RIGHT UPPER QUADRANT COMPARISON:  CT of same day. FINDINGS: Gallbladder: Cholelithiasis and sludge is noted. No gallbladder wall thickening is noted. No sonographic Murphy's sign is noted. Common bile duct: Diameter: 6 mm which is within normal limits. Liver: No focal lesion identified. Increased echogenicity of hepatic parenchyma is noted with nodular contours suggesting hepatic cirrhosis. Portal vein is patent on color Doppler imaging with normal direction of blood flow towards the liver. Other: Moderate ascites is noted. IMPRESSION: Findings consistent with hepatic cirrhosis. No definite focal sonographic hepatic abnormality is noted. Cholelithiasis and sludge is noted without definite evidence of cholecystitis. Moderate ascites. Electronically Signed   By: Marijo Conception M.D.   On: 07/17/2020 12:39      ASSESSMENT/PLAN   Pulmonary preoperative evaluation-vascular surgery for abdominal aortic aneurysm 1.ARISCAT - (CANET) -PREOPERATIVE PULMONARY RISK INDEX IN ADULTS : -13.3%-moderate risk of any pulmonary complication including worsening atelectasis, increased O2 requirement, increased length of stay.  This is due to abdominal location of surgery, patient's age, baseline hypoxemia and chronic lung disease 2.[place] RESPIRATORY FAILURE INDEX -10.1%-moderate risk of postoperative respiratory failure-this is due to age, history of chronic lung disease, BUN over 30, abdominal aortic aneurysm surgery 3. GUPTA surgical risk calculator for postoperative respiratory failure (PRF)-13.3 moderate risk -due to AAA procedure, and comorbid functional state   Advanced COPD with chronic  hypoxemia  -Currently on COPD care path with nebulizer every 6 as needed with albuterol with home Spiriva once daily also empirically being covered with Rocephin -Encourage use of incentive spirometer -PT OT as able -avoid sedating medications/avoid centrally acting medication   Pleural effusions -Status post paracentesis this should also help reduce effusions, recommend incentive spirometer and consider trial of diuresis however patient is noted with AKI   Bibasilar atelectasis -Patient should be encouraged to maximize physical therapy while inpatient as well as utilize flutter valve and incentive spirometry several times each hour especially postoperatively   Obstructive sleep apnea -Nightly CPAP settings per RT-symptoms tend to be worse postoperatively please encourage patient to comply with CPAP device    Acute kidney injury stage II Contributing to fluid balance impairment and effusion/ascites formation -Consider renal evaluation to optimize function prior to vascular surgery as this may get worse if patient has significant blood loss and transient decreased perfusion   Severe abdominal ascites Status post paracentesis with improvement -Profile suggestive of SBT and is on Rocephin                  -Fluid count with >3k nucleated cells at 7% PMN with elevated LDH     -agree with IV Rocephin   Anxiety NOS and insomnia Patient on multiple centrally acting medications which may together further hinder respiratory drive in the context of advanced chronic lung disease chronic hypoxemia  and obstructive sleep apnea.   Severe hyponatremia-improving Hypervolemic recommend evaluation with nephrology and nonrapid correction -urine /serum osm, total cholesterol, TSH, ACTH, cortisol   Abdominal aortic aneurysm without rupture --Vascular surgery on case-plan for repair undergoing clearance due to significant comorbid status with multiple provider evaluations in progress EVAR surgery  for rapidly growing urgent AAA.      Thank you for allowing me to participate in the care of this patient.    Patient/Family are satisfied with care plan and all questions have been answered.  This document was prepared using Dragon voice recognition software and may include unintentional dictation errors.     Ottie Glazier, M.D.  Division of [place]

## 2020-07-21 NOTE — Progress Notes (Signed)
PROGRESS NOTE    Seth Haley  KYH:[id number] DOB: 17-Apr-1955 DOA: 07/17/2020 PCP: Simonne Martinet, NP    Brief Narrative:  65 y.o. 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  Patient was recently hospitalized about a month ago and had paracentesis done at the time.   Assessment & Plan:   Principal Problem:   SBP (spontaneous bacterial peritonitis) (HCC) Active Problems:   Essential hypertension   COPD (chronic obstructive pulmonary disease) (HCC)   Obesity, Class III, BMI 40-49.9 (morbid obesity) (HCC)   Cirrhosis of liver with ascites (HCC)   Chronic respiratory failure (HCC)   Dehydration with hyponatremia   Transaminitis  Abdominal aortic aneurysm without rupture Incidental finding with CT abdomen showing aneurysm measuring 5.7 cm in diameter Appreciate vascular surgery input.  They are planning for EVAR surgery for rapidly growing urgent AAA.  On Wednesday 6/15 -pulmonary and cardiac clearance have been obtained and is cleared Patient is medically clear for planned procedure  Ascites Patient has a history of liver cirrhosis and admitted for evaluation of abdominal distention associated with pain.  He denies having any fever/chills.  Fluid studies remain negative.  Antibiotics have been discontinued. IR guided paracentesis on 6/10 with removal of 8 L of peritoneal fluid.  Fluid cultures negative for any bacterial growth Plan: No indication of SBP.  No need for antibiotics at this time.  Patient might require another paracentesis relatively soon.  Deferred for now pending vascular surgery plans for AAA repair  History of liver cirrhosis with portal hypertension and  splenomegaly Maintain low-sodium diet Hold diuretics for now due to significant dehydration and electrolyte abnormalities Continue lactulose Defer to nephrology regarding timing of diuretic restart   Dehydration with hyponatremia AKI Secondary to poor oral intake and concomitant diuretic use.  Also hypotension Hold diuretics for now US renal negative for any obstruction Nephrology following.  Getting albumin for volume expansion Creatinine improved from 1.5->1.05-->0.76   Constipation/gas and bloating symptoms Umbilical hernia with bowel obstruction Surgery was able to reduce at bedside, no plans for hernia repair at this time per surgery Patient having bowel movements   Insomnia/anxiety Continue Klonopin 0.5 mg p.o. 4 times daily and trazodone at night for sleep   COPD with chronic respiratory failure Not acutely exacerbated Worsening shortness of breath appears to be secondary to massive ascites compressing on the diaphragm. Continue as needed bronchodilator therapy Continue home oxygen at 2 L to maintain pulse oximetry greater than 92%   Morbid obesity (BMI 45.19) Complicates overall prognosis. Lifestyle modification and exercise has been discussed with patient   Transaminitis Likely due to worsening liver cirrhosis Monitor liver enzymes   Weakness PT, OT recommends SNF.  Will consult TOC for placement   DVT prophylaxis: SCD Code Status: Full Family Communication: Spouse at bedside Disposition Plan: Status is: Inpatient  Remains inpatient appropriate because:Inpatient level of care appropriate due to severity of illness  Dispo: The patient is from: Home              Anticipated d/c is to: SNF              Patient currently is not medically stable to d/c.   Difficult to place patient No  Plan for AAA surgical repair  Level of care: Med-Surg  Consultants:  Pulmonary Vascular General surgery Nephrology  Procedures:  Paracentesis  Antimicrobials:   None   Subjective: Seen and examined.  Resting in bed.  No visible distress.  Objective: Vitals:   07/20/20 1604 07/20/20 2030 07/21/20 0408 07/21/20 1123  BP: 120/79 109/76 122/84 124/81  Pulse: 89 92 91 98  Resp:  20 20   Temp: 98.1 F (36.7 C) (!) 97.4 F (36.3 C) (!) 97.5 F (36.4 C) 97.6 F (36.4 C)  TempSrc:  Oral Oral   SpO2: 91% 95% 96% 98%  Weight:      Height:        Intake/Output Summary (Last 24 hours) at 07/21/2020 1609 Last data filed at 07/21/2020 1505 Gross per 24 hour  Intake 379.81 ml  Output 200 ml  Net 179.81 ml   Filed Weights   07/17/20 0953  Weight: 127 kg    Examination:  General exam: No acute distress.  Appears chronically ill Respiratory system: Bibasilar crackles.  Normal work of breathing.  2 L Cardiovascular system: S1-S2, regular rate and rhythm, no murmurs, trace pitting edema bilaterally Gastrointestinal system: Obese, none tender, distention, positive bowel sounds Central nervous system: Alert and oriented. No focal neurological deficits. Extremities: Symmetric 5 x 5 power. Skin: No rashes, lesions or ulcers Psychiatry: Judgement and insight appear normal. Mood & affect appropriate.     Data Reviewed: I have personally reviewed following labs and imaging studies  CBC: Recent Labs  Lab 07/17/20 1004 07/18/20 0455 07/19/20 0511 07/20/20 0441 07/21/20 0539  WBC 15.0* 16.2* 13.3* 11.3* 11.6*  NEUTROABS 13.2*  --   --   --   --   HGB 15.0 16.1 14.7 14.4 13.8  HCT 44.0 47.7 43.5 43.4 42.2  MCV 83.8 84.9 86.5 86.5 86.8  PLT 454* 431* 355 325 319   Basic Metabolic Panel: Recent Labs  Lab 07/17/20 1004 07/18/20 0455 07/19/20 0511 07/20/20 0441 07/21/20 0539  NA 127* 129* 129* 129* 129*  K 3.5 3.9 4.2 3.7 3.1*  CL 77* 77* 77* 80* 82*  CO2 32 37* 39* 38* 39*  GLUCOSE 143* 140* 135* 111* 117*  BUN 47* 47* 45* 35* 28*  CREATININE 1.32* 1.40* 1.52* 1.02 0.76  CALCIUM 9.2 9.1 8.8* 8.6* 8.8*  MG 2.4  --   --   --   --     GFR: Estimated Creatinine Clearance: 116 mL/min (by C-G formula based on SCr of 0.76 mg/dL). Liver Function Tests: Recent Labs  Lab 07/17/20 1004 07/20/20 0441 07/21/20 0539  AST 111* 58* 56*  ALT 259* 139* 115*  ALKPHOS 111 104 102  BILITOT 2.6* 1.6* 1.5*  PROT 7.3 6.7 6.6  ALBUMIN 3.2* 3.6 3.4*   Recent Labs  Lab 07/17/20 1004  LIPASE 24   Recent Labs  Lab 07/17/20 1142  AMMONIA 11   Coagulation Profile: Recent Labs  Lab 07/17/20 1004  INR 1.1   Cardiac Enzymes: No results for input(s): CKTOTAL, CKMB, CKMBINDEX, TROPONINI in the last 168 hours. BNP (last 3 results) No results for input(s): PROBNP in the last 8760 hours. HbA1C: No results for input(s): HGBA1C in the last 72 hours. CBG: No results for input(s): GLUCAP in the last 168 hours. Lipid Profile: Recent Labs    07/19/20 0950  CHOL 171   Thyroid Function Tests: Recent Labs    07/19/20 0950  TSH 2.438   Anemia Panel: No results for input(s): VITAMINB12, FOLATE, FERRITIN, TIBC, IRON, RETICCTPCT in the last 72  hours. Sepsis Labs: Recent Labs  Lab 07/17/20 1353  LATICACIDVEN 1.4    Recent Results (from the past 240 hour(s))  Resp Panel by RT-PCR (Flu A&B, Covid) Nasopharyngeal Swab     Status: None   Collection Time: 07/17/20 10:04 AM   Specimen: Nasopharyngeal Swab; Nasopharyngeal(NP) swabs in vial transport medium  Result Value Ref Range Status   SARS Coronavirus 2 by RT PCR NEGATIVE NEGATIVE Final    Comment: (NOTE) SARS-CoV-2 target nucleic acids are NOT DETECTED.  The SARS-CoV-2 RNA is generally detectable in upper respiratory specimens during the acute phase of infection. The lowest concentration of SARS-CoV-2 viral copies this assay can detect is 138 copies/mL. A negative result does not preclude SARS-Cov-2 infection and should not be used as the sole basis for treatment or other patient management decisions. A negative result may occur with  improper specimen  collection/handling, submission of specimen other than nasopharyngeal swab, presence of viral mutation(s) within the areas targeted by this assay, and inadequate number of viral copies(<138 copies/mL). A negative result must be combined with clinical observations, patient history, and epidemiological information. The expected result is Negative.  Fact Sheet for Patients:  BloggerCourse.comhttps://www.fda.gov/media/152166/download  Fact Sheet for Healthcare Providers:  SeriousBroker.ithttps://www.fda.gov/media/152162/download  This test is no t yet approved or cleared by the [place] FDA and  has been authorized for detection and/or diagnosis of SARS-CoV-2 by FDA under an Emergency Use Authorization (EUA). This EUA will remain  in effect (meaning this test can be used) for the duration of the COVID-19 declaration under Section 564(b)(1) of the Act, 21 U.S.C.section 360bbb-3(b)(1), unless the authorization is terminated  or revoked sooner.       Influenza A by PCR NEGATIVE NEGATIVE Final   Influenza B by PCR NEGATIVE NEGATIVE Final    Comment: (NOTE) The Xpert Xpress SARS-CoV-2/FLU/RSV plus assay is intended as an aid in the diagnosis of influenza from Nasopharyngeal swab specimens and should not be used as a sole basis for treatment. Nasal washings and aspirates are unacceptable for Xpert Xpress SARS-CoV-2/FLU/RSV testing.  Fact Sheet for Patients: BloggerCourse.comhttps://www.fda.gov/media/152166/download  Fact Sheet for Healthcare Providers: SeriousBroker.ithttps://www.fda.gov/media/152162/download  This test is not yet approved or cleared by the [place] FDA and has been authorized for detection and/or diagnosis of SARS-CoV-2 by FDA under an Emergency Use Authorization (EUA). This EUA will remain in effect (meaning this test can be used) for the duration of the COVID-19 declaration under Section 564(b)(1) of the Act, 21 U.S.C. section 360bbb-3(b)(1), unless the authorization is terminated or revoked.  Performed at [facility], [address] [place], [place] [address]   Blood culture (routine x 2)     Status: None (Preliminary result)   Collection Time: 07/17/20  1:53 PM   Specimen: BLOOD  Result Value Ref Range Status   Specimen Description BLOOD RIGHT ANTECUBITAL  Final   Special Requests   Final    BOTTLES DRAWN AEROBIC AND ANAEROBIC Blood Culture adequate volume   Culture   Final    NO GROWTH 4 DAYS Performed at [facility], [address] [place], [place] [address]    Report Status PENDING  Incomplete  Blood culture (routine x 2)     Status: None (Preliminary result)   Collection Time: 07/17/20  1:53 PM   Specimen: BLOOD  Result Value Ref Range Status   Specimen Description BLOOD BLOOD RIGHT FOREARM  Final   Special Requests   Final    BOTTLES DRAWN AEROBIC AND ANAEROBIC Blood Culture adequate  volume   Culture   Final    NO GROWTH 4 DAYS Performed at [facility], [address] [place], [place] [address]    Report Status PENDING  Incomplete  Body fluid culture w Gram Stain     Status: None   Collection Time: 07/17/20  4:30 PM   Specimen: PATH Cytology Peritoneal fluid  Result Value Ref Range Status   Specimen Description   Final    PERITONEAL Performed at [facility], [address], [place], [place] [address]    Special Requests   Final    NONE Performed at [facility], [address] [place], [place] [address]    Gram Stain NO WBC SEEN NO ORGANISMS SEEN   Final   Culture   Final    NO GROWTH 3 DAYS Performed at [facility], [address], [place], [place] [address]    Report Status 07/21/2020 FINAL  Final         Radiology Studies: CT ABDOMEN PELVIS W CONTRAST  Result Date: 07/19/2020 CLINICAL DATA:  Acute abdominal pain.  Hypotension. EXAM: CT ABDOMEN AND PELVIS WITH CONTRAST TECHNIQUE: Multidetector CT imaging of the abdomen and pelvis was performed using the standard protocol following bolus  administration of intravenous contrast. CONTRAST:  OMNIPAQUE IOHEXOL 300 MG/ML  SOLN COMPARISON:  05/31/2020 and 07/17/2020 FINDINGS: Lower chest: Small right pleural effusion unchanged to slightly worse. Left lung is clear. Hepatobiliary: Somewhat small nodular liver likely due to cirrhosis. No focal liver mass. Evidence of cholelithiasis with cholesterol stone noted and unchanged. Biliary tree is unremarkable. Pancreas: Normal. Spleen: Normal. Adrenals/Urinary Tract: Adrenal glands are normal. Kidneys are normal in size without hydronephrosis. Right renal cyst unchanged. Ureters and bladder are normal. Stomach/Bowel: Stomach is unremarkable. There is an umbilical hernia containing a short segment of small bowel which is now incarcerated as there is interval development of dilatation of the small bowel loops immediately proximal to entering the hernia sac measuring 5 cm in diameter. Small bowel distal to the hernia sac is normal. Air is present throughout the colon. There is diverticulosis of the sigmoid colon. Vascular/Lymphatic: Calcified plaque over the abdominal aorta with moderate mural thrombus over the distal abdominal aorta. Again noted is patient's distal abdominal aortic aneurysm measuring 4.8 x 5.7 cm in AP and transverse dimension at the same level as measured previously (previously 4.3 x 5.7 cm). Greatest AP dimension is 5.1 cm which is unchanged. No adenopathy. Reproductive: Normal. Other: Moderate ascites without significant change. There are a few areas of soft tissue nodularity associated with ascites concerning for possible peritoneal spread of neoplastic disease/malignant ascites. Musculoskeletal: Degenerative change of the spine and hips. IMPRESSION: 1. Umbilical hernia containing a short segment of small bowel which is now incarcerated as there is interval development of small bowel obstruction immediately proximal to the hernia sac measuring 5 cm in diameter. 2. Moderate ascites without  significant change. There are a few areas of soft tissue nodularity associated with ascites concerning for possible peritoneal spread of neoplastic disease/malignant ascites. Recommend correlation with known underlying neoplasm. 3. Stable distal abdominal aortic aneurysm measuring 4.8 x 5.7 cm in AP and transverse dimension as measured previously (previously 4.3 x 5.7 cm). Recommend referral to a vascular specialist if not already done. This recommendation follows ACR consensus guidelines: White Paper of the ACR Incidental Findings Committee II on Vascular Findings. J Am Coll Radiol 2013; 10:789-794. 4. Cholelithiasis. 5. Right renal cyst unchanged. 6. Colonic diverticulosis. 7. Aortic  atherosclerosis. 8. Somewhat small nodular liver possibly due to cirrhosis. Aortic Atherosclerosis (ICD10-I70.0). These results were called by telephone at the time of interpretation on 07/19/2020 at 5:05 pm to provider Louisa Second , who verbally acknowledged these results. Electronically Signed   By: Elberta Fortis M.D.   On: 07/19/2020 17:03        Scheduled Meds:  bisacodyl  10 mg Oral Daily   clonazepam  0.5 mg Oral QID   COVID-19 mRNA Vac-TriS ([employer])  0.3 mL Intramuscular ONCE-1600   feeding supplement  237 mL Oral TID BM   lactulose  10 g Oral BID   polyethylene glycol  17 g Oral Daily   Ensure Max Protein  11 oz Oral BID   senna-docusate  2 tablet Oral BID   sodium chloride flush  3 mL Intravenous Q12H   tiotropium  1 capsule Inhalation Daily   traZODone  50 mg Oral QHS   Continuous Infusions:  sodium chloride Stopped (07/20/20 2323)   albumin human Stopped (07/21/20 1229)     LOS: 4 days    Time spent: 25 minutes    Tresa Moore, MD [facility] Pager [phone]  If 7PM-7AM, please contact night-coverage 07/21/2020, 4:09 PM

## 2020-07-21 NOTE — Progress Notes (Signed)
[facility]  ROUNDING NOTE   Subjective:   Seth Haley is a 65 y.o. male with a medical history of  hypertension, COPD with chronic respiratory failure on 2 L of oxygen continuous, history of liver cirrhosis with portal hypertension and splenomegaly, who was [facility] on 07/17/2020 for evaluation of SOB, increased abdominal distension and lower abdominal pain.  Patient seen resting in bed Continues to have poor appetite Denies nausea Denies shortness of breath   Objective:  Vital signs in last 24 hours:  Temp:  [97.4 F (36.3 C)-98.1 F (36.7 C)] 97.6 F (36.4 C) (06/14 1123) Pulse Rate:  [89-98] 98 (06/14 1123) Resp:  [20] 20 (06/14 0408) BP: (109-124)/(76-84) 124/81 (06/14 1123) SpO2:  [91 %-98 %] 98 % (06/14 1123)  Weight change:  Filed Weights   07/17/20 0953  Weight: 127 kg    Intake/Output: I/O last 3 completed shifts: In: 915.2 [P.O.:240; I.V.:230.9; IV Piggyback:444.3] Out: 800 [Urine:800]   Intake/Output this shift:  No intake/output data recorded.  Physical Exam: General: NAD, resting in bed  Head: Normocephalic, atraumatic. Moist oral mucosal membranes  Eyes: Anicteric  Lungs:  Clear to auscultation, normal breathing effort  Heart: Regular rate and rhythm  Abdomen:  Soft, nontender  Extremities:  trace peripheral edema  Neurologic: Nonfocal, moving all four extremities  Skin: No lesions, dry, flaky skin BLE       Basic Metabolic Panel: Recent Labs  Lab 07/17/20 1004 07/18/20 0455 07/19/20 0511 07/20/20 0441 07/21/20 0539  NA 127* 129* 129* 129* 129*  K 3.5 3.9 4.2 3.7 3.1*  CL 77* 77* 77* 80* 82*  CO2 32 37* 39* 38* 39*  GLUCOSE 143* 140* 135* 111* 117*  BUN 47* 47* 45* 35* 28*  CREATININE 1.32* 1.40* 1.52* 1.02 0.76  CALCIUM 9.2 9.1 8.8* 8.6* 8.8*  MG 2.4  --   --   --   --      Liver Function Tests: Recent Labs  Lab 07/17/20 1004 07/20/20 0441 07/21/20 0539  AST 111* 58* 56*  ALT 259* 139* 115*   ALKPHOS 111 104 102  BILITOT 2.6* 1.6* 1.5*  PROT 7.3 6.7 6.6  ALBUMIN 3.2* 3.6 3.4*    Recent Labs  Lab 07/17/20 1004  LIPASE 24    Recent Labs  Lab 07/17/20 1142  AMMONIA 11     CBC: Recent Labs  Lab 07/17/20 1004 07/18/20 0455 07/19/20 0511 07/20/20 0441 07/21/20 0539  WBC 15.0* 16.2* 13.3* 11.3* 11.6*  NEUTROABS 13.2*  --   --   --   --   HGB 15.0 16.1 14.7 14.4 13.8  HCT 44.0 47.7 43.5 43.4 42.2  MCV 83.8 84.9 86.5 86.5 86.8  PLT 454* 431* 355 325 319     Cardiac Enzymes: No results for input(s): CKTOTAL, CKMB, CKMBINDEX, TROPONINI in the last 168 hours.  BNP: Invalid input(s): POCBNP  CBG: No results for input(s): GLUCAP in the last 168 hours.  Microbiology: Results for orders placed or performed during the hospital encounter of 07/17/20  Resp Panel by RT-PCR (Flu A&B, Covid) Nasopharyngeal Swab     Status: None   Collection Time: 07/17/20 10:04 AM   Specimen: Nasopharyngeal Swab; Nasopharyngeal(NP) swabs in vial transport medium  Result Value Ref Range Status   SARS Coronavirus 2 by RT PCR NEGATIVE NEGATIVE Final    Comment: (NOTE) SARS-CoV-2 target nucleic acids are NOT DETECTED.  The SARS-CoV-2 RNA is generally detectable in upper respiratory specimens during the acute phase of  infection. The lowest concentration of SARS-CoV-2 viral copies this assay can detect is 138 copies/mL. A negative result does not preclude SARS-Cov-2 infection and should not be used as the sole basis for treatment or other patient management decisions. A negative result may occur with  improper specimen collection/handling, submission of specimen other than nasopharyngeal swab, presence of viral mutation(s) within the areas targeted by this assay, and inadequate number of viral copies(<138 copies/mL). A negative result must be combined with clinical observations, patient history, and epidemiological information. The expected result is Negative.  Fact Sheet for  Patients:  BloggerCourse.com  Fact Sheet for Healthcare Providers:  SeriousBroker.it  This test is no t yet approved or cleared by the [place] FDA and  has been authorized for detection and/or diagnosis of SARS-CoV-2 by FDA under an Emergency Use Authorization (EUA). This EUA will remain  in effect (meaning this test can be used) for the duration of the COVID-19 declaration under Section 564(b)(1) of the Act, 21 U.S.C.section 360bbb-3(b)(1), unless the authorization is terminated  or revoked sooner.       Influenza A by PCR NEGATIVE NEGATIVE Final   Influenza B by PCR NEGATIVE NEGATIVE Final    Comment: (NOTE) The Xpert Xpress SARS-CoV-2/FLU/RSV plus assay is intended as an aid in the diagnosis of influenza from Nasopharyngeal swab specimens and should not be used as a sole basis for treatment. Nasal washings and aspirates are unacceptable for Xpert Xpress SARS-CoV-2/FLU/RSV testing.  Fact Sheet for Patients: BloggerCourse.com  Fact Sheet for Healthcare Providers: SeriousBroker.it  This test is not yet approved or cleared by the [place] FDA and has been authorized for detection and/or diagnosis of SARS-CoV-2 by FDA under an Emergency Use Authorization (EUA). This EUA will remain in effect (meaning this test can be used) for the duration of the COVID-19 declaration under Section 564(b)(1) of the Act, 21 U.S.C. section 360bbb-3(b)(1), unless the authorization is terminated or revoked.  Performed at [facility], [address] [place], [place] [address]   Blood culture (routine x 2)     Status: None (Preliminary result)   Collection Time: 07/17/20  1:53 PM   Specimen: BLOOD  Result Value Ref Range Status   Specimen Description BLOOD RIGHT ANTECUBITAL  Final   Special Requests   Final    BOTTLES DRAWN AEROBIC AND ANAEROBIC Blood Culture adequate volume    Culture   Final    NO GROWTH 4 DAYS Performed at [facility], [address] [place], [place] [address]    Report Status PENDING  Incomplete  Blood culture (routine x 2)     Status: None (Preliminary result)   Collection Time: 07/17/20  1:53 PM   Specimen: BLOOD  Result Value Ref Range Status   Specimen Description BLOOD BLOOD RIGHT FOREARM  Final   Special Requests   Final    BOTTLES DRAWN AEROBIC AND ANAEROBIC Blood Culture adequate volume   Culture   Final    NO GROWTH 4 DAYS Performed at [facility], [address] [place], [place] [address]    Report Status PENDING  Incomplete  Body fluid culture w Gram Stain     Status: None   Collection Time: 07/17/20  4:30 PM   Specimen: PATH Cytology Peritoneal fluid  Result Value Ref Range Status   Specimen Description   Final    PERITONEAL Performed at [facility], [address], [place], [place] [address]    Special Requests   Final  NONE Performed at [facility], [address] [place], [place] [address]    Gram Stain NO WBC SEEN NO ORGANISMS SEEN   Final   Culture   Final    NO GROWTH 3 DAYS Performed at [facility], [address] [place], [place] [address]    Report Status 07/21/2020 FINAL  Final    Coagulation Studies: No results for input(s): LABPROT, INR in the last 72 hours.  Urinalysis: Recent Labs    07/18/20 1715  COLORURINE AMBER*  LABSPEC 1.018  PHURINE 5.0  GLUCOSEU NEGATIVE  HGBUR NEGATIVE  BILIRUBINUR NEGATIVE  KETONESUR NEGATIVE  PROTEINUR NEGATIVE  NITRITE NEGATIVE  LEUKOCYTESUR NEGATIVE       Imaging: CT ABDOMEN PELVIS W CONTRAST  Result Date: 07/19/2020 CLINICAL DATA:  Acute abdominal pain.  Hypotension. EXAM: CT ABDOMEN AND PELVIS WITH CONTRAST TECHNIQUE: Multidetector CT imaging of the abdomen and pelvis was performed using the standard protocol following bolus administration of intravenous contrast. CONTRAST:  OMNIPAQUE  IOHEXOL 300 MG/ML  SOLN COMPARISON:  05/31/2020 and 07/17/2020 FINDINGS: Lower chest: Small right pleural effusion unchanged to slightly worse. Left lung is clear. Hepatobiliary: Somewhat small nodular liver likely due to cirrhosis. No focal liver mass. Evidence of cholelithiasis with cholesterol stone noted and unchanged. Biliary tree is unremarkable. Pancreas: Normal. Spleen: Normal. Adrenals/Urinary Tract: Adrenal glands are normal. Kidneys are normal in size without hydronephrosis. Right renal cyst unchanged. Ureters and bladder are normal. Stomach/Bowel: Stomach is unremarkable. There is an umbilical hernia containing a short segment of small bowel which is now incarcerated as there is interval development of dilatation of the small bowel loops immediately proximal to entering the hernia sac measuring 5 cm in diameter. Small bowel distal to the hernia sac is normal. Air is present throughout the colon. There is diverticulosis of the sigmoid colon. Vascular/Lymphatic: Calcified plaque over the abdominal aorta with moderate mural thrombus over the distal abdominal aorta. Again noted is patient's distal abdominal aortic aneurysm measuring 4.8 x 5.7 cm in AP and transverse dimension at the same level as measured previously (previously 4.3 x 5.7 cm). Greatest AP dimension is 5.1 cm which is unchanged. No adenopathy. Reproductive: Normal. Other: Moderate ascites without significant change. There are a few areas of soft tissue nodularity associated with ascites concerning for possible peritoneal spread of neoplastic disease/malignant ascites. Musculoskeletal: Degenerative change of the spine and hips. IMPRESSION: 1. Umbilical hernia containing a short segment of small bowel which is now incarcerated as there is interval development of small bowel obstruction immediately proximal to the hernia sac measuring 5 cm in diameter. 2. Moderate ascites without significant change. There are a few areas of soft tissue nodularity  associated with ascites concerning for possible peritoneal spread of neoplastic disease/malignant ascites. Recommend correlation with known underlying neoplasm. 3. Stable distal abdominal aortic aneurysm measuring 4.8 x 5.7 cm in AP and transverse dimension as measured previously (previously 4.3 x 5.7 cm). Recommend referral to a vascular specialist if not already done. This recommendation follows ACR consensus guidelines: White Paper of the ACR Incidental Findings Committee II on Vascular Findings. J Am Coll Radiol 2013; 10:789-794. 4. Cholelithiasis. 5. Right renal cyst unchanged. 6. Colonic diverticulosis. 7. Aortic atherosclerosis. 8. Somewhat small nodular liver possibly due to cirrhosis. Aortic Atherosclerosis (ICD10-I70.0). These results were called by telephone at the time of interpretation on 07/19/2020 at 5:05 pm to provider Louisa Second , who verbally acknowledged these results. Electronically Signed   By: Elberta Fortis M.D.  On: 07/19/2020 17:03   US RENAL  Result Date: 07/19/2020 CLINICAL DATA:  AK I EXAM: RENAL / URINARY TRACT ULTRASOUND COMPLETE COMPARISON:  July 19, 2020 FINDINGS: Right Kidney: Renal measurements: 12.3 x 5.9 x 5.7 cm = volume: 215 mL. Echogenicity is within normal limits. There is a anechoic mass with posterior acoustic enhancement consistent with a benign cyst which measures 2.9 x 3.3 x 3.0 cm. No hydronephrosis. Left Kidney: Renal measurements: 12.0 x 4.4 x 5.4 cm = volume: 150 mL. Echogenicity is within normal limits. There is an echogenic focus consistent with a nonobstructive nephrolithiasis vs calcification as seen on prior CT. No hydronephrosis. Bladder: Appears normal for degree of bladder distention. Other: Incidental note of small to moderate volume ascites IMPRESSION: 1. No hydronephrosis. 2. Small to moderate volume ascites. Electronically Signed   By: Meda Klinefelter MD   On: 07/19/2020 15:52     Medications:    sodium chloride Stopped (07/20/20 2323)    albumin human 25 g (07/21/20 1106)    bisacodyl  10 mg Oral Daily   clonazepam  0.5 mg Oral QID   COVID-19 mRNA Vac-TriS (Pfizer)  0.3 mL Intramuscular ONCE-1600   feeding supplement  237 mL Oral TID BM   lactulose  10 g Oral BID   polyethylene glycol  17 g Oral Daily   Ensure Max Protein  11 oz Oral BID   senna-docusate  2 tablet Oral BID   sodium chloride flush  3 mL Intravenous Q12H   tiotropium  1 capsule Inhalation Daily   traZODone  50 mg Oral QHS   sodium chloride, albuterol, alum & mag hydroxide-simeth, ondansetron **OR** ondansetron (ZOFRAN) IV, sodium chloride flush  Assessment/ Plan:  Mr. Wilho Sharpley is a 65 y.o.  male with a medical history of  hypertension, COPD with chronic respiratory failure on 2 L of oxygen continuous, history of liver cirrhosis with portal hypertension and splenomegaly, who was admitted to [facility] on 07/17/2020 for evaluation of SOB, increased abdominal distension and lower abdominal pain.  Acute Kidney Injury with baseline creatinine 0.9 and GFR of >60 on 06/05/20.  Acute kidney injury secondary to dehydration and diuretic use Renal ultrasound negative for obstruction No IV contrast exposure No indication for dialysis at this time Creatinine at baseline Improved with IVF Albumin TID   Lab Results  Component Value Date   CREATININE 0.76 07/21/2020   CREATININE 1.02 07/20/2020   CREATININE 1.52 (H) 07/19/2020    Intake/Output Summary (Last 24 hours) at 07/21/2020 1146 Last data filed at 07/21/2020 4034 Gross per 24 hour  Intake 735.11 ml  Output --  Net 735.11 ml     2. Hyponatremia Remains 129 Stable over past 3 days Due to liver cirrhosis, will monitor and treat if needed     LOS: 4   6/14/202211:46 AM

## 2020-07-21 NOTE — Progress Notes (Signed)
I sat with the patient and discussed end-of-life.  He seems quite comfortable and rational with his decision to move forward with comfort care and hospice.  Given this fact we will cancel his planned aneurysm repair for tomorrow.  Given his plan there is no need for any further follow-up.

## 2020-07-22 DIAGNOSIS — Z515 Encounter for palliative care: Secondary | ICD-10-CM

## 2020-07-22 DIAGNOSIS — I7409 Other arterial embolism and thrombosis of abdominal aorta: Secondary | ICD-10-CM

## 2020-07-22 LAB — CULTURE, BLOOD (ROUTINE X 2)
Culture: NO GROWTH
Culture: NO GROWTH
Special Requests: ADEQUATE
Special Requests: ADEQUATE

## 2020-07-22 SURGERY — ENDOVASCULAR REPAIR/STENT GRAFT
Anesthesia: General

## 2020-07-22 SURGERY — ANEURYSM ABDOMINAL AORTIC REPAIR
Anesthesia: Choice

## 2020-07-22 MED ORDER — ONDANSETRON 4 MG PO TBDP: 4.0000 mg | ORAL_TABLET | Freq: Four times a day (QID) | ORAL | Status: DC | PRN

## 2020-07-22 MED ORDER — CLONAZEPAM 0.25 MG PO TBDP: 1.0000 mg | ORAL_TABLET | Freq: Four times a day (QID) | ORAL | Status: DC

## 2020-07-22 MED ORDER — METOPROLOL TARTRATE 5 MG/5ML IV SOLN: 5.0000 mg | INTRAVENOUS | Status: AC | PRN

## 2020-07-22 MED ORDER — SODIUM CHLORIDE 0.9 % IV SOLN
2.0000 mg/h | INTRAVENOUS | Status: DC
  Administered 2020-07-22: 2 mg/h via INTRAVENOUS
  Filled 2020-07-22: qty 5

## 2020-07-22 MED ORDER — LORAZEPAM 1 MG PO TABS: 1.0000 mg | ORAL_TABLET | ORAL | Status: DC | PRN

## 2020-07-22 MED ORDER — LORAZEPAM 2 MG/ML PO CONC: 1.0000 mg | ORAL | Status: DC | PRN

## 2020-07-22 MED ORDER — ONDANSETRON HCL 4 MG/2ML IJ SOLN: 4.0000 mg | Freq: Four times a day (QID) | INTRAMUSCULAR | Status: DC | PRN

## 2020-07-22 MED ORDER — METOPROLOL TARTRATE 5 MG/5ML IV SOLN: 5.0000 mg | INTRAVENOUS | Status: DC | PRN

## 2020-07-22 MED ORDER — HYDROMORPHONE BOLUS VIA INFUSION
2.0000 mg | INTRAVENOUS | Status: DC | PRN
Start: 2020-07-22 — End: 2020-07-23
  Filled 2020-07-22: qty 3

## 2020-07-22 MED ORDER — HALOPERIDOL LACTATE 5 MG/ML IJ SOLN: 0.5000 mg | INTRAMUSCULAR | Status: DC | PRN

## 2020-07-22 MED ORDER — GLYCOPYRROLATE 1 MG PO TABS
1.0000 mg | ORAL_TABLET | ORAL | Status: DC | PRN
  Filled 2020-07-22: qty 1

## 2020-07-22 MED ORDER — LORAZEPAM 2 MG/ML IJ SOLN: 1.0000 mg | INTRAMUSCULAR | 0 refills | Status: AC | PRN

## 2020-07-22 MED ORDER — GLYCOPYRROLATE 0.2 MG/ML IJ SOLN
0.2000 mg | INTRAMUSCULAR | Status: DC | PRN
  Filled 2020-07-22: qty 1

## 2020-07-22 MED ORDER — LORAZEPAM 1 MG PO TABS: 1.0000 mg | ORAL_TABLET | ORAL | 0 refills | Status: AC | PRN

## 2020-07-22 MED ORDER — HALOPERIDOL 0.5 MG PO TABS
0.5000 mg | ORAL_TABLET | ORAL | Status: DC | PRN
  Filled 2020-07-22: qty 1

## 2020-07-22 MED ORDER — ACETAMINOPHEN 325 MG PO TABS
650.0000 mg | ORAL_TABLET | Freq: Four times a day (QID) | ORAL | Status: DC | PRN
Start: 2020-07-22 — End: 2020-07-23

## 2020-07-22 MED ORDER — ACETAMINOPHEN 650 MG RE SUPP: 650.0000 mg | Freq: Four times a day (QID) | RECTAL | Status: DC | PRN

## 2020-07-22 MED ORDER — ONDANSETRON 4 MG PO TBDP: 4.0000 mg | ORAL_TABLET | Freq: Four times a day (QID) | ORAL | 0 refills | Status: AC | PRN

## 2020-07-22 MED ORDER — GLYCOPYRROLATE 0.2 MG/ML IJ SOLN
0.2000 mg | INTRAMUSCULAR | Status: DC | PRN
  Administered 2020-07-22: 0.2 mg via SUBCUTANEOUS
  Filled 2020-07-22 (×3): qty 1

## 2020-07-22 MED ORDER — LORAZEPAM 2 MG/ML IJ SOLN: 1.0000 mg | INTRAMUSCULAR | Status: DC | PRN

## 2020-07-22 MED ORDER — POLYVINYL ALCOHOL 1.4 % OP SOLN
1.0000 [drp] | Freq: Four times a day (QID) | OPHTHALMIC | Status: DC | PRN
  Filled 2020-07-22: qty 15

## 2020-07-22 MED ORDER — PROMETHAZINE HCL 25 MG/ML IJ SOLN
12.5000 mg | Freq: Four times a day (QID) | INTRAMUSCULAR | Status: DC | PRN
Start: 2020-07-22 — End: 2020-07-23
  Administered 2020-07-22: 25 mg via INTRAVENOUS
  Filled 2020-07-22: qty 1

## 2020-07-22 MED ORDER — LORAZEPAM 2 MG/ML PO CONC
1.0000 mg | ORAL | 0 refills | Status: AC | PRN
Start: 2020-07-22 — End: ?

## 2020-07-22 MED ORDER — BIOTENE DRY MOUTH MT LIQD: 15.0000 mL | OROMUCOSAL | Status: DC | PRN

## 2020-07-22 MED ORDER — HALOPERIDOL LACTATE 2 MG/ML PO CONC
0.5000 mg | ORAL | Status: DC | PRN
  Filled 2020-07-22: qty 0.3

## 2020-07-22 MED ORDER — SODIUM CHLORIDE 0.9 % IV SOLN: 2.0000 mg/h | INTRAVENOUS | 0 refills | Status: AC

## 2020-07-22 NOTE — Progress Notes (Addendum)
Daily Progress Note   Patient Name: Seth Haley       Date: 07/22/2020 DOB: 01-Aug-1955  Age: 65 y.o. MRN#: [id number] Attending Physician: Sidney Ace, MD Primary Care Physician: Erick Colace, NP Admit Date: 07/17/2020  Reason for Consultation/Follow-up: Establishing goals of care and Terminal Care  Subjective: Patient is resting in bed. He winces in pain intermittently. His sister is at bedside. They discuss that they have already established a plan for hospice and comfort until death. Discussed his symptom management needs as he has pain and is SOB. He states he wants to be at peace and be comfortable until death. He does not want any further life prolonging measures. He and his sister fully understand his AAA and has declined surgery, SOB from ascites 2/2 cirrhosis. He is not eating or drinking.   I completed a MOST form today and the signed original was placed in the chart. A photocopy was placed in the chart to be scanned into EMR. The patient outlined their wishes for the following treatment decisions:  Cardiopulmonary Resuscitation: Do Not Attempt Resuscitation (DNR/No CPR)  Medical Interventions: Comfort Measures: Keep clean, warm, and dry. Use medication by any route, positioning, wound care, and other measures to relieve pain and suffering. Use oxygen, suction and manual treatment of airway obstruction as needed for comfort. Do not transfer to the hospital unless comfort needs cannot be met in current location.  Antibiotics: No antibiotics (use other measures to relieve symptoms)  IV Fluids: No IV fluids (provide other measures to ensure comfort)  Feeding Tube: No feeding tube    Length of Stay: 5  Current Medications: Scheduled Meds:   bisacodyl  10 mg Oral Daily    COVID-19 mRNA Vac-TriS (Pfizer)  0.3 mL Intramuscular ONCE-1600   tiotropium  1 capsule Inhalation Daily    Continuous Infusions:  HYDROmorphone      PRN Meds: acetaminophen **OR** acetaminophen, albuterol, alum & mag hydroxide-simeth, antiseptic oral rinse, glycopyrrolate **OR** glycopyrrolate **OR** glycopyrrolate, haloperidol **OR** haloperidol **OR** haloperidol lactate, HYDROmorphone, LORazepam **OR** LORazepam **OR** LORazepam, metoprolol tartrate, [DISCONTINUED] ondansetron **OR** ondansetron (ZOFRAN) IV, ondansetron **OR** ondansetron (ZOFRAN) IV, polyvinyl alcohol  Physical Exam Pulmonary:     Comments: A bit dyspnic Neurological:     Mental Status: He is alert.  Vital Signs: BP 116/73 (BP Location: Right Arm)   Pulse 93   Temp 97.9 F (36.6 C) (Oral)   Resp 19   Ht _0  (1.676 m)   Wt 127 kg   SpO2 98%   BMI 45.19 kg/m  SpO2: SpO2: 98 % O2 Device: O2 Device: Nasal Cannula O2 Flow Rate: O2 Flow Rate (L/min): 2 L/min  Intake/output summary:  Intake/Output Summary (Last 24 hours) at 07/22/2020 1025 Last data filed at 07/22/2020 0301 Gross per 24 hour  Intake 130 ml  Output 301 ml  Net -171 ml   LBM: Last BM Date: 07/21/20 Baseline Weight: Weight: 127 kg Most recent weight: Weight: 127 kg        Patient Active Problem List   Diagnosis Date Noted   SBP (spontaneous bacterial peritonitis) ([place]) 07/17/2020   Chronic respiratory failure (HCC)    Hyponatremia    Dehydration with hyponatremia    Transaminitis    Abdominal pain 06/01/2020   Ascites 05/31/2020   Essential hypertension 05/31/2020   COPD (chronic obstructive pulmonary disease) ([place]) 05/31/2020   Obesity, Class III, BMI 40-49.9 (morbid obesity) ([place]) 05/31/2020   At risk for obstructive sleep apnea 05/31/2020   Cirrhosis of liver with ascites ([place]) 05/31/2020    Palliative Care Assessment & Plan    Recommendations/Plan: Hospice facility as soon as possible.  High symptom  management needs for SOB, may need 1:1 nurse if AAA were to begin to tear. I have discussed this with staff including charge RN.   Code Status:    Code Status Orders  (From admission, onward)           Start     Ordered   07/22/20 1003  Do not attempt resuscitation (DNR)  Continuous       Question Answer Comment  In the event of cardiac or respiratory ARREST Do not call a "code blue"   In the event of cardiac or respiratory ARREST Do not perform Intubation, CPR, defibrillation or ACLS   In the event of cardiac or respiratory ARREST Use medication by any route, position, wound care, and other measures to relive pain and suffering. May use oxygen, suction and manual treatment of airway obstruction as needed for comfort.   Comments MOST form on chart      07/22/20 1007           Code Status History     Date Active Date Inactive Code Status Order ID Comments User Context   07/17/2020 1441 07/22/2020 1007 Full Code [id number]  Collier Bullock, MD ED   05/31/2020 1859 06/05/2020 1942 Full Code [id number]  Cox, Briant Cedar, DO ED      Advance Directive Documentation    Flowsheet Row Most Recent Value  Type of Advance Directive Living will  Pre-existing out of facility DNR order (yellow form or pink MOST form) --  "MOST" Form in Place? --       Prognosis:  Hours - Days   Care plan was discussed with MD, RN, charge RN.   Thank you for allowing the Palliative Medicine Team to assist in the care of this patient.   Time In: 9:30 Time Out: 11:00 Total Time 90 min Prolonged Time Billed  yes       Greater than 50%  of this time was spent counseling and coordinating care related to the above assessment and plan.  Asencion Gowda, NP  Please contact Palliative Medicine Team phone at [phone] for questions and concerns.

## 2020-07-22 NOTE — TOC Initial Note (Signed)
Transition of Care [facility]) - Initial/Assessment Note    Patient Details  Name: Seth Haley MRN: [id number] Date of Birth: October 03, 1955  [facility] CM/SW Contact:    [name], RN Phone Number: 07/22/2020, 9:25 AM  Clinical Narrative:                 Notified by MD that patient and his sister would like to pursue inpatient hospice.  I spoke with sister of [name].  She confirmed the would like [facility] home [facility] [place].   Referral made to [facility] with Civil engineer, contracting          Patient Goals and CMS Choice        Expected Discharge Plan and Services                                                Prior Living Arrangements/Services                       Activities of Daily Living Home Assistive Devices/Equipment: Blood pressure cuff, Shower chair without back, Oxygen, Grab bars in shower, Eyeglasses, Other (Comment) (oximeter) ADL Screening (condition at time of admission) Patient's cognitive ability adequate to safely complete daily activities?: Yes Is the patient deaf or have difficulty hearing?: Yes Does the patient have difficulty seeing, even when wearing glasses/contacts?: Yes Does the patient have difficulty concentrating, remembering, or making decisions?: Yes Patient able to express need for assistance with ADLs?: Yes Does the patient have difficulty dressing or bathing?: Yes Independently performs ADLs?: Yes (appropriate for developmental age) Does the patient have difficulty walking or climbing stairs?: Yes Weakness of Legs: Both Weakness of Arms/Hands: Both  Permission Sought/Granted                  Emotional Assessment              Admission diagnosis:  Pleural effusion [J90] Generalized abdominal pain [R10.84] Transaminitis [R74.01] SBP (spontaneous bacterial peritonitis) (HCC) [K65.2] Other ascites [R18.8] Troponin I above reference range [R77.8] AKI (acute kidney injury) (HCC)  [N17.9] Abdominal pain [R10.9] Cirrhosis of liver without ascites, unspecified hepatic cirrhosis type (HCC) [K74.60] Patient Active Problem List   Diagnosis Date Noted   SBP (spontaneous bacterial peritonitis) (HCC) 07/17/2020   Chronic respiratory failure (HCC)    Hyponatremia    Dehydration with hyponatremia    Transaminitis    Abdominal pain 06/01/2020   Ascites 05/31/2020   Essential hypertension 05/31/2020   COPD (chronic obstructive pulmonary disease) (HCC) 05/31/2020   Obesity, Class III, BMI 40-49.9 (morbid obesity) (HCC) 05/31/2020   At risk for obstructive sleep apnea 05/31/2020   Cirrhosis of liver with ascites (HCC) 05/31/2020   PCP:  Simonne Martinet, NP Pharmacy:   [facility] [phone] [name], [place] - [address] AT [facility] [address] [place] [address] [place] [address] Phone: [phone] Fax: [phone]     Social Determinants of Health (SDOH) Interventions    Readmission Risk Interventions No flowsheet data found.

## 2020-07-22 NOTE — Discharge Summary (Signed)
Physician Discharge Summary  Arty Baumgartnerorman Bruce Klepacki XBM:[id number] DOB: 1955-09-07 DOA: 07/17/2020  PCP: Simonne MartinetBabcock, Peter E, NP  Admit date: 07/17/2020 Discharge date: 07/22/2020  Admitted From: Home Disposition: Residential hospice  Recommendations for Outpatient Follow-up:  Per hospice providers  Home Health: No Equipment/Devices: None  Discharge Condition: Hospice CODE STATUS: DNR/comfort care Diet recommendation: Regular as tolerated  Brief/Interim Summary: 65 y.o. 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  Patient was recently hospitalized about a month ago and had paracentesis done at the time.  Patient with advanced medical comorbidities.  Advanced cirrhosis portends a poor prognosis.  After repeated conversations with myself, pulmonary, palliative care, vascular surgery the decision was made to forego aneurysmal repair and proceed with hospice referral.  Patient is content in his decision.  I had a lengthy discussion with the patient and his sister at bedside on day of discharge and confirmed this is within their wishes and goals of care.  They affirmed that it is so.  Patient does meet criteria for inpatient hospice facility and will be discharged there via transport.  MOST form and DNR form completed by palliative care consult.  Discharge Diagnoses:  Principal Problem:   SBP (spontaneous bacterial peritonitis) (HCC) Active Problems:   Essential hypertension   COPD (chronic obstructive pulmonary disease) (HCC)   Obesity, Class III, BMI 40-49.9 (morbid obesity) (HCC)   Cirrhosis of liver with ascites (HCC)   Chronic respiratory failure (HCC)   Dehydration with hyponatremia    Transaminitis   Hospice care patient   Palliative care patient  Patient has several advanced medical comorbidities.  More specifically of cirrhosis portends a very poor prognosis.  Patient did undergo therapeutic paracentesis with 8 L removed while admitted.  No evidence of SBP.  Significant abdominal aorta with rapid growth was noted on imaging.  Initially plan was for AAA repair with endovascular approach however after discussion with palliative care the decision was made to proceed with hospice referral and full comfort measures.  Patient will be discharged to hospice facility.  Discharge Instructions  Discharge Instructions     Diet - low sodium heart healthy   Complete by: As directed    Increase activity slowly   Complete by: As directed       Allergies as of 07/22/2020       Reactions   Sulfa Antibiotics Other (See Comments)        Medication List     STOP taking these medications    albuterol (2.5 MG/3ML) 0.083% nebulizer solution Commonly known as: PROVENTIL   lactulose 10 GM/15ML solution Commonly known as: CHRONULAC   ProAir HFA 108 (90 Base) MCG/ACT inhaler Generic drug: albuterol   prochlorperazine 5 MG tablet Commonly known as: COMPAZINE   Spiriva HandiHaler 18 MCG inhalation capsule Generic drug: tiotropium       TAKE these medications    atenolol 25 MG tablet Commonly known as: TENORMIN Take 25 mg by mouth 2 (two) times daily.   furosemide 20 MG tablet Commonly known as: LASIX Take 3 tablets (60 mg total) by mouth daily.   HYDROmorphone 50 mg in sodium chloride 0.9 % 95 mL Inject 2 mg/hr into the vein continuous.   LORazepam 2 MG/ML concentrated solution Commonly known as: ATIVAN  Place 0.5 mLs (1 mg total) under the tongue every 4 (four) hours as needed for anxiety.   LORazepam 1 MG tablet Commonly known as: ATIVAN Take 1 tablet (1 mg total) by mouth every 4 (four) hours as needed for anxiety.   LORazepam 2 MG/ML injection Commonly  known as: ATIVAN Inject 0.5 mLs (1 mg total) into the vein every 4 (four) hours as needed for anxiety.   metoprolol tartrate 5 MG/5ML Soln injection Commonly known as: LOPRESSOR Inject 5 mLs (5 mg total) into the vein every 4 (four) hours as needed (BP > 140/90).   ondansetron 4 MG disintegrating tablet Commonly known as: ZOFRAN-ODT Take 1 tablet (4 mg total) by mouth every 6 (six) hours as needed for nausea. What changed:  medication strength how much to take when to take this reasons to take this   spironolactone 50 MG tablet Commonly known as: ALDACTONE Take 3 tablets (150 mg total) by mouth daily.        Follow-up Information     Schnier, Latina Craver, MD Follow up on 07/23/2020.   Specialties: Vascular Surgery, Cardiology, Radiology, Vascular Surgery Why: with ABI  double book if needed Contact information: [address] [place] [address] [phone]                Allergies  Allergen Reactions   Sulfa Antibiotics Other (See Comments)    Consultations: Palliative care Vascular surgery Pulmonology   Procedures/Studies: CT ABDOMEN PELVIS WO CONTRAST  Result Date: 07/17/2020 CLINICAL DATA:  Abdominal distension, shortness of breath EXAM: CT ABDOMEN AND PELVIS WITHOUT CONTRAST TECHNIQUE: Multidetector CT imaging of the abdomen and pelvis was performed following the standard protocol without IV contrast. COMPARISON:  CT 05/31/2020 FINDINGS: Lower chest: Small right pleural effusion with associated compressive atelectasis. Left lung bases clear. Heart size is normal. Hepatobiliary: Subtle surface nodularity of the liver suggesting underlying cirrhosis. No focal liver lesion is identified on noncontrast study. Cholelithiasis without evidence to suggest cholecystitis. No biliary dilatation. Pancreas: Unremarkable. No pancreatic ductal dilatation or surrounding inflammatory changes. Spleen: Spleen is upper limits of normal in size. Adrenals/Urinary Tract:  Unremarkable adrenal glands. Stable upper pole right renal cyst. Tiny punctate calcification within midpole of the left kidney which may represent a vascular calcification or nonobstructing stone. No hydronephrosis. Urinary bladder is decompressed, limiting its evaluation. Stomach/Bowel: Stomach is within normal limits. Appendix not definitively identified. Colonic diverticulosis. No evidence of bowel wall thickening, distention, or inflammatory changes. Moderate volume stool throughout the colon Vascular/Lymphatic: Aortoiliac atherosclerosis with infrarenal abdominal aortic aneurysm measuring 5.7 cm in maximal transverse dimension (series 2, image 51) previously 5.1 cm. No abdominopelvic lymphadenopathy. Reproductive: Prostate is unremarkable. Other: Large volume ascites. No definite omental or peritoneal masses identified on noncontrast study. No pneumoperitoneum. Paraumbilical hernia containing small bowel without evidence of obstruction. Musculoskeletal: No acute or significant osseous findings. IMPRESSION: 1. Large volume ascites. 2. Subtle surface nodularity of the liver suggesting underlying cirrhosis. 3. Small right pleural effusion with associated compressive atelectasis. 4. Cholelithiasis without evidence to suggest cholecystitis. 5. Colonic diverticulosis without evidence of acute diverticulitis. 6. Paraumbilical hernia containing small bowel without evidence of obstruction. 7. 5.7 cm infrarenal abdominal aortic aneurysm, previously 5.1 cm. Recommend referral to a vascular specialist. This recommendation follows ACR consensus guidelines: White Paper of the ACR Incidental Findings Committee II on Vascular Findings. J Am Coll Radiol 2013; 10:789-794. 8. Aortic atherosclerosis (ICD10-I70.0). Electronically Signed   By: Duanne Guess D.O.   On: 07/17/2020 11:07   CT ABDOMEN  PELVIS W CONTRAST  Result Date: 07/19/2020 CLINICAL DATA:  Acute abdominal pain.  Hypotension. EXAM: CT ABDOMEN AND PELVIS WITH  CONTRAST TECHNIQUE: Multidetector CT imaging of the abdomen and pelvis was performed using the standard protocol following bolus administration of intravenous contrast. CONTRAST:  OMNIPAQUE IOHEXOL 300 MG/ML  SOLN COMPARISON:  05/31/2020 and 07/17/2020 FINDINGS: Lower chest: Small right pleural effusion unchanged to slightly worse. Left lung is clear. Hepatobiliary: Somewhat small nodular liver likely due to cirrhosis. No focal liver mass. Evidence of cholelithiasis with cholesterol stone noted and unchanged. Biliary tree is unremarkable. Pancreas: Normal. Spleen: Normal. Adrenals/Urinary Tract: Adrenal glands are normal. Kidneys are normal in size without hydronephrosis. Right renal cyst unchanged. Ureters and bladder are normal. Stomach/Bowel: Stomach is unremarkable. There is an umbilical hernia containing a short segment of small bowel which is now incarcerated as there is interval development of dilatation of the small bowel loops immediately proximal to entering the hernia sac measuring 5 cm in diameter. Small bowel distal to the hernia sac is normal. Air is present throughout the colon. There is diverticulosis of the sigmoid colon. Vascular/Lymphatic: Calcified plaque over the abdominal aorta with moderate mural thrombus over the distal abdominal aorta. Again noted is patient's distal abdominal aortic aneurysm measuring 4.8 x 5.7 cm in AP and transverse dimension at the same level as measured previously (previously 4.3 x 5.7 cm). Greatest AP dimension is 5.1 cm which is unchanged. No adenopathy. Reproductive: Normal. Other: Moderate ascites without significant change. There are a few areas of soft tissue nodularity associated with ascites concerning for possible peritoneal spread of neoplastic disease/malignant ascites. Musculoskeletal: Degenerative change of the spine and hips. IMPRESSION: 1. Umbilical hernia containing a short segment of small bowel which is now incarcerated as there is interval  development of small bowel obstruction immediately proximal to the hernia sac measuring 5 cm in diameter. 2. Moderate ascites without significant change. There are a few areas of soft tissue nodularity associated with ascites concerning for possible peritoneal spread of neoplastic disease/malignant ascites. Recommend correlation with known underlying neoplasm. 3. Stable distal abdominal aortic aneurysm measuring 4.8 x 5.7 cm in AP and transverse dimension as measured previously (previously 4.3 x 5.7 cm). Recommend referral to a vascular specialist if not already done. This recommendation follows ACR consensus guidelines: White Paper of the ACR Incidental Findings Committee II on Vascular Findings. J Am Coll Radiol 2013; 10:789-794. 4. Cholelithiasis. 5. Right renal cyst unchanged. 6. Colonic diverticulosis. 7. Aortic atherosclerosis. 8. Somewhat small nodular liver possibly due to cirrhosis. Aortic Atherosclerosis (ICD10-I70.0). These results were called by telephone at the time of interpretation on 07/19/2020 at 5:05 pm to provider Louisa Second , who verbally acknowledged these results. Electronically Signed   By: Elberta Fortis M.D.   On: 07/19/2020 17:03   US RENAL  Result Date: 07/19/2020 CLINICAL DATA:  AK I EXAM: RENAL / URINARY TRACT ULTRASOUND COMPLETE COMPARISON:  July 19, 2020 FINDINGS: Right Kidney: Renal measurements: 12.3 x 5.9 x 5.7 cm = volume: 215 mL. Echogenicity is within normal limits. There is a anechoic mass with posterior acoustic enhancement consistent with a benign cyst which measures 2.9 x 3.3 x 3.0 cm. No hydronephrosis. Left Kidney: Renal measurements: 12.0 x 4.4 x 5.4 cm = volume: 150 mL. Echogenicity is within normal limits. There is an echogenic focus consistent with a nonobstructive nephrolithiasis vs calcification as seen on prior CT. No hydronephrosis. Bladder: Appears normal for degree of bladder distention. Other: Incidental note of small to moderate volume  ascites IMPRESSION:  1. No hydronephrosis. 2. Small to moderate volume ascites. Electronically Signed   By: Meda Klinefelter MD   On: 07/19/2020 15:52   US Paracentesis  Result Date: 07/17/2020 INDICATION: Recurrent ascites EXAM: ULTRASOUND GUIDED  PARACENTESIS MEDICATIONS: None. COMPLICATIONS: None immediate. PROCEDURE: Informed written consent was obtained from the patient after a discussion of the risks, benefits and alternatives to treatment. A timeout was performed prior to the initiation of the procedure. Initial ultrasound scanning demonstrates a large amount of ascites within the right lower abdominal quadrant. The right lower abdomen was prepped and draped in the usual sterile fashion. 1% lidocaine was used for local anesthesia. Following this, a 6 Fr Safe-T-Centesis catheter was introduced. An ultrasound image was saved for documentation purposes. The paracentesis was performed. The catheter was removed and a dressing was applied. The patient tolerated the procedure well without immediate post procedural complication. FINDINGS: A total of approximately 8 L of straw-colored clear fluid was removed. Samples were sent to the laboratory as requested by the clinical team. IMPRESSION: Successful ultrasound-guided paracentesis yielding 8 L liters of peritoneal fluid. Electronically Signed   By: Acquanetta Belling M.D.   On: 07/17/2020 17:14   DG Chest Portable 1 View  Result Date: 07/17/2020 CLINICAL DATA:  Shortness of breath. EXAM: PORTABLE CHEST 1 VIEW COMPARISON:  06/01/2020 FINDINGS: The heart size and mediastinal contours are within normal limits. Similar chronic interstitial coarsening within the lower lung zones. Both lungs are clear. The visualized skeletal structures are unremarkable. IMPRESSION: No active cardiopulmonary abnormalities. Electronically Signed   By: Signa Kell M.D.   On: 07/17/2020 10:42   US ABDOMEN LIMITED RUQ (LIVER/GB)  Result Date: 07/17/2020 CLINICAL DATA:  Transaminitis. EXAM: ULTRASOUND  ABDOMEN LIMITED RIGHT UPPER QUADRANT COMPARISON:  CT of same day. FINDINGS: Gallbladder: Cholelithiasis and sludge is noted. No gallbladder wall thickening is noted. No sonographic Murphy's sign is noted. Common bile duct: Diameter: 6 mm which is within normal limits. Liver: No focal lesion identified. Increased echogenicity of hepatic parenchyma is noted with nodular contours suggesting hepatic cirrhosis. Portal vein is patent on color Doppler imaging with normal direction of blood flow towards the liver. Other: Moderate ascites is noted. IMPRESSION: Findings consistent with hepatic cirrhosis. No definite focal sonographic hepatic abnormality is noted. Cholelithiasis and sludge is noted without definite evidence of cholecystitis. Moderate ascites. Electronically Signed   By: Lupita Raider M.D.   On: 07/17/2020 12:39   (Echo, Carotid, EGD, Colonoscopy, ERCP)    Subjective: Patient seen and examined on the day of discharge.  Lengthy conversation with patient and his sister at bedside.  Confirmed that referral to hospice was their wishes and within the goals of care.  They affirmed that it was.  Patient will be discharged to inpatient hospice facility.  Discharge Exam: Vitals:   07/22/20 0900 07/22/20 1134  BP: 116/73 (!) 123/91  Pulse: 93 81  Resp: 19   Temp: 97.9 F (36.6 C) 97.6 F (36.4 C)  SpO2: 98%    Vitals:   07/22/20 0030 07/22/20 0406 07/22/20 0900 07/22/20 1134  BP: 127/79 112/83 116/73 (!) 123/91  Pulse: 95 (!) 103 93 81  Resp: 20 20 19    Temp: 97.9 F (36.6 C) 97.7 F (36.5 C) 97.9 F (36.6 C) 97.6 F (36.4 C)  TempSrc: Oral Oral Oral   SpO2: 100% 100% 98%   Weight:      Height:        General: Pt is alert, awake, not in  acute distress Cardiovascular: RRR, S1/S2 +, no rubs, no gallops Respiratory: CTA bilaterally, no wheezing, no rhonchi Abdominal: Soft, distention noted.  Positive bowel sounds Extremities: no edema, no cyanosis    The results of significant  diagnostics from this hospitalization (including imaging, microbiology, ancillary and laboratory) are listed below for reference.     Microbiology: Recent Results (from the past 240 hour(s))  Resp Panel by RT-PCR (Flu A&B, Covid) Nasopharyngeal Swab     Status: None   Collection Time: 07/17/20 10:04 AM   Specimen: Nasopharyngeal Swab; Nasopharyngeal(NP) swabs in vial transport medium  Result Value Ref Range Status   SARS Coronavirus 2 by RT PCR NEGATIVE NEGATIVE Final    Comment: (NOTE) SARS-CoV-2 target nucleic acids are NOT DETECTED.  The SARS-CoV-2 RNA is generally detectable in upper respiratory specimens during the acute phase of infection. The lowest concentration of SARS-CoV-2 viral copies this assay can detect is 138 copies/mL. A negative result does not preclude SARS-Cov-2 infection and should not be used as the sole basis for treatment or other patient management decisions. A negative result may occur with  improper specimen collection/handling, submission of specimen other than nasopharyngeal swab, presence of viral mutation(s) within the areas targeted by this assay, and inadequate number of viral copies(<138 copies/mL). A negative result must be combined with clinical observations, patient history, and epidemiological information. The expected result is Negative.  Fact Sheet for Patients:  BloggerCourse.com  Fact Sheet for Healthcare Providers:  SeriousBroker.it  This test is no t yet approved or cleared by the [place] FDA and  has been authorized for detection and/or diagnosis of SARS-CoV-2 by FDA under an Emergency Use Authorization (EUA). This EUA will remain  in effect (meaning this test can be used) for the duration of the COVID-19 declaration under Section 564(b)(1) of the Act, 21 U.S.C.section 360bbb-3(b)(1), unless the authorization is terminated  or revoked sooner.       Influenza A by PCR NEGATIVE  NEGATIVE Final   Influenza B by PCR NEGATIVE NEGATIVE Final    Comment: (NOTE) The Xpert Xpress SARS-CoV-2/FLU/RSV plus assay is intended as an aid in the diagnosis of influenza from Nasopharyngeal swab specimens and should not be used as a sole basis for treatment. Nasal washings and aspirates are unacceptable for Xpert Xpress SARS-CoV-2/FLU/RSV testing.  Fact Sheet for Patients: BloggerCourse.com  Fact Sheet for Healthcare Providers: SeriousBroker.it  This test is not yet approved or cleared by the [place] FDA and has been authorized for detection and/or diagnosis of SARS-CoV-2 by FDA under an Emergency Use Authorization (EUA). This EUA will remain in effect (meaning this test can be used) for the duration of the COVID-19 declaration under Section 564(b)(1) of the Act, 21 U.S.C. section 360bbb-3(b)(1), unless the authorization is terminated or revoked.  Performed at [facility], [address] [place], [place] [address]   Blood culture (routine x 2)     Status: None   Collection Time: 07/17/20  1:53 PM   Specimen: BLOOD  Result Value Ref Range Status   Specimen Description BLOOD RIGHT ANTECUBITAL  Final   Special Requests   Final    BOTTLES DRAWN AEROBIC AND ANAEROBIC Blood Culture adequate volume   Culture   Final    NO GROWTH 5 DAYS Performed at [facility], [address], [place], [place] [address]    Report Status 07/22/2020 FINAL  Final  Blood culture (routine x 2)     Status: None   Collection Time: 07/17/20  1:53  PM   Specimen: BLOOD  Result Value Ref Range Status   Specimen Description BLOOD BLOOD RIGHT FOREARM  Final   Special Requests   Final    BOTTLES DRAWN AEROBIC AND ANAEROBIC Blood Culture adequate volume   Culture   Final    NO GROWTH 5 DAYS Performed at [facility], [address], [place], [place] [address]    Report Status 07/22/2020 FINAL  Final  Body fluid  culture w Gram Stain     Status: None   Collection Time: 07/17/20  4:30 PM   Specimen: PATH Cytology Peritoneal fluid  Result Value Ref Range Status   Specimen Description   Final    PERITONEAL Performed at [facility], [address] [place], [place] [address]    Special Requests   Final    NONE Performed at [facility], [address], [place], [place] [address]    Gram Stain NO WBC SEEN NO ORGANISMS SEEN   Final   Culture   Final    NO GROWTH 3 DAYS Performed at [facility], [address], [place], [place] [address]    Report Status 07/21/2020 FINAL  Final     Labs: BNP (last 3 results) Recent Labs    06/02/20 0331 06/03/20 0424  BNP 48.7 50.4   Basic Metabolic Panel: Recent Labs  Lab 07/17/20 1004 07/18/20 0455 07/19/20 0511 07/20/20 0441 07/21/20 0539  NA 127* 129* 129* 129* 129*  K 3.5 3.9 4.2 3.7 3.1*  CL 77* 77* 77* 80* 82*  CO2 32 37* 39* 38* 39*  GLUCOSE 143* 140* 135* 111* 117*  BUN 47* 47* 45* 35* 28*  CREATININE 1.32* 1.40* 1.52* 1.02 0.76  CALCIUM 9.2 9.1 8.8* 8.6* 8.8*  MG 2.4  --   --   --   --    Liver Function Tests: Recent Labs  Lab 07/17/20 1004 07/20/20 0441 07/21/20 0539  AST 111* 58* 56*  ALT 259* 139* 115*  ALKPHOS 111 104 102  BILITOT 2.6* 1.6* 1.5*  PROT 7.3 6.7 6.6  ALBUMIN 3.2* 3.6 3.4*   Recent Labs  Lab 07/17/20 1004  LIPASE 24   Recent Labs  Lab 07/17/20 1142  AMMONIA 11   CBC: Recent Labs  Lab 07/17/20 1004 07/18/20 0455 07/19/20 0511 07/20/20 0441 07/21/20 0539  WBC 15.0* 16.2* 13.3* 11.3* 11.6*  NEUTROABS 13.2*  --   --   --   --   HGB 15.0 16.1 14.7 14.4 13.8  HCT 44.0 47.7 43.5 43.4 42.2  MCV 83.8 84.9 86.5 86.5 86.8  PLT 454* 431* 355 325 319   Cardiac Enzymes: No results for input(s): CKTOTAL, CKMB, CKMBINDEX, TROPONINI in the last 168 hours. BNP: Invalid input(s): POCBNP CBG: No results for input(s): GLUCAP in the last 168 hours. D-Dimer No results for  input(s): DDIMER in the last 72 hours. Hgb A1c No results for input(s): HGBA1C in the last 72 hours. Lipid Profile No results for input(s): CHOL, HDL, LDLCALC, TRIG, CHOLHDL, LDLDIRECT in the last 72 hours. Thyroid function studies No results for input(s): TSH, T4TOTAL, T3FREE, THYROIDAB in the last 72 hours.  Invalid input(s): FREET3 Anemia work up No results for input(s): VITAMINB12, FOLATE, FERRITIN, TIBC, IRON, RETICCTPCT in the last 72 hours. Urinalysis    Component Value Date/Time   COLORURINE AMBER (A) 07/18/2020 1715   APPEARANCEUR CLEAR (A) 07/18/2020 1715   LABSPEC 1.018 07/18/2020 1715   PHURINE 5.0 07/18/2020 1715   GLUCOSEU NEGATIVE 07/18/2020  1715   HGBUR NEGATIVE 07/18/2020 1715   BILIRUBINUR NEGATIVE 07/18/2020 1715   KETONESUR NEGATIVE 07/18/2020 1715   PROTEINUR NEGATIVE 07/18/2020 1715   NITRITE NEGATIVE 07/18/2020 1715   LEUKOCYTESUR NEGATIVE 07/18/2020 1715   Sepsis Labs Invalid input(s): PROCALCITONIN,  WBC,  LACTICIDVEN Microbiology Recent Results (from the past 240 hour(s))  Resp Panel by RT-PCR (Flu A&B, Covid) Nasopharyngeal Swab     Status: None   Collection Time: 07/17/20 10:04 AM   Specimen: Nasopharyngeal Swab; Nasopharyngeal(NP) swabs in vial transport medium  Result Value Ref Range Status   SARS Coronavirus 2 by RT PCR NEGATIVE NEGATIVE Final    Comment: (NOTE) SARS-CoV-2 target nucleic acids are NOT DETECTED.  The SARS-CoV-2 RNA is generally detectable in upper respiratory specimens during the acute phase of infection. The lowest concentration of SARS-CoV-2 viral copies this assay can detect is 138 copies/mL. A negative result does not preclude SARS-Cov-2 infection and should not be used as the sole basis for treatment or other patient management decisions. A negative result may occur with  improper specimen collection/handling, submission of specimen other than nasopharyngeal swab, presence of viral mutation(s) within the areas targeted  by this assay, and inadequate number of viral copies(<138 copies/mL). A negative result must be combined with clinical observations, patient history, and epidemiological information. The expected result is Negative.  Fact Sheet for Patients:  BloggerCourse.com  Fact Sheet for Healthcare Providers:  SeriousBroker.it  This test is no t yet approved or cleared by the [place] FDA and  has been authorized for detection and/or diagnosis of SARS-CoV-2 by FDA under an Emergency Use Authorization (EUA). This EUA will remain  in effect (meaning this test can be used) for the duration of the COVID-19 declaration under Section 564(b)(1) of the Act, 21 U.S.C.section 360bbb-3(b)(1), unless the authorization is terminated  or revoked sooner.       Influenza A by PCR NEGATIVE NEGATIVE Final   Influenza B by PCR NEGATIVE NEGATIVE Final    Comment: (NOTE) The Xpert Xpress SARS-CoV-2/FLU/RSV plus assay is intended as an aid in the diagnosis of influenza from Nasopharyngeal swab specimens and should not be used as a sole basis for treatment. Nasal washings and aspirates are unacceptable for Xpert Xpress SARS-CoV-2/FLU/RSV testing.  Fact Sheet for Patients: BloggerCourse.com  Fact Sheet for Healthcare Providers: SeriousBroker.it  This test is not yet approved or cleared by the [place] FDA and has been authorized for detection and/or diagnosis of SARS-CoV-2 by FDA under an Emergency Use Authorization (EUA). This EUA will remain in effect (meaning this test can be used) for the duration of the COVID-19 declaration under Section 564(b)(1) of the Act, 21 U.S.C. section 360bbb-3(b)(1), unless the authorization is terminated or revoked.  Performed at [facility], [address] [place], [place] [address]   Blood culture (routine x 2)     Status: None   Collection Time:  07/17/20  1:53 PM   Specimen: BLOOD  Result Value Ref Range Status   Specimen Description BLOOD RIGHT ANTECUBITAL  Final   Special Requests   Final    BOTTLES DRAWN AEROBIC AND ANAEROBIC Blood Culture adequate volume   Culture   Final    NO GROWTH 5 DAYS Performed at [facility], [address], [place], [place] [address]    Report Status 07/22/2020 FINAL  Final  Blood culture (routine x 2)     Status: None   Collection Time: 07/17/20  1:53 PM   Specimen: BLOOD  Result Value  Ref Range Status   Specimen Description BLOOD BLOOD RIGHT FOREARM  Final   Special Requests   Final    BOTTLES DRAWN AEROBIC AND ANAEROBIC Blood Culture adequate volume   Culture   Final    NO GROWTH 5 DAYS Performed at [facility], [address] [place], [place] [address]    Report Status 07/22/2020 FINAL  Final  Body fluid culture w Gram Stain     Status: None   Collection Time: 07/17/20  4:30 PM   Specimen: PATH Cytology Peritoneal fluid  Result Value Ref Range Status   Specimen Description   Final    PERITONEAL Performed at [facility], [address] [place], [place] [address]    Special Requests   Final    NONE Performed at [facility], [address] [place], [place] [address]    Gram Stain NO WBC SEEN NO ORGANISMS SEEN   Final   Culture   Final    NO GROWTH 3 DAYS Performed at [facility], [address] [place], [place] [address]    Report Status 07/21/2020 FINAL  Final     Time coordinating discharge: Over 30 minutes  SIGNED:   Tresa Moore, MD  [facility] 07/22/2020, 1:56 PM Pager   If 7PM-7AM, please contact night-coverage

## 2020-07-22 NOTE — Progress Notes (Signed)
[facility])  Referral received for EOL care at [facility].  Met with patient and sister, explained hospice services at [facility].  Seth Haley is appropriate for our residential hospice facility and we will have a bed for him later this afternoon.   His sister will have to meet with our social worker to complete necessary consents on his behalf. Once this is completed, [facility] will update TOC manager so transport can be arranged.  Please leave IV in place.  RN staff, you may call report to [phone] after 1 pm. Room is assigned when report is called.  Please reach out with any concerns.  Venia Carbon RN, BSN, [facility] Liaison

## 2020-07-22 NOTE — Consult Note (Addendum)
Consultation Note Date: 07/22/2020   Patient Name: Seth Haley  DOB: 09-13-1955  MRN: [id number]  Age / Sex: 65 y.o., male  PCP: Simonne Martinet, NP Referring Physician: Tresa Moore, MD  Reason for Consultation: Establishing goals of care  HPI/Patient Profile: Braeson Rupe is a 65 y.o. 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  Patient was recently hospitalized about a month ago and had paracentesis done at the time.  Clinical Assessment and Goals of Care: Patient is resting in bed. He speaks in broken sentences as he is SOB with distended abdomen. He states he is not married and has no children. His sister would be his next of kin. He states he lives alone. He moved here to help his parents before they died.   We discussed his diagnoses, prognosis, GOC, EOL wishes disposition and options.  Created space and opportunity for patient  to explore thoughts and feelings regarding current medical information.   A detailed discussion was had today regarding advanced directives.  Concepts specific to code status, artifical feeding and hydration, IV antibiotics and rehospitalization were discussed.  The difference between an aggressive medical intervention path and a comfort care path was discussed.  Values and goals of care important to patient and family were attempted to be elicited.  Discussed limitations of medical interventions to prolong quality of life in some situations and discussed the concept of human mortality.  He asks questions about comfort care. We discussed various scenarios on  aggressive treatment and comfort care. He would like to proceed with comfort care. He asks to speak with his sister. He places her on his speaker phone. Re-discussed the above. Discussed his QOL past, present, and future. She states he has discussed hospice in the past and she supports this if it is what he wants. They are discussing full comfort care.      SUMMARY OF RECOMMENDATIONS   Patient and family talking about comfort care.  Discussed this with attending team.       Primary Diagnoses: Present on Admission:  SBP (spontaneous bacterial peritonitis) (HCC)  Cirrhosis of liver with ascites (HCC)  Essential hypertension  Obesity, Class III, BMI 40-49.9 (morbid obesity) (HCC)  COPD (chronic obstructive pulmonary disease) (HCC)  Chronic respiratory failure (HCC)  Dehydration with hyponatremia  Transaminitis   I have reviewed the medical record, interviewed the patient and family, and examined the patient. The following aspects are pertinent.  Past Medical History:  Diagnosis Date   COPD (chronic obstructive pulmonary disease) (HCC)    Hypertension    Umbilical hernia    Social History   Socioeconomic History   Marital status: Single    Spouse name: Not on file   Number of children: Not on file   Years  of education: Not on file   Highest education level: Not on file  Occupational History   Not on file  Tobacco Use   Smoking status: Former    Pack years: 0.00   Smokeless tobacco: Never  Substance and Sexual Activity   Alcohol use: Not Currently   Drug use: Not Currently   Sexual activity: Not on file  Other Topics Concern   Not on file  Social History Narrative   Not on file   Social Determinants of Health   Financial Resource Strain: Not on file  Food Insecurity: Not on file  Transportation Needs: Not on file  Physical Activity: Not on file  Stress: Not on file  Social Connections: Not on file   Family History  Problem Relation Age of Onset    Hypertension Mother    Scheduled Meds:  bisacodyl  10 mg Oral Daily   clonazepam  0.5 mg Oral QID   COVID-19 mRNA Vac-TriS ([employer])  0.3 mL Intramuscular ONCE-1600   feeding supplement  237 mL Oral TID BM   lactulose  10 g Oral BID   polyethylene glycol  17 g Oral Daily   Ensure Max Protein  11 oz Oral BID   senna-docusate  2 tablet Oral BID   sodium chloride flush  3 mL Intravenous Q12H   tiotropium  1 capsule Inhalation Daily   traZODone  50 mg Oral QHS   Continuous Infusions:  sodium chloride Stopped (07/20/20 2323)   albumin human 25 g (07/22/20 0400)   PRN Meds:.sodium chloride, albuterol, alum & mag hydroxide-simeth, ondansetron **OR** ondansetron (ZOFRAN) IV, sodium chloride flush Medications Prior to Admission:  Prior to Admission medications   Medication Sig Start Date End Date Taking? Authorizing Provider  albuterol (PROVENTIL) (2.5 MG/3ML) 0.083% nebulizer solution Inhale 3 mLs into the lungs every 6 (six) hours as needed. 10/02/17  Yes [provider]  atenolol (TENORMIN) 25 MG tablet Take 25 mg by mouth 2 (two) times daily. 03/23/20  Yes [provider]  furosemide (LASIX) 20 MG tablet Take 3 tablets (60 mg total) by mouth daily. 06/06/20  Yes Tyrone Nine, MD  SPIRIVA HANDIHALER 18 MCG inhalation capsule Place 1 capsule into inhaler and inhale daily. 03/25/20  Yes [provider]  spironolactone (ALDACTONE) 50 MG tablet Take 3 tablets (150 mg total) by mouth daily. 06/05/20  Yes Tyrone Nine, MD  lactulose (CHRONULAC) 10 GM/15ML solution Take 15 mLs (10 g total) by mouth 2 (two) times daily. Patient not taking: No sig reported 06/05/20   Tyrone Nine, MD  ondansetron (ZOFRAN-ODT) 8 MG disintegrating tablet Take 8 mg by mouth 3 (three) times daily. 06/16/20   [provider]  PROAIR HFA 108 (90 Base) MCG/ACT inhaler Inhale 2 puffs into the lungs every 6 (six) hours as needed for wheezing. 03/26/20   [provider]  prochlorperazine  (COMPAZINE) 5 MG tablet Take 1 tablet (5 mg total) by mouth every 6 (six) hours as needed for refractory nausea / vomiting. Patient not taking: No sig reported 06/05/20   Tyrone Nine, MD   Allergies  Allergen Reactions   Sulfa Antibiotics Other (See Comments)   Review of Systems  Respiratory:  Positive for shortness of breath.   Gastrointestinal:  Positive for abdominal distention and abdominal pain.   Physical Exam Pulmonary:     Comments: Work of breathing noted.  Neurological:     Mental Status: He is alert.    Vital Signs: BP 112/83 (BP  Location: Right Arm)   Pulse (!) 103   Temp 97.7 F (36.5 C) (Oral)   Resp 20   Ht 5\' 6"  (1.676 m)   Wt 127 kg   SpO2 100%   BMI 45.19 kg/m  Pain Scale: 0-10   Pain Score: Asleep   SpO2: SpO2: 100 % O2 Device:SpO2: 100 % O2 Flow Rate: .O2 Flow Rate (L/min): 2 L/min  IO: Intake/output summary:  Intake/Output Summary (Last 24 hours) at 07/22/2020 0825 Last data filed at 07/22/2020 0301 Gross per 24 hour  Intake 130 ml  Output 301 ml  Net -171 ml    LBM: Last BM Date: 07/21/20 Baseline Weight: Weight: 127 kg Most recent weight: Weight: 127 kg       Time In: 4:00 Time Out: 4:50 Time Total: 50 min Greater than 50%  of this time was spent counseling and coordinating care related to the above assessment and plan.  Signed by: 07/23/20, NP   Please contact Palliative Medicine Team phone at [phone] for questions and concerns.  For individual provider: See [phone]

## 2020-07-22 NOTE — Progress Notes (Signed)
Pulmonary and Critical Care          Date: 07/22/2020,   MRN# [id number] Seth Haley [facility] [birth year]     AdmissionWeight: 127 kg                 CurrentWeight: 127 kg   Referring physician: Dr Feliberto Gottron   CHIEF COMPLAINT:   Pulmonary preoperative evaluation for vascular surgery.   HISTORY OF PRESENT ILLNESS   This is a pleasant 65 year old male with a history of COPD, dyslipidemia and previous umbilical hernia, he has chronic hypoxemia and uses supplemental oxygen at home at 2 L/min nasal cannula, also has significant history with liver cirrhosis and portal hypertension as well as splenomegaly and actually came in on this admission with abdominal discomfort swelling and distention.  Associated with abdominal discomfort is worsening shortness of breath.  In the emergency room family had shared that patient has been declining clinically and has become physically deconditioned with severe fatigue and malaise.  His lab work is significant for severe hyponatremia in the 120s, renal failure with creatinine 1.3 and transaminitis.  Liver synthetic function including INR and albumin are within reference range.  Patient had CT of the abdomen performed which showed large volume ascites, right pleural effusion and compressive atelectasis, diverticulosis periumbilical hernia containing small bowel, and a significant 5.7 cm infrarenal AAA which was previously 5.1 cm.  Pulmonary consultation placed for additional evaluation and risk stratification for vascular surgery AAA repair. Sister at bedside we rewviewed care plan together with patient.  Patient generally sees pulmonary at [facility] with Dr Raul Del.     07/20/20- patient reports improvement clinically , he seems somewhat sedated but calm,  he shares he recently started Klonopin due to anxiety associated with advanced COPD and severe dyspnea.    07/21/20- patient seen and evaluated at bedside.  Patient has met with palliative care team and had  goal of care done. He shares he really is considering comfort measures and is in communication with sister to discuss what he wants.  He said to me he does not want to have any surgeries.  His sister joined conversation via phone and she is making medical decisions.  She said to me that she feels surgery is not going to change the fact that patient should be in hospice.   07/22/20- patient is stable from respiratory prespective.  He will be transitioning to conservative measures only.    PAST MEDICAL HISTORY   Past Medical History:  Diagnosis Date   COPD (chronic obstructive pulmonary disease) ([place])    Hypertension    Umbilical hernia      SURGICAL HISTORY   History reviewed. No pertinent surgical history.   FAMILY HISTORY   Family History  Problem Relation Age of Onset   Hypertension Mother      SOCIAL HISTORY   Social History   Tobacco Use   Smoking status: Former    Pack years: 0.00   Smokeless tobacco: Never  Substance Use Topics   Alcohol use: Not Currently   Drug use: Not Currently     MEDICATIONS      Current Medication:  Current Facility-Administered Medications:    0.9 %  sodium chloride infusion, 250 mL, Intravenous, PRN, Agbata, Tochukwu, MD, Stopped at 07/20/20 2323   albumin human 25 % solution 25 g, 25 g, Intravenous, Q8H, Lateef, Munsoor, MD, Last Rate: 60 mL/hr at 07/22/20 0400, 25 g at 07/22/20 0400   albuterol (PROVENTIL) (2.5 MG/3ML) 0.083% nebulizer solution  3 mL, 3 mL, Inhalation, Q6H PRN, Agbata, Tochukwu, MD, 3 mL at 07/19/20 0211   alum & mag hydroxide-simeth (MAALOX/MYLANTA) 200-200-20 MG/5ML suspension 30 mL, 30 mL, Oral, Q6H PRN, Max Sane, MD   bisacodyl (DULCOLAX) EC tablet 10 mg, 10 mg, Oral, Daily, Max Sane, MD, 10 mg at 07/19/20 0936   clonazePAM (KLONOPIN) disintegrating tablet 0.5 mg, 0.5 mg, Oral, QID, Max Sane, MD, 0.5 mg at 07/21/20 2048   COVID-19 mRNA Vac-TriS (Pfizer) injection 0.3 mL, 0.3 mL, Intramuscular,  ONCE-1600, Manuella Ghazi, Vipul, MD   feeding supplement (ENSURE ENLIVE / ENSURE PLUS) liquid 237 mL, 237 mL, Oral, TID BM, Max Sane, MD, 237 mL at 07/21/20 2037   lactulose (CHRONULAC) 10 GM/15ML solution 10 g, 10 g, Oral, BID, Agbata, Tochukwu, MD, 10 g at 07/21/20 2048   ondansetron (ZOFRAN) tablet 4 mg, 4 mg, Oral, Q6H PRN **OR** ondansetron (ZOFRAN) injection 4 mg, 4 mg, Intravenous, Q6H PRN, Agbata, Tochukwu, MD, 4 mg at 07/20/20 0040   polyethylene glycol (MIRALAX / GLYCOLAX) packet 17 g, 17 g, Oral, Daily, Manuella Ghazi, Vipul, MD, 17 g at 07/19/20 0936   protein supplement (ENSURE MAX) liquid, 11 oz, Oral, BID, Max Sane, MD, 11 oz at 07/19/20 1600   senna-docusate (Senokot-S) tablet 2 tablet, 2 tablet, Oral, BID, Max Sane, MD, 2 tablet at 07/21/20 2048   sodium chloride flush (NS) 0.9 % injection 3 mL, 3 mL, Intravenous, Q12H, Agbata, Tochukwu, MD, 3 mL at 07/20/20 2324   sodium chloride flush (NS) 0.9 % injection 3 mL, 3 mL, Intravenous, PRN, Agbata, Tochukwu, MD   tiotropium (SPIRIVA) inhalation capsule ([facility] use ONLY) 18 mcg, 1 capsule, Inhalation, Daily, Agbata, Tochukwu, MD, 18 mcg at 07/21/20 1047   traZODone (DESYREL) tablet 50 mg, 50 mg, Oral, QHS, Max Sane, MD, 50 mg at 07/21/20 2048    ALLERGIES   Sulfa antibiotics     REVIEW OF SYSTEMS    Review of Systems:  Gen:  Denies  fever, sweats, chills weigh loss  HEENT: Denies blurred vision, double vision, ear pain, eye pain, hearing loss, nose bleeds, sore throat Cardiac:  No dizziness, chest pain or heaviness, chest tightness,edema Resp:   Denies cough or sputum porduction, shortness of breath,wheezing, hemoptysis,  Gi: Denies swallowing difficulty, stomach pain, nausea or vomiting, diarrhea, constipation, bowel incontinence Gu:  Denies bladder incontinence, burning urine Ext:   Denies Joint pain, stiffness or swelling Skin: Denies  skin rash, easy bruising or bleeding or hives Endoc:  Denies polyuria, polydipsia ,  polyphagia or weight change Psych:   Denies depression, insomnia or hallucinations   Other:  All other systems negative   VS: BP 112/83 (BP Location: Right Arm)   Pulse (!) 103   Temp 97.7 F (36.5 C) (Oral)   Resp 20   Ht _0  (1.676 m)   Wt 127 kg   SpO2 100%   BMI 45.19 kg/m      PHYSICAL EXAM    GENERAL:NAD, no fevers, chills, no weakness no fatigue HEAD: Normocephalic, atraumatic.  EYES: Pupils equal, round, reactive to light. Extraocular muscles intact. No scleral icterus.  MOUTH: Moist mucosal membrane. Dentition intact. No abscess noted.  EAR, NOSE, THROAT: Clear without exudates. No external lesions.  NECK: Supple. No thyromegaly. No nodules. No JVD.  PULMONARY: mild rhonchi bilaterally  CARDIOVASCULAR: S1 and S2. Regular rate and rhythm. No murmurs, rubs, or gallops. No edema. Pedal pulses 2+ bilaterally.  GASTROINTESTINAL: Soft, nontender, nondistended. No masses. Positive bowel sounds. No hepatosplenomegaly.  MUSCULOSKELETAL:  No swelling, clubbing, or edema. Range of motion full in all extremities.  NEUROLOGIC: Cranial nerves II through XII are intact. No gross focal neurological deficits. Sensation intact. Reflexes intact.  SKIN: No ulceration, lesions, rashes, or cyanosis. Skin warm and dry. Turgor intact.  PSYCHIATRIC: Mood, affect within normal limits. The patient is awake, alert and oriented x 3. Insight, judgment intact.       IMAGING    CT ABDOMEN PELVIS WO CONTRAST  Result Date: 07/17/2020 CLINICAL DATA:  Abdominal distension, shortness of breath EXAM: CT ABDOMEN AND PELVIS WITHOUT CONTRAST TECHNIQUE: Multidetector CT imaging of the abdomen and pelvis was performed following the standard protocol without IV contrast. COMPARISON:  CT 05/31/2020 FINDINGS: Lower chest: Small right pleural effusion with associated compressive atelectasis. Left lung bases clear. Heart size is normal. Hepatobiliary: Subtle surface nodularity of the liver suggesting  underlying cirrhosis. No focal liver lesion is identified on noncontrast study. Cholelithiasis without evidence to suggest cholecystitis. No biliary dilatation. Pancreas: Unremarkable. No pancreatic ductal dilatation or surrounding inflammatory changes. Spleen: Spleen is upper limits of normal in size. Adrenals/Urinary Tract: Unremarkable adrenal glands. Stable upper pole right renal cyst. Tiny punctate calcification within midpole of the left kidney which may represent a vascular calcification or nonobstructing stone. No hydronephrosis. Urinary bladder is decompressed, limiting its evaluation. Stomach/Bowel: Stomach is within normal limits. Appendix not definitively identified. Colonic diverticulosis. No evidence of bowel wall thickening, distention, or inflammatory changes. Moderate volume stool throughout the colon Vascular/Lymphatic: Aortoiliac atherosclerosis with infrarenal abdominal aortic aneurysm measuring 5.7 cm in maximal transverse dimension (series 2, image 51) previously 5.1 cm. No abdominopelvic lymphadenopathy. Reproductive: Prostate is unremarkable. Other: Large volume ascites. No definite omental or peritoneal masses identified on noncontrast study. No pneumoperitoneum. Paraumbilical hernia containing small bowel without evidence of obstruction. Musculoskeletal: No acute or significant osseous findings. IMPRESSION: 1. Large volume ascites. 2. Subtle surface nodularity of the liver suggesting underlying cirrhosis. 3. Small right pleural effusion with associated compressive atelectasis. 4. Cholelithiasis without evidence to suggest cholecystitis. 5. Colonic diverticulosis without evidence of acute diverticulitis. 6. Paraumbilical hernia containing small bowel without evidence of obstruction. 7. 5.7 cm infrarenal abdominal aortic aneurysm, previously 5.1 cm. Recommend referral to a vascular specialist. This recommendation follows ACR consensus guidelines: White Paper of the ACR Incidental Findings  Committee II on Vascular Findings. J Am Coll Radiol 2013; 10:789-794. 8. Aortic atherosclerosis (ICD10-I70.0). Electronically Signed   By: Davina Poke D.O.   On: 07/17/2020 11:07   CT ABDOMEN PELVIS W CONTRAST  Result Date: 07/19/2020 CLINICAL DATA:  Acute abdominal pain.  Hypotension. EXAM: CT ABDOMEN AND PELVIS WITH CONTRAST TECHNIQUE: Multidetector CT imaging of the abdomen and pelvis was performed using the standard protocol following bolus administration of intravenous contrast. CONTRAST:  117m OMNIPAQUE IOHEXOL 300 MG/ML  SOLN COMPARISON:  05/31/2020 and 07/17/2020 FINDINGS: Lower chest: Small right pleural effusion unchanged to slightly worse. Left lung is clear. Hepatobiliary: Somewhat small nodular liver likely due to cirrhosis. No focal liver mass. Evidence of cholelithiasis with cholesterol stone noted and unchanged. Biliary tree is unremarkable. Pancreas: Normal. Spleen: Normal. Adrenals/Urinary Tract: Adrenal glands are normal. Kidneys are normal in size without hydronephrosis. Right renal cyst unchanged. Ureters and bladder are normal. Stomach/Bowel: Stomach is unremarkable. There is an umbilical hernia containing a short segment of small bowel which is now incarcerated as there is interval development of dilatation of the small bowel loops immediately proximal to entering the hernia sac measuring 5 cm in diameter. Small bowel distal to the hernia sac  is normal. Air is present throughout the colon. There is diverticulosis of the sigmoid colon. Vascular/Lymphatic: Calcified plaque over the abdominal aorta with moderate mural thrombus over the distal abdominal aorta. Again noted is patient's distal abdominal aortic aneurysm measuring 4.8 x 5.7 cm in AP and transverse dimension at the same level as measured previously (previously 4.3 x 5.7 cm). Greatest AP dimension is 5.1 cm which is unchanged. No adenopathy. Reproductive: Normal. Other: Moderate ascites without significant change. There are a  few areas of soft tissue nodularity associated with ascites concerning for possible peritoneal spread of neoplastic disease/malignant ascites. Musculoskeletal: Degenerative change of the spine and hips. IMPRESSION: 1. Umbilical hernia containing a short segment of small bowel which is now incarcerated as there is interval development of small bowel obstruction immediately proximal to the hernia sac measuring 5 cm in diameter. 2. Moderate ascites without significant change. There are a few areas of soft tissue nodularity associated with ascites concerning for possible peritoneal spread of neoplastic disease/malignant ascites. Recommend correlation with known underlying neoplasm. 3. Stable distal abdominal aortic aneurysm measuring 4.8 x 5.7 cm in AP and transverse dimension as measured previously (previously 4.3 x 5.7 cm). Recommend referral to a vascular specialist if not already done. This recommendation follows ACR consensus guidelines: White Paper of the ACR Incidental Findings Committee II on Vascular Findings. J Am Coll Radiol 2013; 10:789-794. 4. Cholelithiasis. 5. Right renal cyst unchanged. 6. Colonic diverticulosis. 7. Aortic atherosclerosis. 8. Somewhat small nodular liver possibly due to cirrhosis. Aortic Atherosclerosis (ICD10-I70.0). These results were called by telephone at the time of interpretation on 07/19/2020 at 5:05 pm to provider Elmore Guise , who verbally acknowledged these results. Electronically Signed   By: Marin Olp M.D.   On: 07/19/2020 17:03   US RENAL  Result Date: 07/19/2020 CLINICAL DATA:  AK I EXAM: RENAL / URINARY TRACT ULTRASOUND COMPLETE COMPARISON:  July 19, 2020 FINDINGS: Right Kidney: Renal measurements: 12.3 x 5.9 x 5.7 cm = volume: 215 mL. Echogenicity is within normal limits. There is a anechoic mass with posterior acoustic enhancement consistent with a benign cyst which measures 2.9 x 3.3 x 3.0 cm. No hydronephrosis. Left Kidney: Renal measurements: 12.0 x 4.4 x  5.4 cm = volume: 150 mL. Echogenicity is within normal limits. There is an echogenic focus consistent with a nonobstructive nephrolithiasis vs calcification as seen on prior CT. No hydronephrosis. Bladder: Appears normal for degree of bladder distention. Other: Incidental note of small to moderate volume ascites IMPRESSION: 1. No hydronephrosis. 2. Small to moderate volume ascites. Electronically Signed   By: Valentino Saxon MD   On: 07/19/2020 15:52   US Paracentesis  Result Date: 07/17/2020 INDICATION: Recurrent ascites EXAM: ULTRASOUND GUIDED  PARACENTESIS MEDICATIONS: None. COMPLICATIONS: None immediate. PROCEDURE: Informed written consent was obtained from the patient after a discussion of the risks, benefits and alternatives to treatment. A timeout was performed prior to the initiation of the procedure. Initial ultrasound scanning demonstrates a large amount of ascites within the right lower abdominal quadrant. The right lower abdomen was prepped and draped in the usual sterile fashion. 1% lidocaine was used for local anesthesia. Following this, a 6 Fr Safe-T-Centesis catheter was introduced. An ultrasound image was saved for documentation purposes. The paracentesis was performed. The catheter was removed and a dressing was applied. The patient tolerated the procedure well without immediate post procedural complication. FINDINGS: A total of approximately 8 L of straw-colored clear fluid was removed. Samples were sent to the laboratory as requested by  the clinical team. IMPRESSION: Successful ultrasound-guided paracentesis yielding 8 L liters of peritoneal fluid. Electronically Signed   By: Miachel Roux M.D.   On: 07/17/2020 17:14   DG Chest Portable 1 View  Result Date: 07/17/2020 CLINICAL DATA:  Shortness of breath. EXAM: PORTABLE CHEST 1 VIEW COMPARISON:  06/01/2020 FINDINGS: The heart size and mediastinal contours are within normal limits. Similar chronic interstitial coarsening within the lower  lung zones. Both lungs are clear. The visualized skeletal structures are unremarkable. IMPRESSION: No active cardiopulmonary abnormalities. Electronically Signed   By: Kerby Moors M.D.   On: 07/17/2020 10:42   US ABDOMEN LIMITED RUQ (LIVER/GB)  Result Date: 07/17/2020 CLINICAL DATA:  Transaminitis. EXAM: ULTRASOUND ABDOMEN LIMITED RIGHT UPPER QUADRANT COMPARISON:  CT of same day. FINDINGS: Gallbladder: Cholelithiasis and sludge is noted. No gallbladder wall thickening is noted. No sonographic Murphy's sign is noted. Common bile duct: Diameter: 6 mm which is within normal limits. Liver: No focal lesion identified. Increased echogenicity of hepatic parenchyma is noted with nodular contours suggesting hepatic cirrhosis. Portal vein is patent on color Doppler imaging with normal direction of blood flow towards the liver. Other: Moderate ascites is noted. IMPRESSION: Findings consistent with hepatic cirrhosis. No definite focal sonographic hepatic abnormality is noted. Cholelithiasis and sludge is noted without definite evidence of cholecystitis. Moderate ascites. Electronically Signed   By: Marijo Conception M.D.   On: 07/17/2020 12:39      ASSESSMENT/PLAN   Pulmonary preoperative evaluation-vascular surgery for abdominal aortic aneurysm 1.ARISCAT - (CANET) -PREOPERATIVE PULMONARY RISK INDEX IN ADULTS : -13.3%-moderate risk of any pulmonary complication including worsening atelectasis, increased O2 requirement, increased length of stay.  This is due to abdominal location of surgery, patient's age, baseline hypoxemia and chronic lung disease 2.[place] RESPIRATORY FAILURE INDEX -10.1%-moderate risk of postoperative respiratory failure-this is due to age, history of chronic lung disease, BUN over 30, abdominal aortic aneurysm surgery 3. GUPTA surgical risk calculator for postoperative respiratory failure (PRF)-13.3 moderate risk -due to AAA procedure, and comorbid functional state   Advanced COPD with  chronic hypoxemia  -Currently on COPD care path with nebulizer every 6 as needed with albuterol with home Spiriva once daily also empirically being covered with Rocephin -Encourage use of incentive spirometer -PT OT as able -avoid sedating medications/avoid centrally acting medication   Pleural effusions -Status post paracentesis this should also help reduce effusions, recommend incentive spirometer and consider trial of diuresis however patient is noted with AKI   Bibasilar atelectasis -Patient should be encouraged to maximize physical therapy while inpatient as well as utilize flutter valve and incentive spirometry several times each hour especially postoperatively   Obstructive sleep apnea -Nightly CPAP settings per RT-symptoms tend to be worse postoperatively please encourage patient to comply with CPAP device    Acute kidney injury stage II Contributing to fluid balance impairment and effusion/ascites formation -Consider renal evaluation to optimize function prior to vascular surgery as this may get worse if patient has significant blood loss and transient decreased perfusion   Severe abdominal ascites Status post paracentesis with improvement -Profile suggestive of SBT and is on Rocephin                  -Fluid count with >3k nucleated cells at 7% PMN with elevated LDH     -agree with IV Rocephin   Anxiety NOS and insomnia Patient on multiple centrally acting medications which may together further hinder respiratory drive in the context of advanced chronic lung disease chronic hypoxemia and  obstructive sleep apnea.   Severe hyponatremia-improving Hypervolemic recommend evaluation with nephrology and nonrapid correction -urine /serum osm, total cholesterol, TSH, ACTH, cortisol   Abdominal aortic aneurysm without rupture --Vascular surgery on case-plan for repair undergoing clearance due to significant comorbid status with multiple provider evaluations in progress EVAR  surgery for rapidly growing urgent AAA.      Thank you for allowing me to participate in the care of this patient.    Patient/Family are satisfied with care plan and all questions have been answered.  This document was prepared using Dragon voice recognition software and may include unintentional dictation errors.     Ottie Glazier, M.D.  Division of [facility]

## 2020-07-22 NOTE — Progress Notes (Signed)
Nutrition Follow-up - Brief Note  New MD consult received to evaluate nutrition needs. Pt previously evaluated by RD 6/12. Upon chart review, pt has decided to forgo his planned aneurysm repair and pursue comfort care. Pt will be transferred to hospice home this afternoon. No aggressive nutrition interventions warranted at this time. Would allow pt to eat and drink as he shows interest - palliative care noted he is not eating at this time.   No further nutrition interventions planned. Please submit a new consult if GOC change.  Greig Castilla, RD, LDN Clinical Dietitian Pager on Amion

## 2020-07-22 NOTE — Progress Notes (Signed)
Report called to Clydie Braun, Charity fundraiser at [facility]).

## 2020-07-22 NOTE — TOC Transition Note (Signed)
Transition of Care [facility]) - CM/SW Discharge Note   Patient Details  Name: Seth Haley MRN: [id number] Date of Birth: October 03, 1955  [facility] CM/SW Contact:  [name], RN Phone Number: 07/22/2020, 2:22 PM   Clinical Narrative:     Patient to discharge to [facility] home today Sister [name] updated Transport called for 530 pm Bedside RN has called report EMS packet printed Signed DNR on chart   Final next level of care: Hospice Medical Facility     Patient Goals and CMS Choice        Discharge Placement                Patient to be transferred to facility by: EMS Name of family member notified: [name]    Discharge Plan and Services                                     Social Determinants of Health (SDOH) Interventions     Readmission Risk Interventions No flowsheet data found.

## 2023-04-08 IMAGING — US US PARACENTESIS
1 series · 5 of 5 positions shown · non-contrast
Comparison: none

INDICATION: Patient with history of hypertension and former alcohol use.
Presented to the emergency department with abdominal pain and
distension. Request is for therapeutic and diagnostic paracentesis.
Maximum of 5 L.

[Series 1: us paracentesis · 5 of 5 slices shown]
[im 1/5]
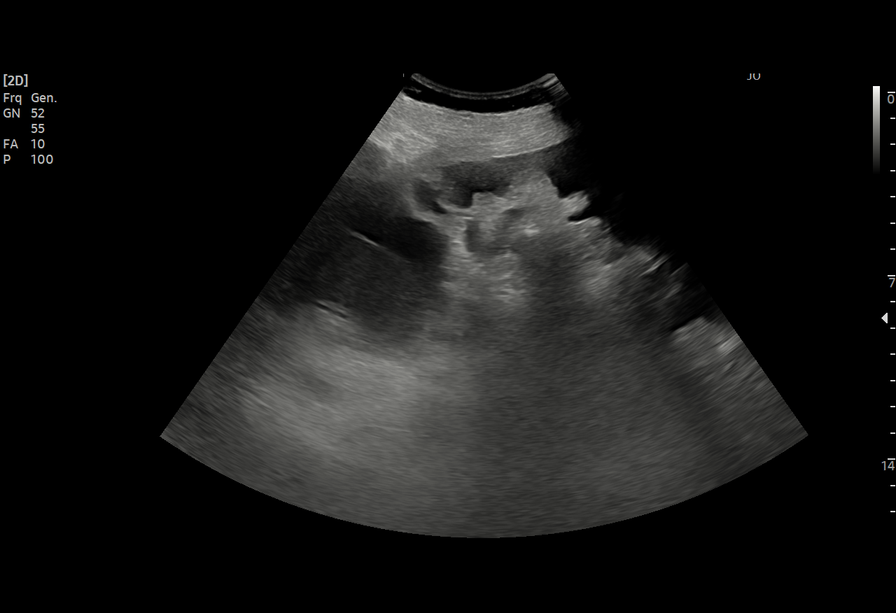
[im 2/5]
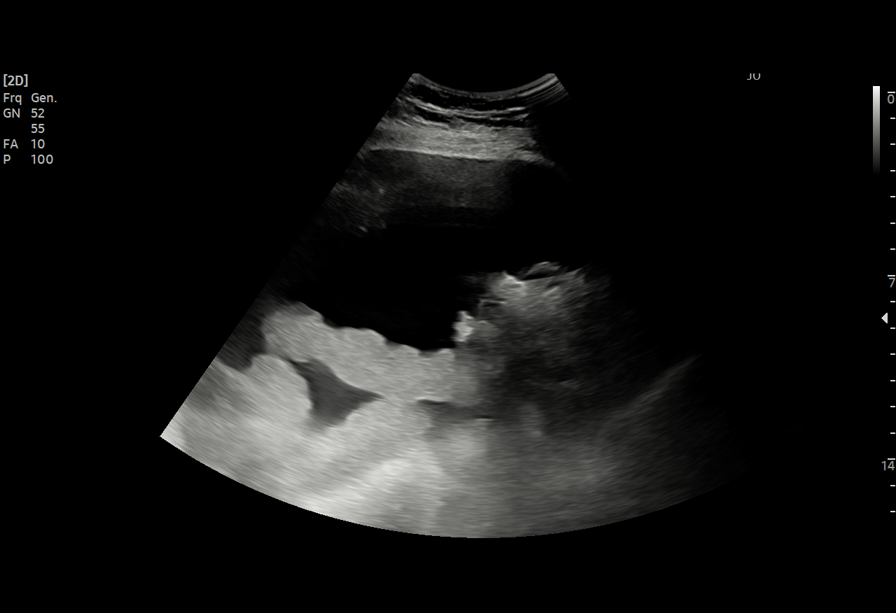
[im 3/5]
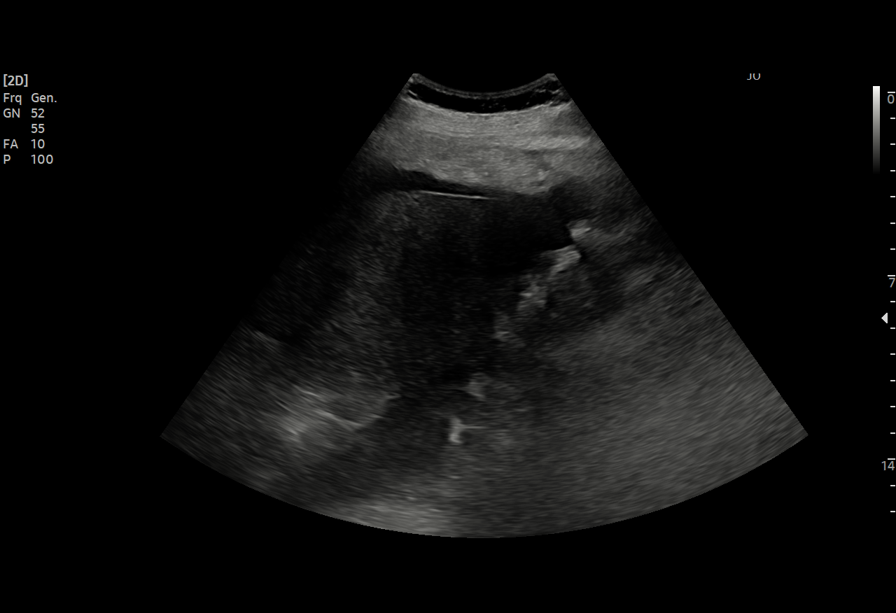
[im 4/5]
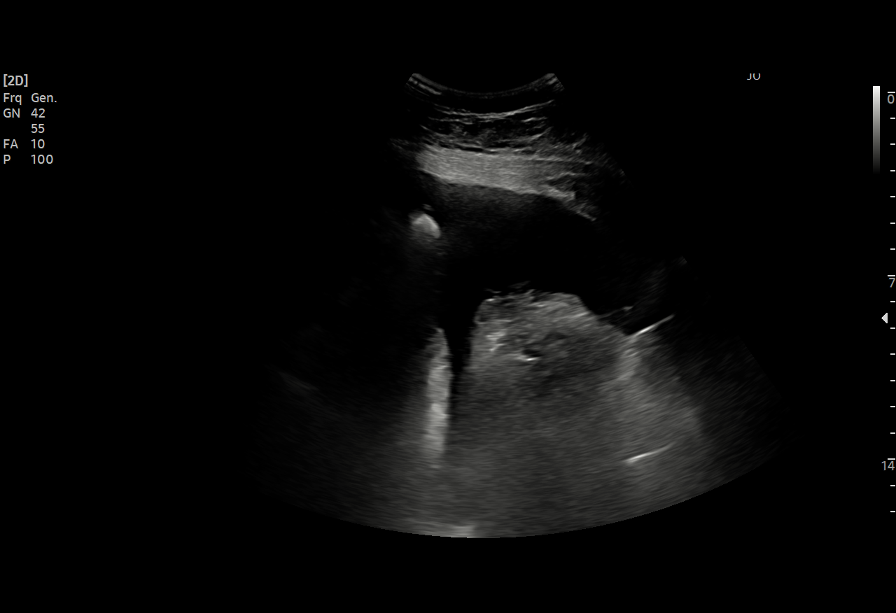
[im 5/5]
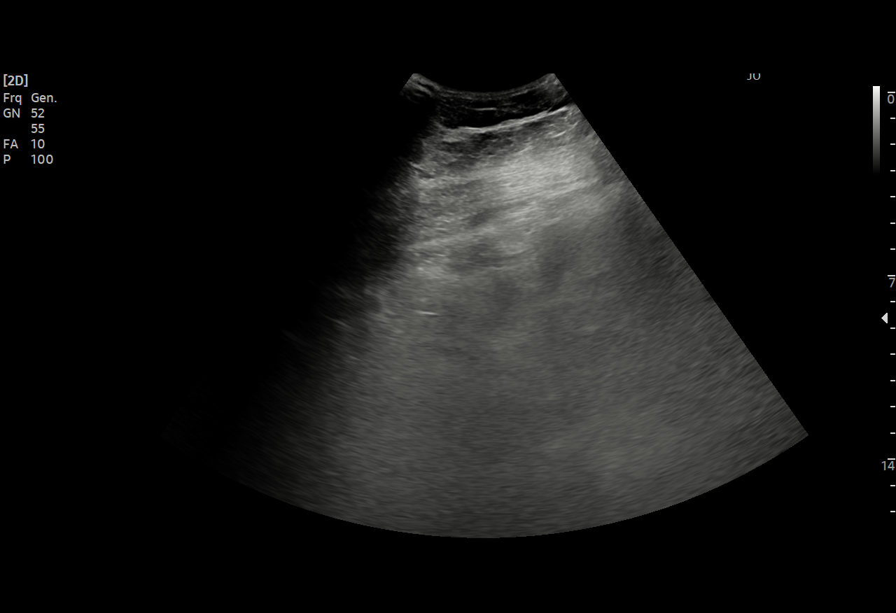

[5 of 5 positions shown; findings below may reference images not displayed]

EXAM:
ULTRASOUND GUIDED THERAPEUTIC AND DIAGNOSTIC PARACENTESIS

MEDICATIONS:
Lidocaine 1% 10 mL

COMPLICATIONS:
None immediate.

PROCEDURE:
Informed written consent was obtained from the patient after a
discussion of the risks, benefits and alternatives to treatment. A
timeout was performed prior to the initiation of the procedure.

Initial ultrasound scanning demonstrates a large amount of ascites
within the right lower abdominal quadrant. The right lower abdomen
was prepped and draped in the usual sterile fashion. 1% lidocaine
was used for local anesthesia.

Following this, a 19 gauge, 7-cm, Yueh catheter was introduced. An
ultrasound image was saved for documentation purposes. The
paracentesis was performed. The catheter was removed and a dressing
was applied. The patient tolerated the procedure well without
immediate post procedural complication.
FINDINGS: A total of approximately 5 L of straw-colored fluid was removed.
Samples were sent to the laboratory as requested by the clinical
team.
IMPRESSION: Successful ultrasound-guided therapeutic and diagnostic paracentesis
yielding 5 L liters of peritoneal fluid.

Read by: Moses Yoshida, NP

## 2023-04-09 IMAGING — US US ABDOMEN LIMITED RUQ/ASCITES
1 series · 8 of 8 positions shown · non-contrast
Comparison: 06/01/2020

CLINICAL DATA: Abdominal distension, paracentesis the previous day

EXAM:
LIMITED ABDOMEN ULTRASOUND FOR ASCITES
TECHNIQUE: Limited ultrasound survey for ascites was performed in all four
abdominal quadrants.

[Series 1: us ascites (abdomen limited) · 8 of 8 slices shown]
[im 1/8]
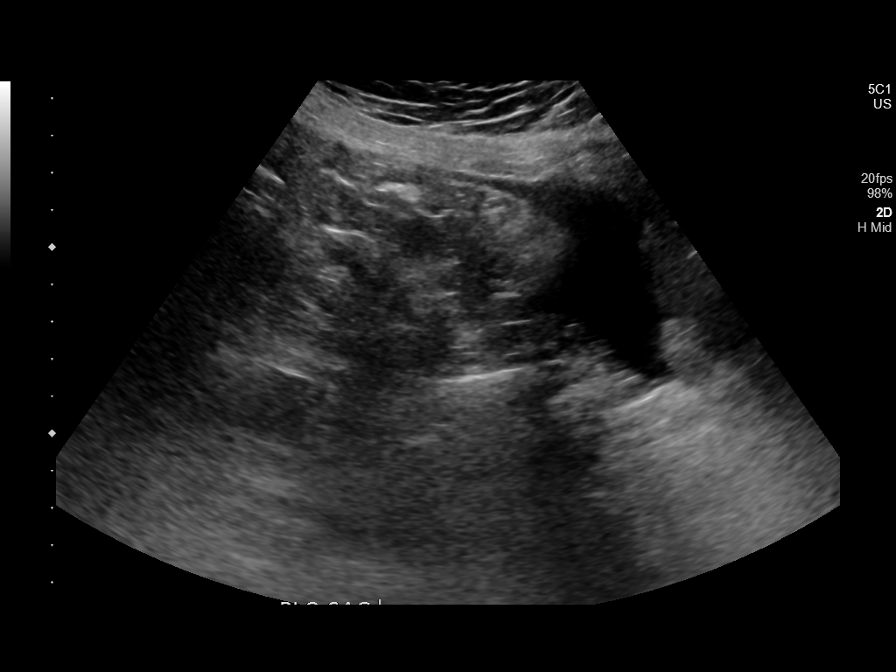
[im 2/8]
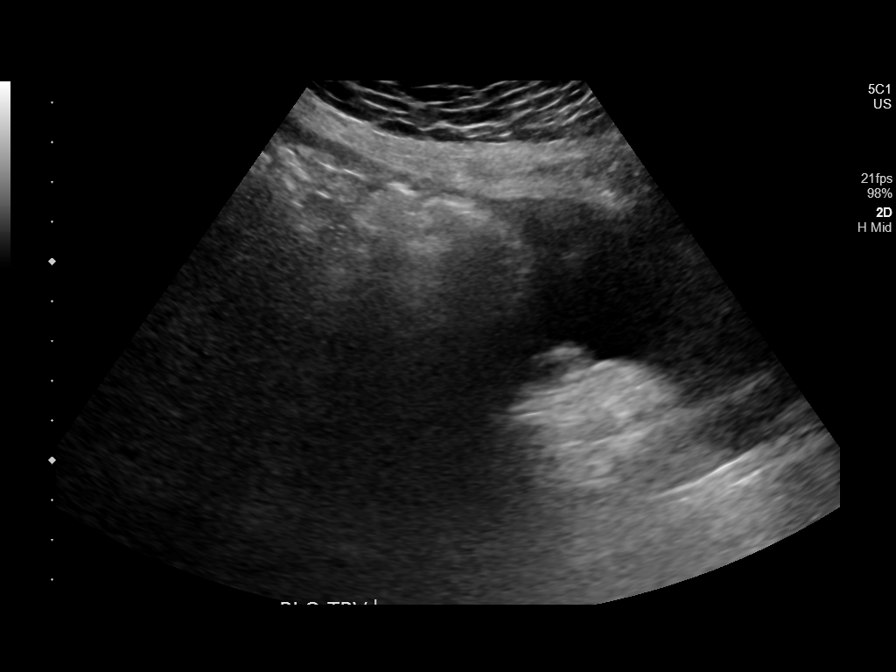
[im 3/8]
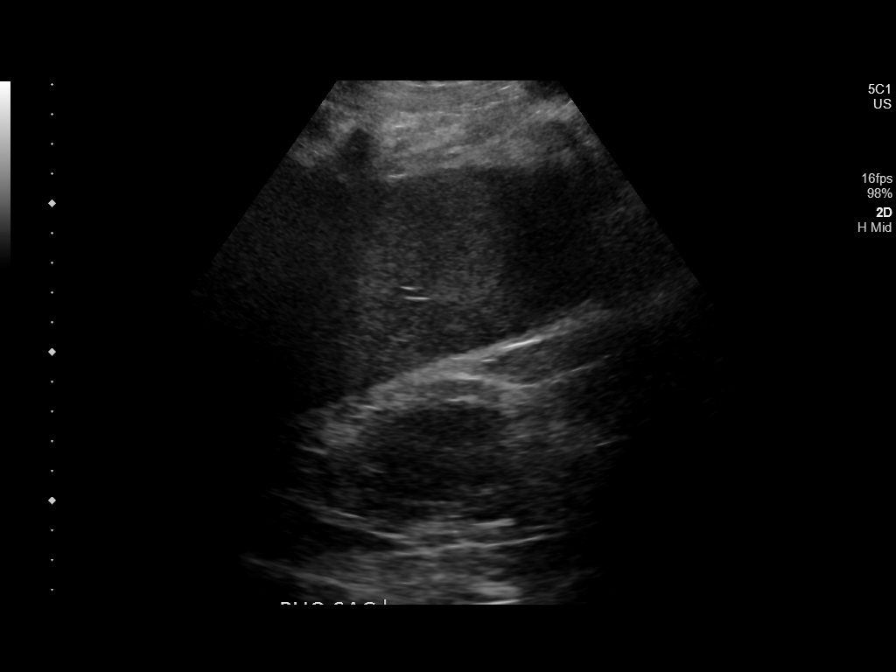
[im 4/8]
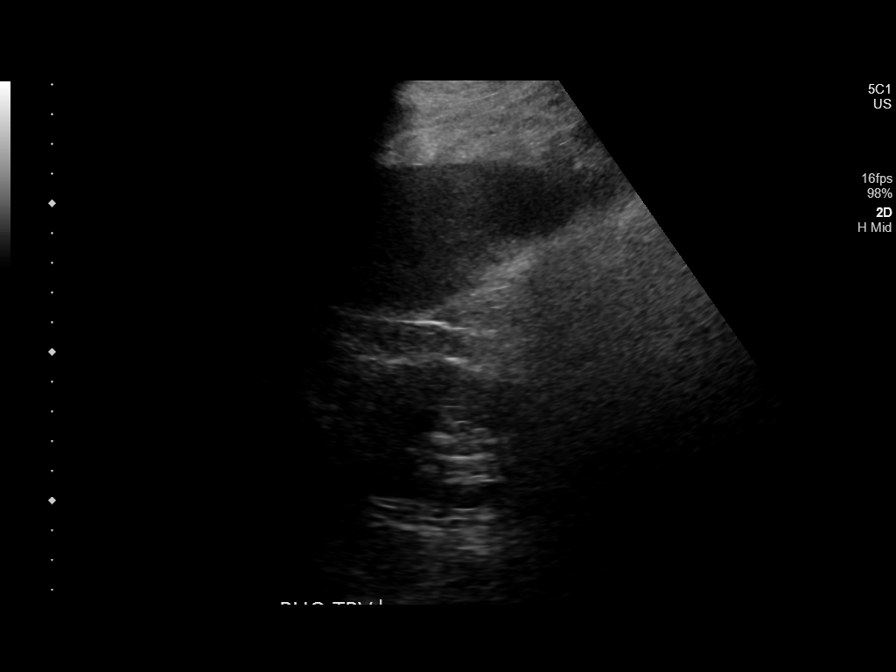
[im 5/8]
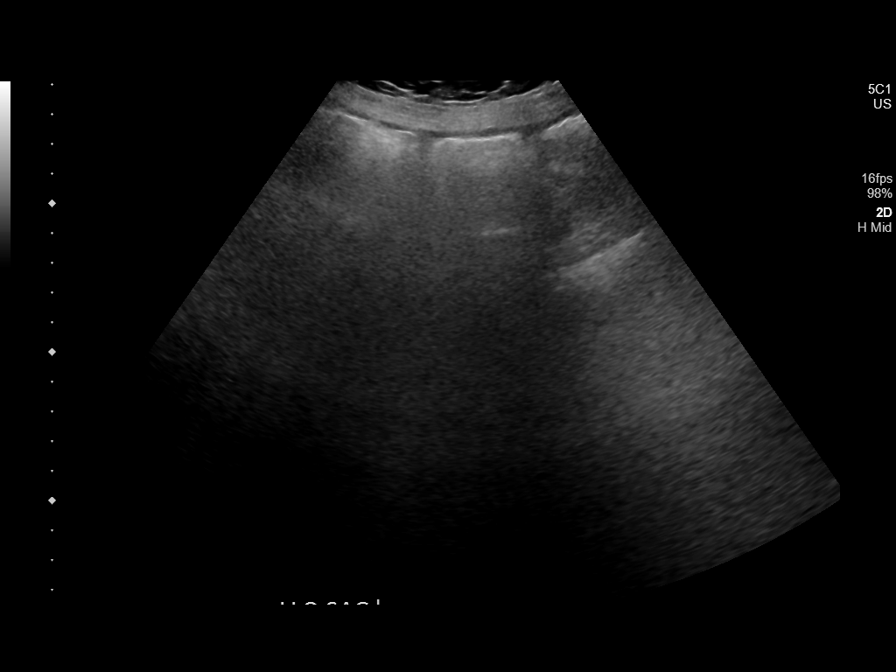
[im 6/8]
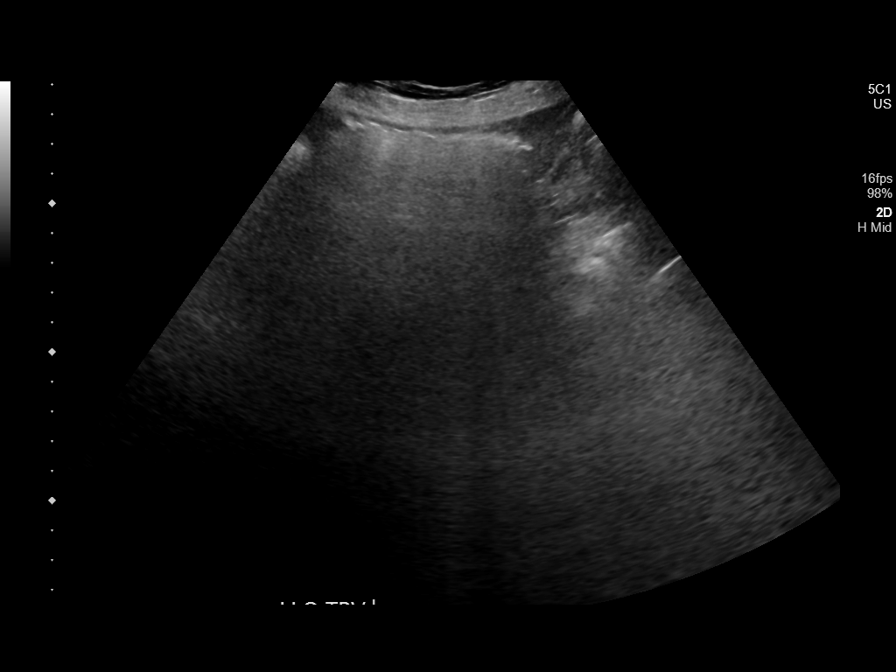
[im 7/8]
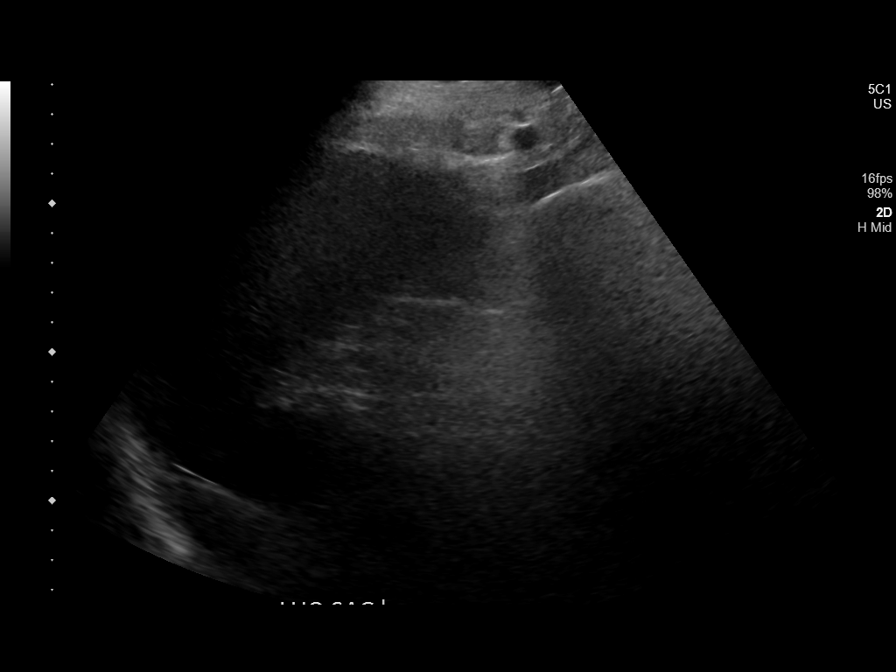
[im 8/8]
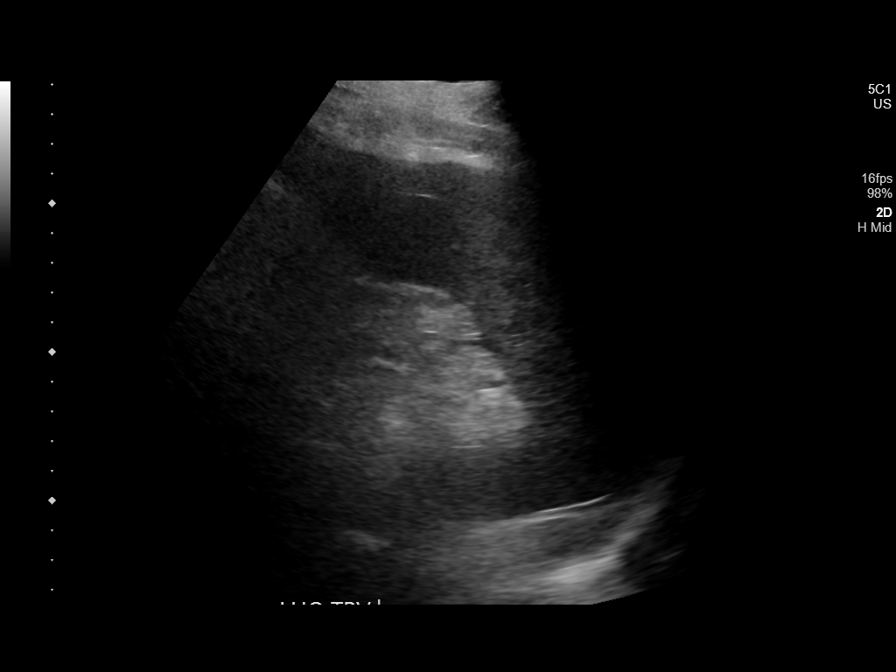

[8 of 8 positions shown; findings below may reference images not displayed]

FINDINGS: Small volume scattered residual/recurrent abdominal ascites. No
significant pocket for safe paracentesis.
IMPRESSION: Small volume residual/recurrent abdominal ascites. Paracentesis
deferred.

## 2023-04-12 IMAGING — US US ABDOMEN LIMITED RUQ/ASCITES
1 series · 4 of 4 positions shown · non-contrast
Comparison: None.

CLINICAL DATA: 64-year-old gentleman presented for paracentesis

EXAM:
LIMITED ABDOMEN ULTRASOUND FOR ASCITES
TECHNIQUE: Limited ultrasound survey for ascites was performed in all four
abdominal quadrants.

[Series 1: us paracentesis · 4 of 4 slices shown]
[im 1/4]
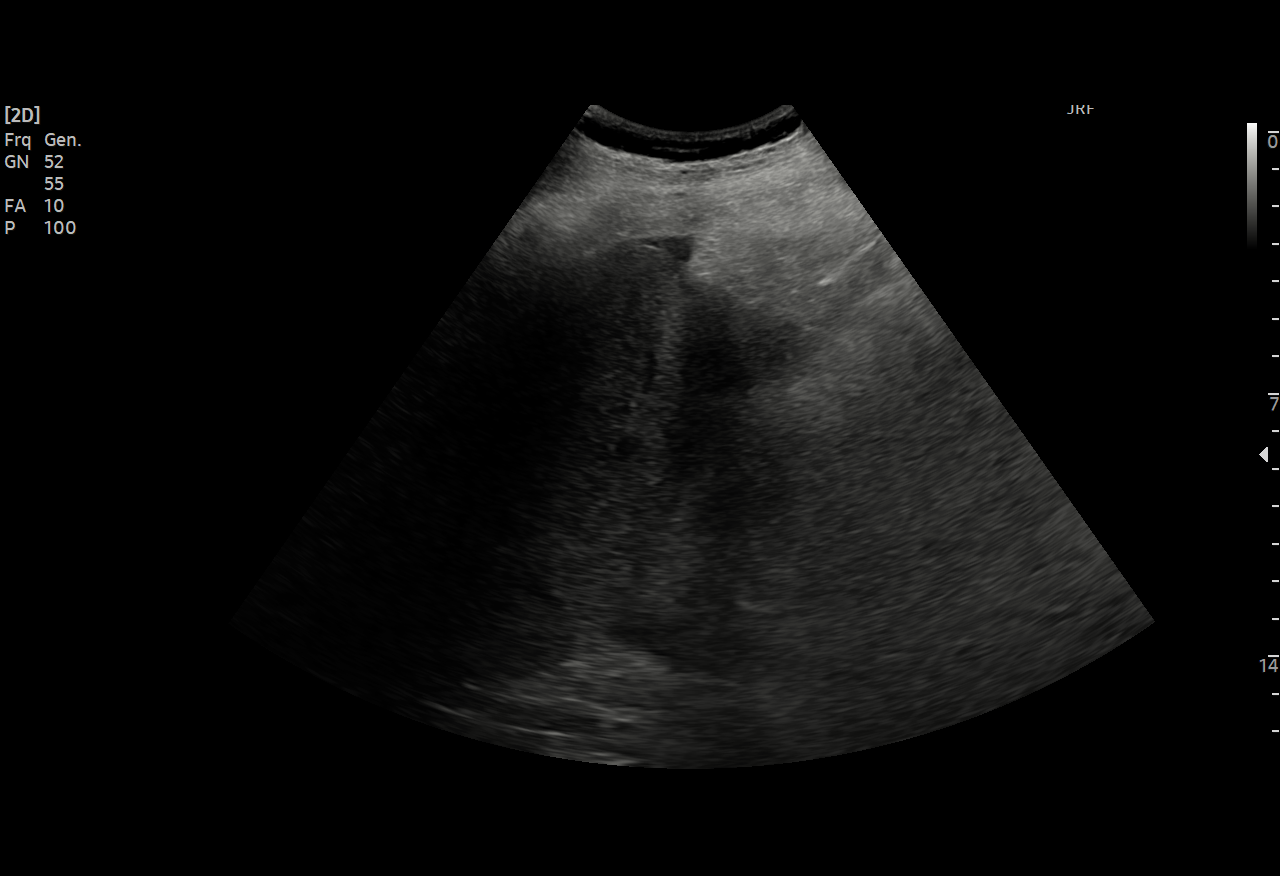
[im 2/4]
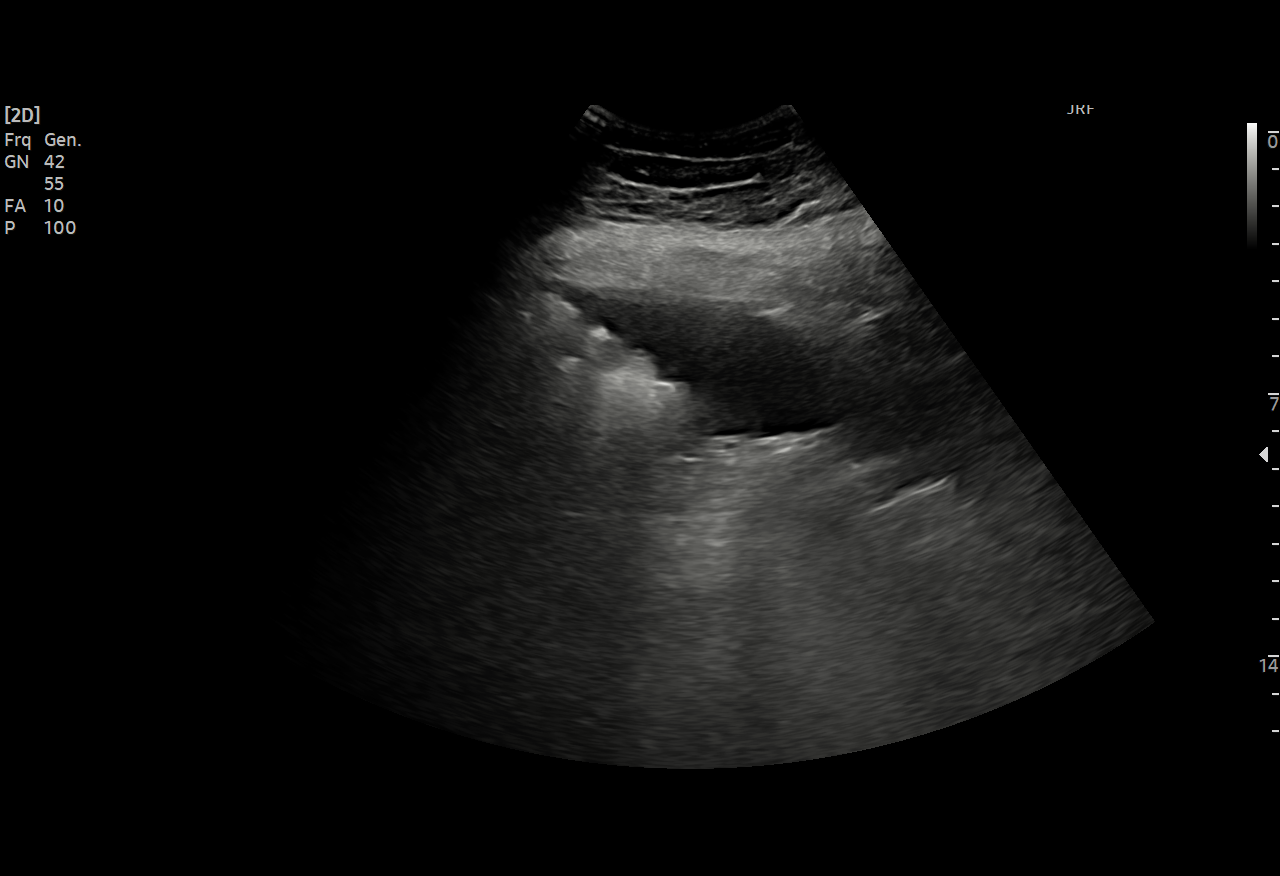
[im 3/4]
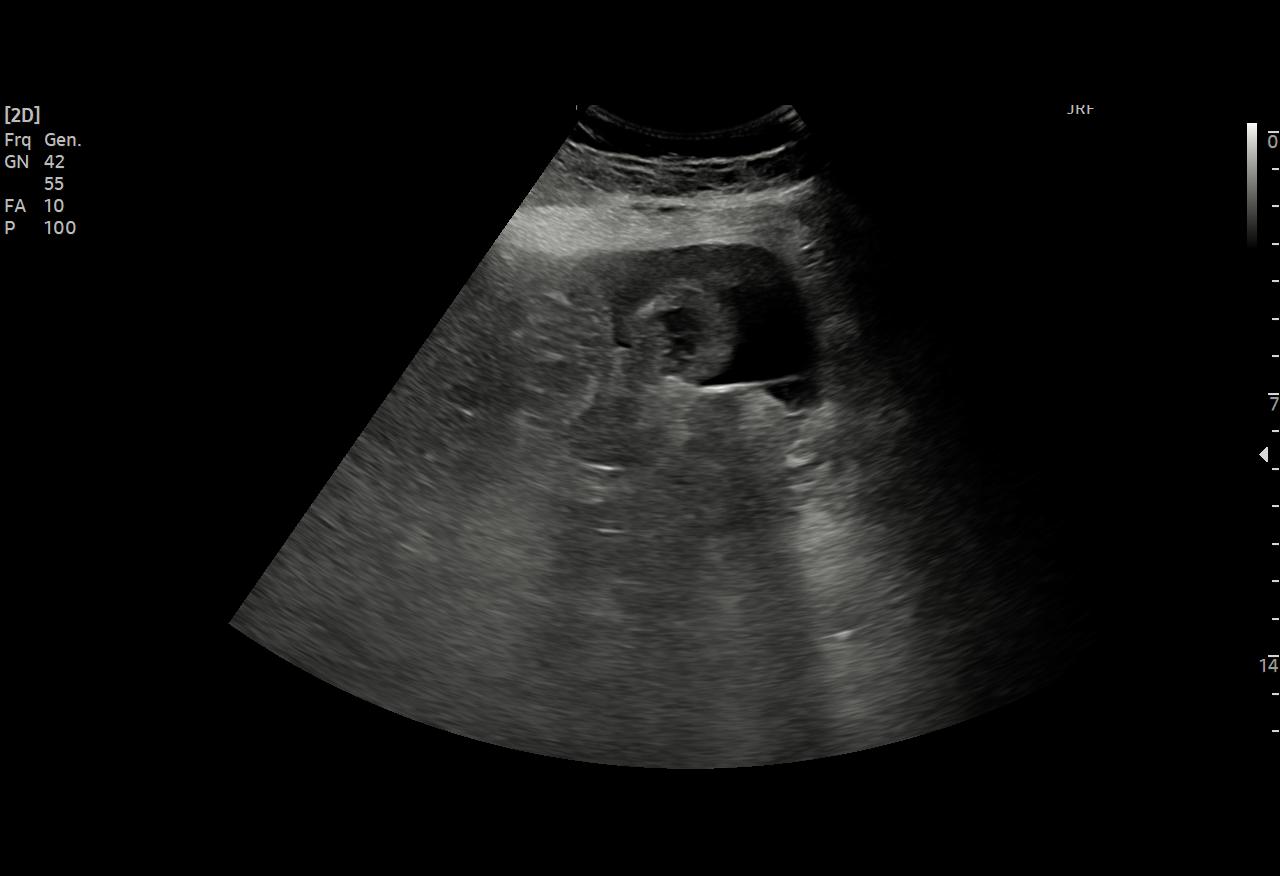
[im 4/4]
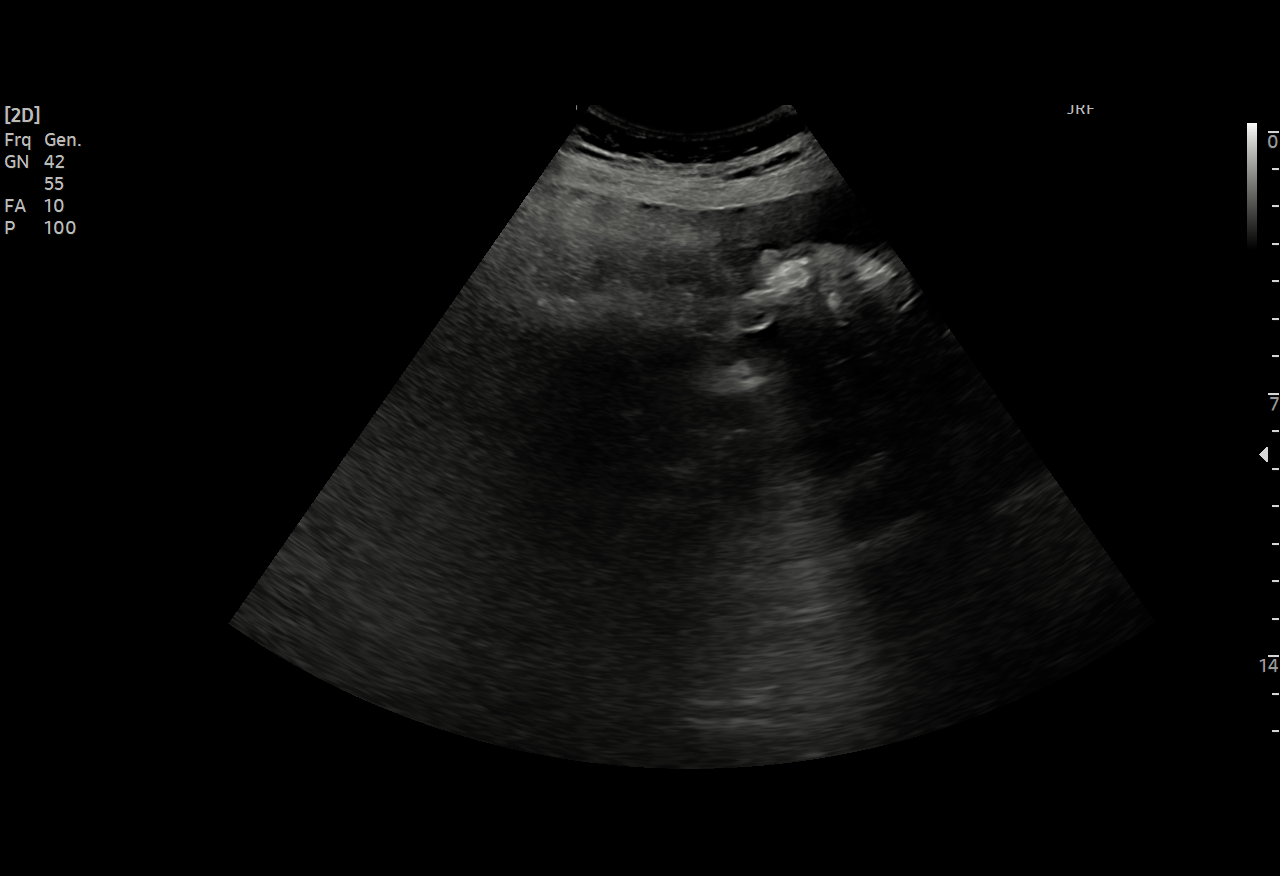

[4 of 4 positions shown; findings below may reference images not displayed]

FINDINGS: Minimal abdominal ascites insufficient for aspiration.
IMPRESSION: Minimal abdominal ascites, insufficient for paracentesis.

## 2023-05-26 IMAGING — US US RENAL
1 series · 14 of 25 positions shown · non-contrast
Comparison: July 19, 2020

CLINICAL DATA: AK I

EXAM:
RENAL / URINARY TRACT ULTRASOUND COMPLETE

[Series 1: us renal · 14 of 47 slices shown]
[im 1/47]
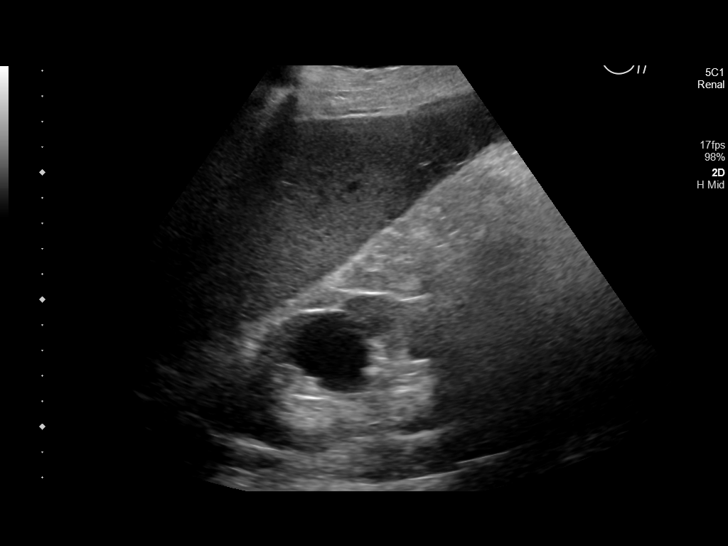
[im 4/47]
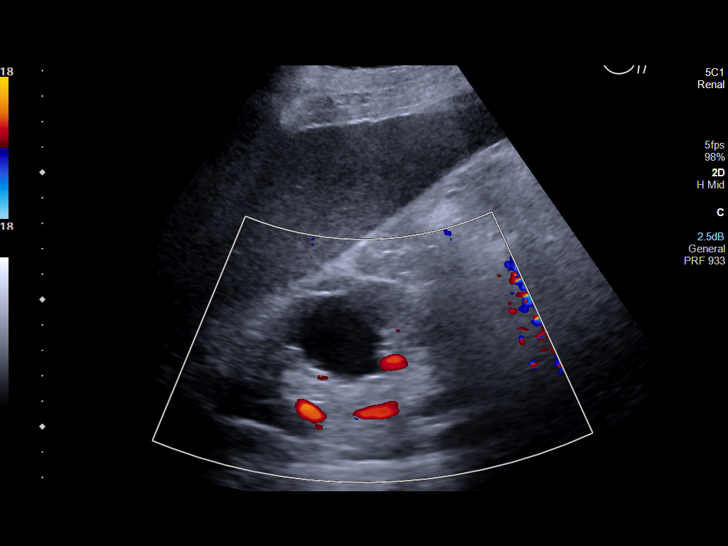
[im 8/47]
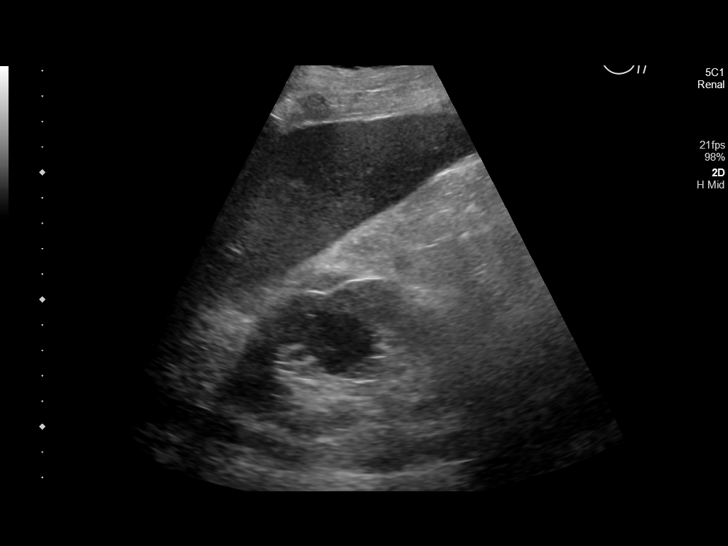
[im 12/47]
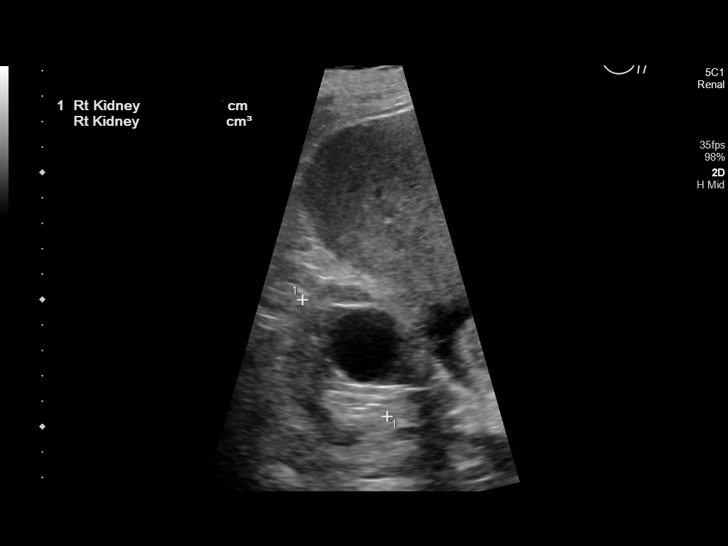
[im 16/47]
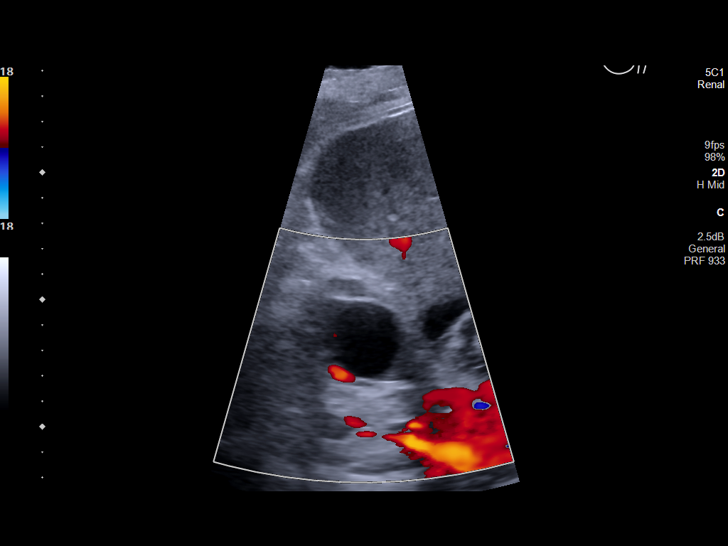
[im 18/47]
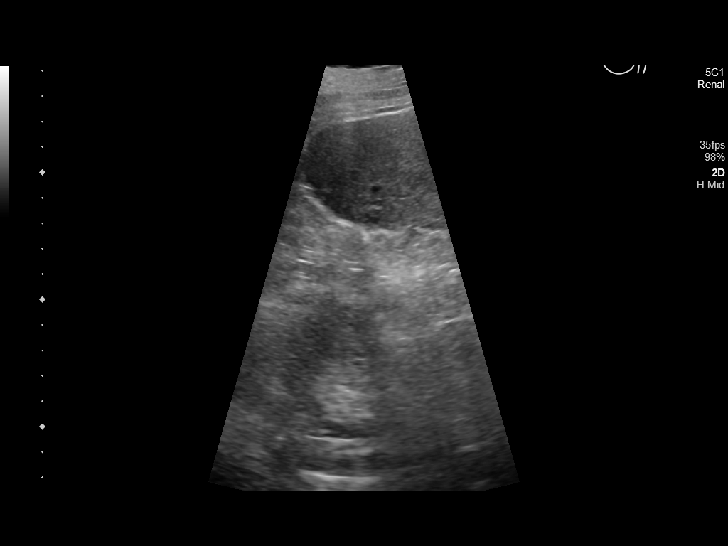
[im 22/47]
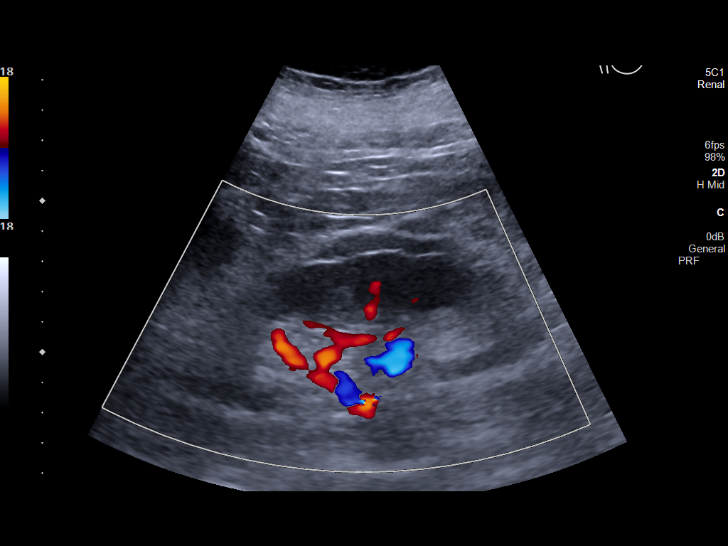
[im 25/47]
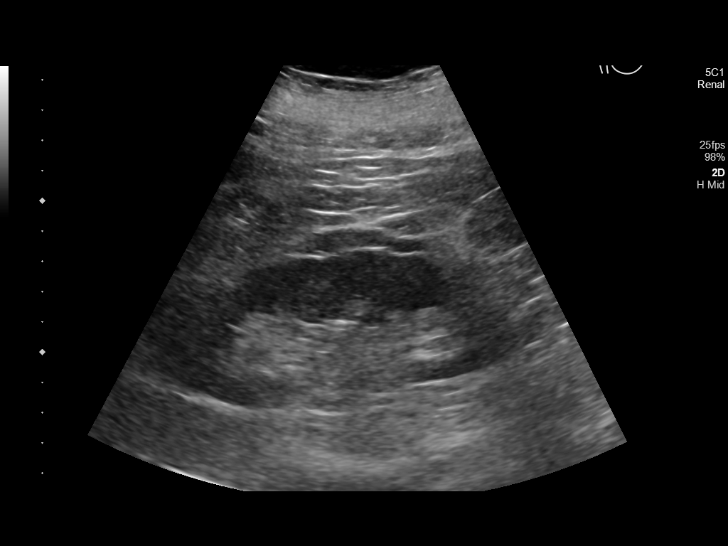
[im 29/47]
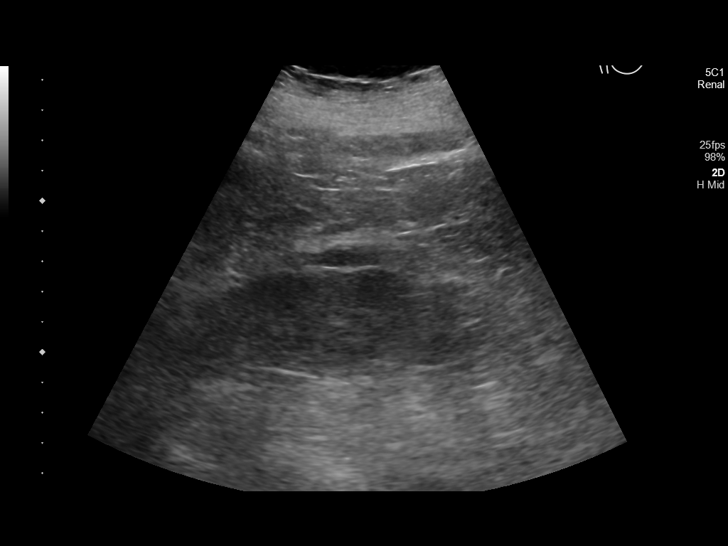
[im 31/47]
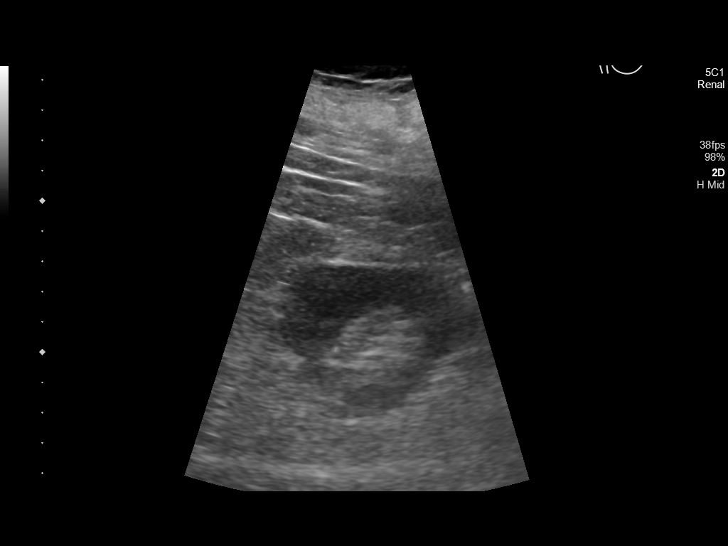
[im 35/47]
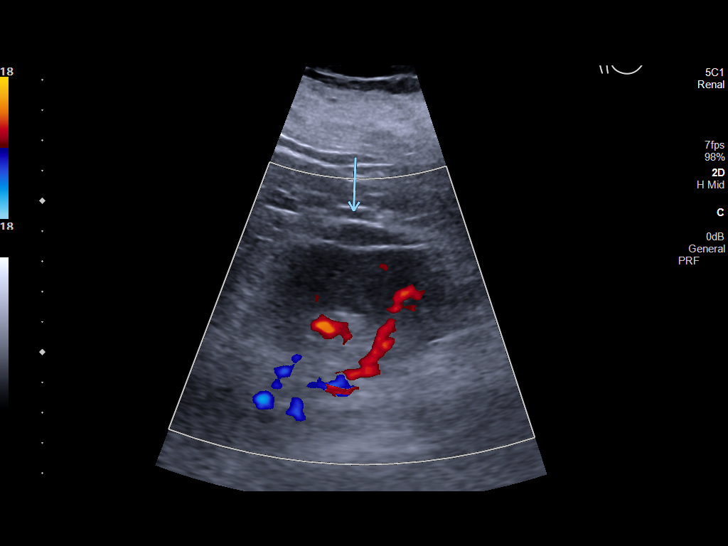
[im 39/47]
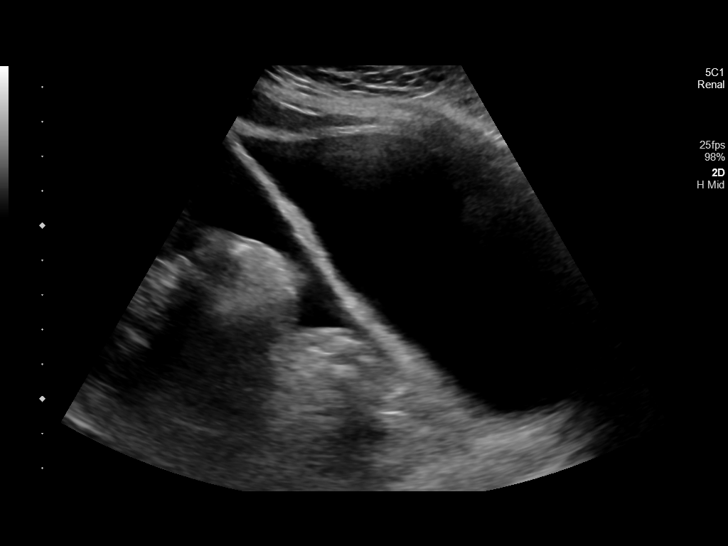
[im 43/47]
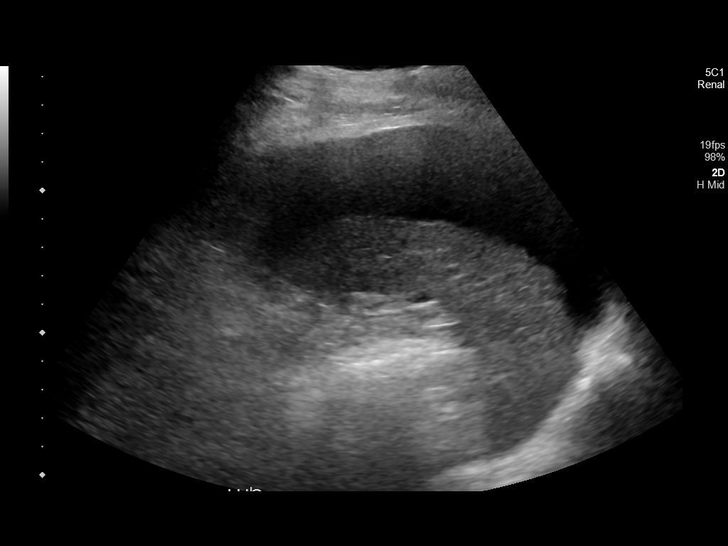
[im 47/47]
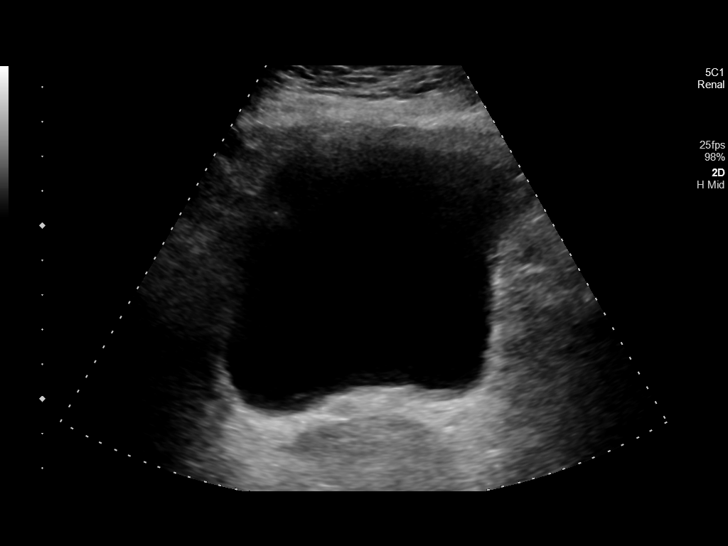

[14 of 25 positions shown; findings below may reference images not displayed]

FINDINGS: Right Kidney:

Renal measurements: 12.3 x 5.9 x 5.7 cm = volume: 215 mL.
Echogenicity is within normal limits. There is a anechoic mass with
posterior acoustic enhancement consistent with a benign cyst which
measures 2.9 x 3.3 x 3.0 cm. No hydronephrosis.

Left Kidney:

Renal measurements: 12.0 x 4.4 x 5.4 cm = volume: 150 mL.
Echogenicity is within normal limits. There is an echogenic focus
consistent with a nonobstructive nephrolithiasis vs calcification as
seen on prior CT. No hydronephrosis.

Bladder:

Appears normal for degree of bladder distention.

Other:

Incidental note of small to moderate volume ascites
IMPRESSION: 1. No hydronephrosis.
2. Small to moderate volume ascites.
